# Patient Record
Sex: Male | Born: 1970 | Race: White | Hispanic: No | Marital: Married | Smoking: Current every day smoker
Health system: Southern US, Community
[De-identification: ages and names within clinical notes are randomized; demographics above are authoritative.]

## PROBLEM LIST (undated history)

## (undated) DIAGNOSIS — Z87442 Personal history of urinary calculi: Secondary | ICD-10-CM

## (undated) DIAGNOSIS — E7800 Pure hypercholesterolemia, unspecified: Secondary | ICD-10-CM

## (undated) HISTORY — DX: Pure hypercholesterolemia, unspecified: E78.00

## (undated) HISTORY — PX: CHOLECYSTECTOMY: SHX55

## (undated) MED FILL — Dexamethasone Sodium Phosphate Inj 100 MG/10ML: INTRAMUSCULAR | Qty: 1 | Status: AC

---

## 2004-08-17 ENCOUNTER — Emergency Department (HOSPITAL_COMMUNITY): Admission: EM | Admit: 2004-08-17 | Discharge: 2004-08-17 | Payer: Self-pay | Admitting: Family Medicine

## 2005-11-20 ENCOUNTER — Ambulatory Visit: Payer: Self-pay | Admitting: Endocrinology

## 2006-04-07 ENCOUNTER — Emergency Department (HOSPITAL_COMMUNITY): Admission: EM | Admit: 2006-04-07 | Discharge: 2006-04-07 | Payer: Self-pay | Admitting: Emergency Medicine

## 2006-08-19 ENCOUNTER — Emergency Department (HOSPITAL_COMMUNITY): Admission: EM | Admit: 2006-08-19 | Discharge: 2006-08-19 | Payer: Self-pay | Admitting: Emergency Medicine

## 2006-11-05 ENCOUNTER — Ambulatory Visit: Payer: Self-pay | Admitting: Internal Medicine

## 2007-07-15 ENCOUNTER — Encounter: Payer: Self-pay | Admitting: *Deleted

## 2007-07-15 DIAGNOSIS — Z720 Tobacco use: Secondary | ICD-10-CM | POA: Insufficient documentation

## 2007-07-15 DIAGNOSIS — F17200 Nicotine dependence, unspecified, uncomplicated: Secondary | ICD-10-CM

## 2007-07-15 DIAGNOSIS — E785 Hyperlipidemia, unspecified: Secondary | ICD-10-CM | POA: Insufficient documentation

## 2007-11-08 ENCOUNTER — Ambulatory Visit: Payer: Self-pay | Admitting: Internal Medicine

## 2007-11-08 DIAGNOSIS — F411 Generalized anxiety disorder: Secondary | ICD-10-CM | POA: Insufficient documentation

## 2007-11-11 LAB — CONVERTED CEMR LAB
ALT: 28 units/L (ref 0–53)
AST: 17 units/L (ref 0–37)
Albumin: 4.4 g/dL (ref 3.5–5.2)
Alkaline Phosphatase: 99 units/L (ref 39–117)
BUN: 9 mg/dL (ref 6–23)
Basophils Absolute: 0 10*3/uL (ref 0.0–0.1)
Basophils Relative: 0.1 % (ref 0.0–1.0)
Bilirubin Urine: NEGATIVE
Bilirubin, Direct: 0.1 mg/dL (ref 0.0–0.3)
CO2: 31 meq/L (ref 19–32)
Calcium: 10 mg/dL (ref 8.4–10.5)
Chloride: 103 meq/L (ref 96–112)
Cholesterol: 219 mg/dL (ref 0–200)
Creatinine, Ser: 0.9 mg/dL (ref 0.4–1.5)
Direct LDL: 153.9 mg/dL
Eosinophils Absolute: 0.6 10*3/uL (ref 0.0–0.6)
Eosinophils Relative: 5 % (ref 0.0–5.0)
GFR calc Af Amer: 123 mL/min
GFR calc non Af Amer: 102 mL/min
Glucose, Bld: 95 mg/dL (ref 70–99)
HCT: 47.5 % (ref 39.0–52.0)
HDL: 33.1 mg/dL — ABNORMAL LOW (ref >39.0)
Hemoglobin, Urine: NEGATIVE
Hemoglobin: 16.9 g/dL (ref 13.0–17.0)
Ketones, ur: NEGATIVE mg/dL
Leukocytes, UA: NEGATIVE
Lymphocytes Relative: 29.2 % (ref 12.0–46.0)
MCHC: 35.5 g/dL (ref 30.0–36.0)
MCV: 87.2 fL (ref 78.0–100.0)
Monocytes Absolute: 0.9 10*3/uL — ABNORMAL HIGH (ref 0.2–0.7)
Monocytes Relative: 7.6 % (ref 3.0–11.0)
Neutro Abs: 6.6 10*3/uL (ref 1.4–7.7)
Neutrophils Relative %: 58.1 % (ref 43.0–77.0)
Nitrite: NEGATIVE
Platelets: 204 10*3/uL (ref 150–400)
Potassium: 3.9 meq/L (ref 3.5–5.1)
RBC: 5.45 M/uL (ref 4.22–5.81)
RDW: 12.6 % (ref 11.5–14.6)
Sodium: 141 meq/L (ref 135–145)
Specific Gravity, Urine: 1.01 (ref 1.000–1.03)
TSH: 1.54 microintl units/mL (ref 0.35–5.50)
Total Bilirubin: 0.8 mg/dL (ref 0.3–1.2)
Total CHOL/HDL Ratio: 6.6
Total Protein, Urine: NEGATIVE mg/dL
Total Protein: 7.8 g/dL (ref 6.0–8.3)
Triglycerides: 263 mg/dL (ref 0–149)
Urine Glucose: NEGATIVE mg/dL
Urobilinogen, UA: 0.2 (ref 0.0–1.0)
VLDL: 53 mg/dL — ABNORMAL HIGH (ref 0–40)
WBC: 11.4 10*3/uL — ABNORMAL HIGH (ref 4.5–10.5)
pH: 7 (ref 5.0–8.0)

## 2008-01-12 ENCOUNTER — Emergency Department (HOSPITAL_COMMUNITY): Admission: EM | Admit: 2008-01-12 | Discharge: 2008-01-12 | Payer: Self-pay | Admitting: Emergency Medicine

## 2008-01-15 ENCOUNTER — Inpatient Hospital Stay (HOSPITAL_COMMUNITY): Admission: AD | Admit: 2008-01-15 | Discharge: 2008-01-17 | Payer: Self-pay | Admitting: Internal Medicine

## 2008-01-15 ENCOUNTER — Ambulatory Visit: Payer: Self-pay | Admitting: Endocrinology

## 2008-01-15 DIAGNOSIS — L03211 Cellulitis of face: Secondary | ICD-10-CM | POA: Insufficient documentation

## 2008-01-15 DIAGNOSIS — L0201 Cutaneous abscess of face: Secondary | ICD-10-CM

## 2008-01-16 ENCOUNTER — Ambulatory Visit: Payer: Self-pay | Admitting: Internal Medicine

## 2008-01-20 ENCOUNTER — Encounter: Payer: Self-pay | Admitting: Endocrinology

## 2009-04-05 ENCOUNTER — Emergency Department (HOSPITAL_COMMUNITY): Admission: EM | Admit: 2009-04-05 | Discharge: 2009-04-05 | Payer: Self-pay | Admitting: *Deleted

## 2010-08-26 ENCOUNTER — Ambulatory Visit: Payer: Self-pay | Admitting: Endocrinology

## 2010-08-26 DIAGNOSIS — L738 Other specified follicular disorders: Secondary | ICD-10-CM

## 2010-11-24 NOTE — Assessment & Plan Note (Signed)
Summary: LAST VISIT:  2009--RASH OR PLACE ON NECK--STC   Vital Signs:  Patient profile:   40 year old male Height:      67 inches (170.18 cm) Weight:      136.50 pounds (62.05 kg) BMI:     21.46 O2 Sat:      97 % on Room air Temp:     98.3 degrees F (36.83 degrees C) oral Pulse rate:   74 / minute BP sitting:   118 / 80  (left arm) Cuff size:   regular  Vitals Entered By: Brenton Grills CMA Duncan Dull) (August 26, 2010 10:49 AM)  O2 Flow:  Room air CC: Rash on neck/pt is not currently taking any medications/aj Is Patient Diabetic? No   CC:  Rash on neck/pt is not currently taking any medications/aj.  History of Present Illness: pt states 3 mos of slight rash at the left neck, and assoc itching.    Current Medications (verified): 1)  Paroxetine Hcl 40 Mg  Tabs (Paroxetine Hcl) .Marland Kitchen.. 1 By Mouth Qd 2)  Zocor 80 Mg Tabs (Simvastatin) .Marland Kitchen.. 1 By Mouth Qd 3)  Doxycycline Hyclate 100 Mg  Tabs (Doxycycline Hyclate) .Marland Kitchen.. 1 By Mouth Qd 4)  Sulfamethoxazole-Trimethoprim 800-160 Mg/84ml  Susp (Sulfamethoxazole-Trimethoprim) .... Take 2 Q 12 Hours 5)  Ibuprofen 800 Mg  Tabs (Ibuprofen) .... Take 1 Three Times A Day Qd 6)  Propoxyphene N-Apap 100-650 Mg  Tabs (Propoxyphene N-Apap) .... Take 1 Q 6 Hours  Allergies (verified): 1)  ! Codeine  Past History:  Past Medical History: Last updated: 11/08/2007 Hyperlipidemia Anxiety chronic folliculitis  Social History: Reviewed history from 01/15/2008 and no changes required. Current Smoker Alcohol use-no married works Holiday representative  Review of Systems  The patient denies fever.    Physical Exam  General:  normal appearance.   Neck:  no masses, thyromegaly, or abnormal cervical nodes.  nontender Skin:  mild folliculitis at the left neck.     Impression & Recommendations:  Problem # 1:  FOLLICULITIS (ICD-704.8) mild recurrence  Problem # 2:  SMOKER (ICD-305.1) he would like to quit  Problem # 3:  HYPERLIPIDEMIA  (ICD-272.4) off his meds now  Other Orders: Est. Patient Level II (56213)  Patient Instructions: 1)  resume doxycycline 100 mg once daily.  you should take this indefinitely.   2)  please consider using a quit-smoking product of your choice, and let me know which you would like a prescription for.   it also helps to call (800) quit now.   3)  you should consider resuming a cholesterol medication, and call if you would like a prescription.   4)  return here as needed Prescriptions: DOXYCYCLINE HYCLATE 100 MG  TABS (DOXYCYCLINE HYCLATE) 1 by mouth qd  #60 x 5   Entered and Authorized by:   Minus Breeding MD   Signed by:   Minus Breeding MD on 08/26/2010   Method used:   Electronically to        [facility] (retail)       [address]       [place], [place]  [address]       Ph: [phone]       Fax: [phone]   RxID:   [id number]    Orders Added: 1)  Est. Patient Level II [56387]

## 2011-01-30 LAB — POCT I-STAT, CHEM 8
BUN: 11 mg/dL (ref 6–23)
Calcium, Ion: 1.1 mmol/L — ABNORMAL LOW (ref 1.12–1.32)
Chloride: 106 mEq/L (ref 96–112)
Creatinine, Ser: 1 mg/dL (ref 0.4–1.5)
Glucose, Bld: 103 mg/dL — ABNORMAL HIGH (ref 70–99)
HCT: 49 % (ref 39.0–52.0)
Hemoglobin: 16.7 g/dL (ref 13.0–17.0)
Potassium: 3.5 mEq/L (ref 3.5–5.1)
Sodium: 137 mEq/L (ref 135–145)
TCO2: 22 mmol/L (ref 0–100)

## 2011-01-30 LAB — URINALYSIS, ROUTINE W REFLEX MICROSCOPIC
Bilirubin Urine: NEGATIVE
Glucose, UA: NEGATIVE mg/dL
Hgb urine dipstick: NEGATIVE
Ketones, ur: NEGATIVE mg/dL
Nitrite: NEGATIVE
Protein, ur: NEGATIVE mg/dL
Specific Gravity, Urine: 1.008 (ref 1.005–1.030)
Urobilinogen, UA: 1 mg/dL (ref 0.0–1.0)
pH: 6.5 (ref 5.0–8.0)

## 2011-03-07 NOTE — Discharge Summary (Signed)
Jose Morse, Jose Morse              ACCOUNT NO.:  [id number]   MEDICAL RECORD NO.:  [id number]          PATIENT TYPE:  INP   LOCATION:  1516                         FACILITY:  [facility]   PHYSICIAN:  Rosalyn Gess. Norins, MD  DATE OF BIRTH:  28-Mar-1971   DATE OF ADMISSION:  01/15/2008  DATE OF DISCHARGE:  01/17/2008                               DISCHARGE SUMMARY   ADMISSION DIAGNOSIS:  Facial cellulitis   DISCHARGE DIAGNOSIS:  Facial cellulitis.   CONSULTANTS:  Dr. Annalee Genta per ENT.   PROCEDURE:  Incision and drainage of the facial abscess by Dr.  Annalee Genta.   HISTORY OF PRESENT ILLNESS:  The patient is a 40 year old gentleman who  presented to the office to see Dr. Romero Belling with a 7 day history of  painful nodule to the left malar area.  He had been seen twice by ER  staff both at [facility] and [facility], but had failed oral  antibiotics.  Because of his ongoing pain, discomfort, swelling and  infection, he was admitted to hospital.   Please see the H&P for past medical history, family history and social  history.   HOSPITAL COURSE:  The patient was admitted to the hospital and started  on unison IV.  He was seen in consultation by Dr. Annalee Genta for ENT.  A  bedside incision and drainage was performed.  A wick was placed into the  wound.  The consultant recommended switching the patient to Augmentin XR  and complete outpatient antibiotics.  He is to see the patient in his  office on Monday January 20, 2008.  Patient is to call for appointment.   The patient is being treated with his being afebrile, with his white  count having returned to normal level of 9100, he is felt to be stable  to continue oral antibiotics at home and follow up with ENT as noted.   PHYSICAL EXAMINATION:  VITAL SIGNS:  Temperature 98.1, blood pressure  was 112/73, heart rate 68, respirations 16.  GENERAL APPEARANCE:  This is a slender gentleman in no acute distress.  DERM:  The patient has  a wound on the left malar area with packing in  place.  There is a significant amount of surrounding swelling.  There is  no involvement of the eye structures.  No further examination conducted.   LABORATORY DATA:  CBC from the day of discharge with hemoglobin 13.6 gm,  white count was 9100, platelet count 192,000, TSH was 1.77.  Abscess  cultures were pending at time of this dictation with no growth at 1 day.   DISPOSITION:  The patient is discharged home.   DISCHARGE MEDICATIONS:  1. Augmentin XR b.i.d. for 10 days.  2. He will resume his home medications including Zocor 80 mg daily.  3. Paxil 40 mg daily.  4. Darvocet-N 100 every 6 hours as needed for pain.  5. We will discontinue doxycycline and sulfamethoxazole trimethoprim.   CONDITION ON DISCHARGE:  Stable and improved.      Rosalyn Gess Norins, MD  Electronically Signed     MEN/MEDQ  D:  01/17/2008  T:  01/18/2008  Job:  [id number]   cc:   Gregary Signs A. Everardo All, MD  [address]  [place]  [place] [address]   Kinnie Scales. Annalee Genta, M.D.  Fax: [phone]

## 2011-07-17 LAB — CBC
HCT: 38.7 — ABNORMAL LOW
HCT: 41.2
Hemoglobin: 13.6
Hemoglobin: 14.2
MCHC: 34.5
MCHC: 35.1
MCV: 86.5
MCV: 86.5
Platelets: 179
Platelets: 192
RBC: 4.47
RBC: 4.77
RDW: 13.4
RDW: 13.6
WBC: 13.1 — ABNORMAL HIGH
WBC: 9.1

## 2011-07-17 LAB — ANAEROBIC CULTURE: Gram Stain: NONE SEEN

## 2011-07-17 LAB — COMPREHENSIVE METABOLIC PANEL
ALT: 36
AST: 35
Albumin: 3.7
Alkaline Phosphatase: 100
BUN: 4 — ABNORMAL LOW
CO2: 25
Calcium: 9.3
Chloride: 106
Creatinine, Ser: 0.91
GFR calc Af Amer: >60
GFR calc non Af Amer: >60
Glucose, Bld: 107 — ABNORMAL HIGH
Potassium: 3.5
Sodium: 138
Total Bilirubin: 0.6
Total Protein: 7.2

## 2011-07-17 LAB — URINALYSIS, ROUTINE W REFLEX MICROSCOPIC
Bilirubin Urine: NEGATIVE
Glucose, UA: NEGATIVE
Hgb urine dipstick: NEGATIVE
Ketones, ur: NEGATIVE
Nitrite: NEGATIVE
Protein, ur: NEGATIVE
Specific Gravity, Urine: 1.015
Urobilinogen, UA: 1
pH: 6.5

## 2011-07-17 LAB — CULTURE, ROUTINE-ABSCESS
Culture: NO GROWTH
Gram Stain: NONE SEEN

## 2011-07-17 LAB — CULTURE, BLOOD (ROUTINE X 2)
Culture: NO GROWTH
Culture: NO GROWTH

## 2011-07-17 LAB — TSH: TSH: 1.771

## 2014-12-24 ENCOUNTER — Emergency Department: Payer: Self-pay | Admitting: Emergency Medicine

## 2014-12-31 ENCOUNTER — Ambulatory Visit: Payer: Self-pay | Admitting: Surgery

## 2015-02-15 LAB — SURGICAL PATHOLOGY

## 2015-02-21 NOTE — Op Note (Signed)
PATIENT NAME:  Jose Morse, Jose Morse MR#:  [id number] DATE OF BIRTH:  1971/07/20  DATE OF PROCEDURE:  12/31/2014  PREOPERATIVE DIAGNOSIS: Acute cholecystitis.   POSTOPERATIVE DIAGNOSIS:  Acute cholecystitis.  OPERATION: Laparoscopic cholecystectomy with cholangiography.   SURGEON: Rodena Goldmann, III, MD  ANESTHESIA: General.   OPERATIVE PROCEDURE: With the patient in the supine position after the induction of appropriate general anesthesia, the patient's abdomen was prepped with ChloraPrep and draped with sterile towels. The patient was placed in a head down, feet up position. A small infraumbilical incision was made in the standard fashion. The incision was carried down bluntly through the subcutaneous tissue. A Veress needle was used to cannulate the peritoneal cavity. CO2 was insufflated to appropriate pressure measurements. When approximately 2.5 liters of CO2 were instilled, the Veress needle was withdrawn. An 11 millimeter [employer] port was inserted into the peritoneal cavity. Intraperitoneal position was confirmed. CO2 was re-insufflated. The patient was placed in the head up, feet down position and rotated slightly to the left side. A subxiphoid transverse incision was made and an 11 millimeter port  inserted under direct vision. Two lateral ports, 5 millimeters in size, were inserted under direct vision. The gallbladder was markedly inflamed, thickened, edematous and erythematous. It was elevated superiorly and laterally. An attempt was made to aspirate some bile but there were so many stones and the bile was so thick that I could not adequately aspirate the gallbladder. Dissection was carried out along the hepatoduodenal ligament. The cystic artery and cystic duct were identified. The cystic duct was clipped on the gallbladder side and opened. An on table cholangiogram using dynamic fluoroscopy revealed free flow of dye into the duodenum. No obstruction was seen. Intrahepatic radicles were  visualized. The gallbladder was then doubly clipped on the common duct side and divided. The cystic artery was doubly clipped and divided. The gallbladder was then dissected free from its bed and delivered using hook and the cautery apparatus. There was some spilled bile from the area of attempted aspiration. The gallbladder was captured in EndoCatch apparatus and removed through the subxiphoid incision. The area was irrigated with 2 liters of warm saline solution. Because of the spilled bile, I elected to place a JP drain through a separate stab wound brought out through one of the port sites and placed in the bed of the liver. Secured with 3-0 nylon. The upper midline fascia was closed with figure-of-eight suture of 0 Vicryl using the suture passer. The abdomen was then irrigated and desufflated. Skin incisions were closed with 5-0 nylon. The area was infiltrated with 0.25% Marcaine for postoperative pain control. Sterile dressings were applied. The patient was returned to the recovery room having tolerated the procedure well. Sponge, instrument, and needle count were correct x 2 in the operating room.     ____________________________ Micheline Maze, MD rle:tr D: 12/31/2014 15:52:29 ET T: 12/31/2014 16:02:13 ET JOB#: [id number]  cc: Rodena Goldmann III, MD, <Dictator> Rodena Goldmann MD ELECTRONICALLY SIGNED 01/01/2015 19:27

## 2015-02-21 NOTE — Discharge Summary (Signed)
PATIENT NAME:  Jose Morse, Jose Morse MR#:  [id number] DATE OF BIRTH:  [birth year]  DATE OF ADMISSION:  12/31/2014 DATE OF DISCHARGE:  01/02/2015  BRIEF HISTORY: Jose Morse is a 44 year old gentleman seen in the office with signs and symptoms consistent with acute cholecystitis. He had been evaluated in the Emergency Room and referred to the office for further intervention. After evaluating his clinical presentation and his laboratory workup with his imaging, we recommended surgical intervention. He was admitted to the hospital through the Operating Room on the morning of 12/31/2014 for an elective cholecystectomy. Laparoscopic procedure was accomplished without difficulty. However, he did have a markedly inflamed gallbladder. We left a drain because of the significant inflammatory changes. We admitted him to observation. He had very slow pain control. He had mild nausea, but no significant vomiting. By 01/01/15 he is up, active, tolerating a liquid diet, advanced to soft diet on the morning of 01/02/2015. His wounds look good. There is no sign of any infection. Drainage has decreased. His JP drain was removed. Discharged home today to be followed in the office in 7 to 10 days' time. Bathing, activity, and driving instructions were given to the patient.   DISCHARGE MEDICATIONS: Include Percocet 5/325 every 4 to 6 hours p.r.n. pain.   FINAL DISCHARGE DIAGNOSIS: Acute cholecystitis.   SURGERY: Laparoscopic cholecystectomy.   ____________________________ Rodena Goldmann III, MD rle:ap D: 01/02/2015 06:42:37 ET T: 01/02/2015 16:48:01 ET JOB#: [id number]  cc: Rodena Goldmann III, MD, <Dictator> Rodena Goldmann MD ELECTRONICALLY SIGNED 01/02/2015 19:01

## 2016-01-18 ENCOUNTER — Encounter: Payer: Self-pay | Admitting: Emergency Medicine

## 2016-01-18 ENCOUNTER — Emergency Department
Admission: EM | Admit: 2016-01-18 | Discharge: 2016-01-18 | Disposition: A | Discharge status: Home / Self Care | Payer: Commercial Managed Care - HMO | Attending: Emergency Medicine | Admitting: Emergency Medicine

## 2016-01-18 DIAGNOSIS — F17210 Nicotine dependence, cigarettes, uncomplicated: Secondary | ICD-10-CM | POA: Diagnosis not present

## 2016-01-18 DIAGNOSIS — E785 Hyperlipidemia, unspecified: Secondary | ICD-10-CM | POA: Insufficient documentation

## 2016-01-18 DIAGNOSIS — L0231 Cutaneous abscess of buttock: Secondary | ICD-10-CM | POA: Insufficient documentation

## 2016-01-18 MED ORDER — OXYCODONE HCL 5 MG PO TABS
10.0000 mg | ORAL_TABLET | Freq: Once | ORAL | Status: AC
  Administered 2016-01-18: 10 mg via ORAL
  Filled 2016-01-18: qty 2

## 2016-01-18 MED ORDER — ONDANSETRON 4 MG PO TBDP
4.0000 mg | ORAL_TABLET | Freq: Once | ORAL | Status: AC
  Administered 2016-01-18: 4 mg via ORAL
  Filled 2016-01-18: qty 1

## 2016-01-18 MED ORDER — SULFAMETHOXAZOLE-TRIMETHOPRIM 800-160 MG PO TABS
2.0000 | ORAL_TABLET | Freq: Once | ORAL | Status: AC
  Administered 2016-01-18: 2 via ORAL
  Filled 2016-01-18: qty 2

## 2016-01-18 MED ORDER — CEPHALEXIN 500 MG PO CAPS: 500.0000 mg | ORAL_CAPSULE | Freq: Four times a day (QID) | ORAL | Status: AC

## 2016-01-18 MED ORDER — CEPHALEXIN 500 MG PO CAPS
500.0000 mg | ORAL_CAPSULE | Freq: Once | ORAL | Status: AC
  Administered 2016-01-18: 500 mg via ORAL
  Filled 2016-01-18: qty 1

## 2016-01-18 MED ORDER — HYDROCODONE-ACETAMINOPHEN 5-325 MG PO TABS: 1.0000 | ORAL_TABLET | ORAL | Status: DC | PRN

## 2016-01-18 MED ORDER — SULFAMETHOXAZOLE-TRIMETHOPRIM 800-160 MG PO TABS: 2.0000 | ORAL_TABLET | Freq: Two times a day (BID) | ORAL | Status: DC

## 2016-01-18 MED ORDER — LIDOCAINE-EPINEPHRINE (PF) 1 %-1:200000 IJ SOLN
30.0000 mL | Freq: Once | INTRAMUSCULAR | Status: AC
  Administered 2016-01-18: 30 mL
  Filled 2016-01-18: qty 30

## 2016-01-18 NOTE — Discharge Instructions (Signed)
Return in 2 days for packing removal and recheck of abscess. Use warm compresses 4 times per day to encourage drainage.

## 2016-01-18 NOTE — ED Provider Notes (Signed)
[facility] Emergency Department Provider Note ____________________________________________  Time seen: Approximately 8:35 PM  I have reviewed the triage vital signs and the nursing notes.   HISTORY  Chief Complaint Abscess  HPI Jose Morse is a 45 y.o. male who presents to the emergency department for evaluation abscess to the head is been present off-and-on for the past couple weeks. He states that it will drain and then return to them before. He has been taking Tylenol intermittently with some relief.   History reviewed. No pertinent past medical history.  Patient Active Problem List   Diagnosis Date Noted  . FOLLICULITIS 14/48/1856  . CELLULITIS AND ABSCESS OF FACE 01/15/2008  . ANXIETY 11/08/2007  . HYPERLIPIDEMIA 07/15/2007  . SMOKER 07/15/2007    Past Surgical History  Procedure Laterality Date  . Cholecystectomy      Current Outpatient Rx  Name  Route  Sig  Dispense  Refill  . cephALEXin (KEFLEX) 500 MG capsule   Oral   Take 1 capsule (500 mg total) by mouth 4 (four) times daily.   40 capsule   0   . HYDROcodone-acetaminophen (NORCO/VICODIN) 5-325 MG tablet   Oral   Take 1 tablet by mouth every 4 (four) hours as needed for moderate pain.   20 tablet   0   . sulfamethoxazole-trimethoprim (BACTRIM DS,SEPTRA DS) 800-160 MG tablet   Oral   Take 2 tablets by mouth 2 (two) times daily.   40 tablet   0     Allergies Codeine  No family history on file.  Social History Social History  Substance Use Topics  . Smoking status: Current Every Day Smoker -- 1.00 packs/day    Types: Cigarettes  . Smokeless tobacco: None  . Alcohol Use: No    Review of Systems   Constitutional: No fever/chills Gastrointestinal: No abdominal pain.  No nausea, no vomiting.  No diarrhea. Genitourinary: Negative for dysuria. Musculoskeletal: Negative for pain. Skin: Positive for abscess to the right buttock Neurological: Negative for  headaches, focal weakness or numbness.  ____________________________________________   PHYSICAL EXAM:  VITAL SIGNS: ED Triage Vitals  Enc Vitals Group     BP 01/18/16 2013 134/81 mmHg     Pulse Rate 01/18/16 2013 102     Resp 01/18/16 2013 20     Temp 01/18/16 2013 98.5 F (36.9 C)     Temp Source 01/18/16 2013 Oral     SpO2 01/18/16 2013 100 %     Weight 01/18/16 2013 145 lb (65.772 kg)     Height 01/18/16 2013 '5\' 7"'$  (1.702 m)     Head Cir --      Peak Flow --      Pain Score 01/18/16 2013 7     Pain Loc --      Pain Edu? --      Excl. in [place]? --     Constitutional: Alert and oriented. Well appearing and in no acute distress. Eyes: Conjunctivae are normal. EOMI. Mouth/Throat: Mucous membranes are moist. Respiratory: Normal respiratory effort.  No retractions. Gastrointestinal: Soft and nontender. No distention.  Musculoskeletal: Active ROM x 4 extremities observed. Neurologic:  Normal speech and language. No gross focal neurologic deficits are appreciated. Speech is normal. No gait instability. Skin:  Large, indurated and fluctuant abscess to right buttock;  Psychiatric: Mood and affect are normal. Speech and behavior are normal.  ____________________________________________   LABS (all labs ordered are listed, but only abnormal results are displayed)  Labs Reviewed -  No data to display ____________________________________________  EKG   ____________________________________________  [id number]   ____________________________________________   PROCEDURES  Procedure(s) performed:  INCISION AND DRAINAGE Performed by: Sherrie George Consent: Verbal consent obtained. Risks and benefits: risks, benefits and alternatives were discussed Type: abscess  Body area: Right buttock  Anesthesia: local infiltration  Incision was made with a scalpel.  Local anesthetic: lidocaine 1% with epinephrine  Anesthetic total: 10 ml  Complexity: complex  Blunt  dissection to break up loculations  Drainage: purulent  Drainage amount: Copious.  Packing material: 1/4 in iodoform gauze  Patient tolerance: Patient tolerated the procedure well with no immediate complications.    ____________________________________________   INITIAL IMPRESSION / ASSESSMENT AND PLAN / ED COURSE  Pertinent labs & imaging results that were available during my care of the patient were reviewed by me and considered in my medical decision making (see chart for details).  Patient was instructed to return in 2 days for packing removal and recheck. He was advised to return sooner for symptoms that worsen, such as fever, vomiting, pain or hardness in the scrotum or rectum or increase in pain without drainage from the I&D site. He was instructed to use warm compresses over the area 4 times per day. He was strongly advised to take the Bactrim and Keflex physically as prescribed and without missing any doses. He was advised to take the hydrocodone as prescribed as needed. Wife at bedside during the review of return precautions and instructions on medication administration. Patient verbalizes understanding and agrees to the plan. ____________________________________________   FINAL CLINICAL IMPRESSION(S) / ED DIAGNOSES  Final diagnoses:  Abscess of buttock, right       Victorino Dike, FNP 01/18/16 2154  Eula Listen, MD 01/19/16 1845

## 2016-01-18 NOTE — ED Notes (Signed)
Patient ambulatory to triage with steady gait, without difficulty or distress noted; pt reports abscess to right buttock last couple weeks; st drains but reocurs

## 2016-01-20 ENCOUNTER — Emergency Department
Admission: EM | Admit: 2016-01-20 | Discharge: 2016-01-20 | Disposition: A | Discharge status: Home / Self Care | Payer: Commercial Managed Care - HMO | Attending: Emergency Medicine | Admitting: Emergency Medicine

## 2016-01-20 ENCOUNTER — Encounter: Payer: Self-pay | Admitting: *Deleted

## 2016-01-20 DIAGNOSIS — L03211 Cellulitis of face: Secondary | ICD-10-CM | POA: Insufficient documentation

## 2016-01-20 DIAGNOSIS — Z5189 Encounter for other specified aftercare: Secondary | ICD-10-CM

## 2016-01-20 DIAGNOSIS — Z4800 Encounter for change or removal of nonsurgical wound dressing: Secondary | ICD-10-CM | POA: Insufficient documentation

## 2016-01-20 DIAGNOSIS — E785 Hyperlipidemia, unspecified: Secondary | ICD-10-CM | POA: Insufficient documentation

## 2016-01-20 DIAGNOSIS — Z79899 Other long term (current) drug therapy: Secondary | ICD-10-CM | POA: Diagnosis not present

## 2016-01-20 DIAGNOSIS — F17210 Nicotine dependence, cigarettes, uncomplicated: Secondary | ICD-10-CM | POA: Insufficient documentation

## 2016-01-20 NOTE — ED Provider Notes (Signed)
[facility] Emergency Department Provider Note  ____________________________________________  Time seen: Approximately 6:47 PM  I have reviewed the triage vital signs and the nursing notes.   HISTORY  Chief Complaint Wound Check    HPI Jose Morse is a 45 y.o. male is here for wound recheck of his right buttocks. Patient states that he needs to have the packing removed from his wound. He is continuing to take his antibiotics without any difficulty. He denies any continued problems other than area is still draining.   No past medical history on file.  Patient Active Problem List   Diagnosis Date Noted  . FOLLICULITIS 10/25/7251  . CELLULITIS AND ABSCESS OF FACE 01/15/2008  . ANXIETY 11/08/2007  . HYPERLIPIDEMIA 07/15/2007  . SMOKER 07/15/2007    Past Surgical History  Procedure Laterality Date  . Cholecystectomy      Current Outpatient Rx  Name  Route  Sig  Dispense  Refill  . cephALEXin (KEFLEX) 500 MG capsule   Oral   Take 1 capsule (500 mg total) by mouth 4 (four) times daily.   40 capsule   0   . HYDROcodone-acetaminophen (NORCO/VICODIN) 5-325 MG tablet   Oral   Take 1 tablet by mouth every 4 (four) hours as needed for moderate pain.   20 tablet   0   . sulfamethoxazole-trimethoprim (BACTRIM DS,SEPTRA DS) 800-160 MG tablet   Oral   Take 2 tablets by mouth 2 (two) times daily.   40 tablet   0     Allergies Codeine  No family history on file.  Social History Social History  Substance Use Topics  . Smoking status: Current Every Day Smoker -- 1.00 packs/day    Types: Cigarettes  . Smokeless tobacco: None  . Alcohol Use: No    Review of Systems Constitutional: No fever/chills Respiratory: Denies shortness of breath. Gastrointestinal:  No nausea, no vomiting.  Skin: Positive for wound right buttocks.  10-point ROS otherwise negative.  ____________________________________________   PHYSICAL EXAM:  VITAL  SIGNS: ED Triage Vitals  Enc Vitals Group     BP 01/20/16 1812 137/74 mmHg     Pulse Rate 01/20/16 1812 65     Resp 01/20/16 1812 20     Temp 01/20/16 1812 97.7 F (36.5 C)     Temp Source 01/20/16 1812 Oral     SpO2 01/20/16 1812 99 %     Weight 01/20/16 1812 145 lb (65.772 kg)     Height 01/20/16 1812 '5\' 7"'$  (1.702 m)     Head Cir --      Peak Flow --      Pain Score 01/20/16 1814 2     Pain Loc --      Pain Edu? --      Excl. in [place]? --     Constitutional: Alert and oriented. Well appearing and in no acute distress. Eyes: Conjunctivae are normal. PERRL. EOMI. Head: Atraumatic. Nose: No congestion/rhinnorhea. Respiratory: Normal respiratory effort.  No retractions. Lungs CTAB. Musculoskeletal: Moves upper and lower extremities without any difficulty. Neurologic:  Normal speech and language. No gross focal neurologic deficits are appreciated. No gait instability. Skin:  Skin is warm, dry.  Packing was removed from the I&D site. There is minimal drainage present at this time. There is no extending cellulitis in the area. Psychiatric: Mood and affect are normal. Speech and behavior are normal.  ____________________________________________   LABS (all labs ordered are listed, but only abnormal results are displayed)  Labs Reviewed - No data to display PROCEDURES  Procedure(s) performed: None  Critical Care performed: No  ____________________________________________   INITIAL IMPRESSION / ASSESSMENT AND PLAN / ED COURSE  Pertinent labs & imaging results that were available during my care of the patient were reviewed by me and considered in my medical decision making (see chart for details).  She is continue taking antibiotics until completely finished. He'll follow-up with his care doctor or [facility] if any continued problems. ____________________________________________   FINAL CLINICAL IMPRESSION(S) / ED DIAGNOSES  Final diagnoses:  Wound check, abscess       Johnn Hai, PA-C 01/20/16 2326  Nance Pear, MD 01/22/16 [phone]

## 2016-01-20 NOTE — ED Notes (Signed)

## 2016-01-20 NOTE — Discharge Instructions (Signed)
Soak or use warm compresses to the area Continue taking your antibiotics until completely finished. Follow up with your doctor or [facility] if any continued problems.

## 2016-01-20 NOTE — ED Notes (Signed)
Pt here for packing removal and wound check of right buttock.

## 2016-01-20 NOTE — ED Notes (Signed)
Pt here for wound recheck of right buttock.  Pt needs removal of packing from wound.

## 2016-02-10 ENCOUNTER — Emergency Department
Admission: EM | Admit: 2016-02-10 | Discharge: 2016-02-11 | Disposition: A | Discharge status: Home / Self Care | Payer: Commercial Managed Care - HMO | Attending: Emergency Medicine | Admitting: Emergency Medicine

## 2016-02-10 DIAGNOSIS — L0231 Cutaneous abscess of buttock: Secondary | ICD-10-CM | POA: Diagnosis not present

## 2016-02-10 DIAGNOSIS — F17210 Nicotine dependence, cigarettes, uncomplicated: Secondary | ICD-10-CM | POA: Insufficient documentation

## 2016-02-10 MED ORDER — LIDOCAINE-EPINEPHRINE (PF) 1 %-1:200000 IJ SOLN
10.0000 mL | Freq: Once | INTRAMUSCULAR | Status: AC
  Administered 2016-02-10: 10 mL
  Filled 2016-02-10: qty 30

## 2016-02-10 MED ORDER — SODIUM CHLORIDE 0.9 % IV BOLUS (SEPSIS)
500.0000 mL | Freq: Once | INTRAVENOUS | Status: AC
  Administered 2016-02-10: 500 mL via INTRAVENOUS

## 2016-02-10 MED ORDER — CLINDAMYCIN HCL 300 MG PO CAPS: 300.0000 mg | ORAL_CAPSULE | Freq: Four times a day (QID) | ORAL | Status: DC

## 2016-02-10 MED ORDER — CLINDAMYCIN PHOSPHATE 600 MG/50ML IV SOLN
600.0000 mg | Freq: Once | INTRAVENOUS | Status: AC
  Administered 2016-02-10: 600 mg via INTRAVENOUS
  Filled 2016-02-10: qty 50

## 2016-02-10 NOTE — ED Provider Notes (Signed)
[facility] Emergency Department Provider Note  ____________________________________________  Time seen: Approximately 9:15 PM  I have reviewed the triage vital signs and the nursing notes.   HISTORY  Chief Complaint Abscess    HPI Jose Morse is a 45 y.o. male who presents emergency department complaining of an abscess to the right buttocks. Patient states that he had an abscess in this area approximately 3 weeks ago. Patient states that he was seen in this department, it was drained, and he was placed on 2 antibiotics. Patient states that area healed appropriately and he finished his course of antibiotics. Approximately a week after finishing last antibiotics he noticed increasing redness and tenderness to that area. Patient reports over the last 24 hours this area has greatly increased in size, tenderness, and is now "squishy" to him pushing on it. Patient denies any drainage from the area. He denies any fevers or chills. He denies any nausea or vomiting. Patient does not have a history of recurrent skin infections prior to this previous infection in this area.   No past medical history on file.  Patient Active Problem List   Diagnosis Date Noted  . FOLLICULITIS 34/28/7681  . CELLULITIS AND ABSCESS OF FACE 01/15/2008  . ANXIETY 11/08/2007  . HYPERLIPIDEMIA 07/15/2007  . SMOKER 07/15/2007    Past Surgical History  Procedure Laterality Date  . Cholecystectomy      Current Outpatient Rx  Name  Route  Sig  Dispense  Refill  . clindamycin (CLEOCIN) 300 MG capsule   Oral   Take 1 capsule (300 mg total) by mouth 4 (four) times daily.   40 capsule   0   . HYDROcodone-acetaminophen (NORCO/VICODIN) 5-325 MG tablet   Oral   Take 1 tablet by mouth every 4 (four) hours as needed for moderate pain.   20 tablet   0   . sulfamethoxazole-trimethoprim (BACTRIM DS,SEPTRA DS) 800-160 MG tablet   Oral   Take 2 tablets by mouth 2 (two) times daily.   40  tablet   0     Allergies Codeine  No family history on file.  Social History Social History  Substance Use Topics  . Smoking status: Current Every Day Smoker -- 1.00 packs/day    Types: Cigarettes  . Smokeless tobacco: Not on file  . Alcohol Use: No     Review of Systems  Constitutional: No fever/chills Cardiovascular: no chest pain. Respiratory: no cough. No SOB. Gastrointestinal: No abdominal pain.  No nausea, no vomiting.   Musculoskeletal: Negative for musculoskeletal pain. Skin: Negative for rash. Positive for abscess to the right buttocks. Neurological: Negative for headaches, focal weakness or numbness. 10-point ROS otherwise negative.  ____________________________________________   PHYSICAL EXAM:  VITAL SIGNS: ED Triage Vitals  Enc Vitals Group     BP 02/10/16 2053 134/86 mmHg     Pulse Rate 02/10/16 2053 95     Resp 02/10/16 2053 18     Temp 02/10/16 2053 99.4 F (37.4 C)     Temp Source 02/10/16 2053 Oral     SpO2 02/10/16 2053 97 %     Weight 02/10/16 2053 145 lb (65.772 kg)     Height 02/10/16 2053 '5\' 7"'$  (1.702 m)     Head Cir --      Peak Flow --      Pain Score 02/10/16 2054 1     Pain Loc --      Pain Edu? --      Excl. in  [place]? --      Constitutional: Alert and oriented. Well appearing and in no acute distress. Eyes: Conjunctivae are normal. PERRL. EOMI. Head: Atraumatic. Cardiovascular: Normal rate, regular rhythm. Normal S1 and S2.  Good peripheral circulation. Respiratory: Normal respiratory effort without tachypnea or retractions. Lungs CTAB. Musculoskeletal: Full range of motion all extremity. Neurologic:  Normal speech and language. No gross focal neurologic deficits are appreciated.  Skin:  Skin is warm, dry and intact. No rash noted. Abscesses noted to the right buttocks. There is surrounding erythema and edema. Total erythematous margin is approximately 10 cm in diameter. Area is very fluctuant. Area is very tender to palpation.  This is located in the very center of the buttocks. No anal/rectal involvement is suspected. Psychiatric: Mood and affect are normal. Speech and behavior are normal. Patient exhibits appropriate insight and judgement.   ____________________________________________   LABS (all labs ordered are listed, but only abnormal results are displayed)  Labs Reviewed  WOUND CULTURE   ____________________________________________  EKG   ____________________________________________  RADIOLOGY   No results found.  ____________________________________________    PROCEDURES  Procedure(s) performed:    INCISION AND DRAINAGE Performed by: Charline Bills Rohil Lesch Consent: Verbal consent obtained. Risks and benefits: risks, benefits and alternatives were discussed Type: abscess  Body area: Right buttocks  Anesthesia: local infiltration  Incision was made with a scalpel.  Local anesthetic: lidocaine 1 % with epinephrine  Anesthetic total: 8 ml  Complexity: complex Blunt dissection to break up loculations  Drainage: purulent  Drainage amount: Large  Packing material: 1/4 in iodoform gauze  Patient tolerance: Patient tolerated the procedure well with no immediate complications.      Medications  sodium chloride 0.9 % bolus 500 mL (0 mLs Intravenous Stopped 02/10/16 2337)  clindamycin (CLEOCIN) IVPB 600 mg (0 mg Intravenous Stopped 02/10/16 2300)  lidocaine-EPINEPHrine (XYLOCAINE-EPINEPHrine) 1 %-1:200000 (PF) injection 10 mL (10 mLs Infiltration Given by Other 02/10/16 2130)     ____________________________________________   INITIAL IMPRESSION / ASSESSMENT AND PLAN / ED COURSE  Pertinent labs & imaging results that were available during my care of the patient were reviewed by me and considered in my medical decision making (see chart for details).  Patient's diagnosis is consistent with abscess to the right buttocks. Patient had a history of same approximately 3 weeks  ago. He was placed on both Keflex and Bactrim. Well area did initially hear up there is a return of symptoms within 2 weeks of finishing antibiotics. At this time, the patient is given IV antibiotics and was placed on oral clindamycin at home. Wound culture is obtained and sent for analysis. Patient is to follow-up with this department in 2 days for wound recheck or sooner if necessary. Patient will be informed of his wound culture when it returns and antibiotics will be adjusted if necessary. Patient will be given a prescription for pain medication in addition to the antibiotic.Marland KitchenPatient is given ED precautions to return to the ED for any worsening or new symptoms.     ____________________________________________  FINAL CLINICAL IMPRESSION(S) / ED DIAGNOSES  Final diagnoses:  Abscess of buttock, right      NEW MEDICATIONS STARTED DURING THIS VISIT:  New Prescriptions   CLINDAMYCIN (CLEOCIN) 300 MG CAPSULE    Take 1 capsule (300 mg total) by mouth 4 (four) times daily.        This chart was dictated using voice recognition software/Dragon. Despite best efforts to proofread, errors can occur which can change the meaning. Any change  was purely unintentional.    Darletta Moll, PA-C 02/11/16 0008  Earleen Newport, MD 02/12/16 [phone]

## 2016-02-10 NOTE — ED Notes (Signed)
Pt in via triage with complaints of abscess to right buttock; pt reports site was lanced and drained 3-4 weeks ago.  Pt reports new swelling to same site x 1 day.  Area to right buttocks discolored, swollen, tender.

## 2016-02-10 NOTE — Discharge Instructions (Signed)
Abscess °An abscess is an infected area that contains a collection of pus and debris. It can occur in almost any part of the body. An abscess is also known as a furuncle or boil. °CAUSES  °An abscess occurs when tissue gets infected. This can occur from blockage of oil or sweat glands, infection of hair follicles, or a minor injury to the skin. As the body tries to fight the infection, pus collects in the area and creates pressure under the skin. This pressure causes pain. People with weakened immune systems have difficulty fighting infections and get certain abscesses more often.  °SYMPTOMS °Usually an abscess develops on the skin and becomes a painful mass that is red, warm, and tender. If the abscess forms under the skin, you may feel a moveable soft area under the skin. Some abscesses break open (rupture) on their own, but most will continue to get worse without care. The infection can spread deeper into the body and eventually into the bloodstream, causing you to feel ill.  °DIAGNOSIS  °Your caregiver will take your medical history and perform a physical exam. A sample of fluid may also be taken from the abscess to determine what is causing your infection. °TREATMENT  °Your caregiver may prescribe antibiotic medicines to fight the infection. However, taking antibiotics alone usually does not cure an abscess. Your caregiver may need to make a small cut (incision) in the abscess to drain the pus. In some cases, gauze is packed into the abscess to reduce pain and to continue draining the area. °HOME CARE INSTRUCTIONS  °· Only take over-the-counter or prescription medicines for pain, discomfort, or fever as directed by your caregiver. °· If you were prescribed antibiotics, take them as directed. Finish them even if you start to feel better. °· If gauze is used, follow your caregiver's directions for changing the gauze. °· To avoid spreading the infection: °· Keep your draining abscess covered with a  bandage. °· Wash your hands well. °· Do not share personal care items, towels, or whirlpools with others. °· Avoid skin contact with others. °· Keep your skin and clothes clean around the abscess. °· Keep all follow-up appointments as directed by your caregiver. °SEEK MEDICAL CARE IF:  °· You have increased pain, swelling, redness, fluid drainage, or bleeding. °· You have muscle aches, chills, or a general ill feeling. °· You have a fever. °MAKE SURE YOU:  °· Understand these instructions. °· Will watch your condition. °· Will get help right away if you are not doing well or get worse. °  °This information is not intended to replace advice given to you by your health care provider. Make sure you discuss any questions you have with your health care provider. °  °Document Released: 07/19/2005 Document Revised: 04/09/2012 Document Reviewed: 12/22/2011 °Elsevier Interactive Patient Education ©2016 [employer] ° °Incision and Drainage °Incision and drainage is a procedure in which a sac-like structure (cystic structure) is opened and drained. The area to be drained usually contains material such as pus, fluid, or blood.  °LET YOUR CAREGIVER KNOW ABOUT:  °· Allergies to medicine. °· Medicines taken, including vitamins, herbs, eyedrops, over-the-counter medicines, and creams. °· Use of steroids (by mouth or creams). °· Previous problems with anesthetics or numbing medicines. °· History of bleeding problems or blood clots. °· Previous surgery. °· Other health problems, including diabetes and kidney problems. °· Possibility of pregnancy, if this applies. °RISKS AND COMPLICATIONS °· Pain. °· Bleeding. °· Scarring. °· Infection. °BEFORE THE PROCEDURE  °  You may need to have an ultrasound or other imaging tests to see how large or deep your cystic structure is. Blood tests may also be used to determine if you have an infection or how severe the infection is. You may need to have a tetanus shot. °PROCEDURE  °The affected area  is cleaned with a cleaning fluid. The cyst area will then be numbed with a medicine (local anesthetic). A small incision will be made in the cystic structure. A syringe or catheter may be used to drain the contents of the cystic structure, or the contents may be squeezed out. The area will then be flushed with a cleansing solution. After cleansing the area, it is often gently packed with a gauze or another wound dressing. Once it is packed, it will be covered with gauze and tape or some other type of wound dressing.  °AFTER THE PROCEDURE  °· Often, you will be allowed to go home right after the procedure. °· You may be given antibiotic medicine to prevent or heal an infection. °· If the area was packed with gauze or some other wound dressing, you will likely need to come back in 1 to 2 days to get it removed. °· The area should heal in about 14 days. °  °This information is not intended to replace advice given to you by your health care provider. Make sure you discuss any questions you have with your health care provider. °  °Document Released: 04/04/2001 Document Revised: 04/09/2012 Document Reviewed: 12/04/2011 °Elsevier Interactive Patient Education ©2016 Elsevier Inc. ° °

## 2016-02-10 NOTE — ED Notes (Signed)
Pt in with abscess to right buttocks hx of the same 1 month ago.

## 2016-02-10 NOTE — ED Notes (Signed)
Pt with concerns of soiled/wet clothing r/t I&D performed by PA to drain abscess.  Pt given bag for personal clothing/belongings and provided 1 pair of disposable scrub pants and undergarment.

## 2016-02-12 ENCOUNTER — Encounter: Payer: Self-pay | Admitting: Emergency Medicine

## 2016-02-12 ENCOUNTER — Emergency Department
Admission: EM | Admit: 2016-02-12 | Discharge: 2016-02-12 | Disposition: A | Discharge status: Home / Self Care | Payer: Commercial Managed Care - HMO | Attending: Emergency Medicine | Admitting: Emergency Medicine

## 2016-02-12 DIAGNOSIS — Z4800 Encounter for change or removal of nonsurgical wound dressing: Secondary | ICD-10-CM | POA: Diagnosis not present

## 2016-02-12 DIAGNOSIS — L0231 Cutaneous abscess of buttock: Secondary | ICD-10-CM | POA: Insufficient documentation

## 2016-02-12 DIAGNOSIS — F17210 Nicotine dependence, cigarettes, uncomplicated: Secondary | ICD-10-CM | POA: Diagnosis not present

## 2016-02-12 DIAGNOSIS — Z09 Encounter for follow-up examination after completed treatment for conditions other than malignant neoplasm: Secondary | ICD-10-CM

## 2016-02-12 NOTE — Discharge Instructions (Signed)
Incision and Drainage, Care After Refer to this sheet in the next few weeks. These instructions provide you with information on caring for yourself after your procedure. Your caregiver may also give you more specific instructions. Your treatment has been planned according to current medical practices, but problems sometimes occur. Call your caregiver if you have any problems or questions after your procedure. HOME CARE INSTRUCTIONS   If antibiotic medicine is given, take it as directed. Finish it even if you start to feel better.  Only take over-the-counter or prescription medicines for pain, discomfort, or fever as directed by your caregiver.  Keep all follow-up appointments as directed by your caregiver.  Change any bandages (dressings) as directed by your caregiver. Replace old dressings with clean dressings.  Wash your hands before and after caring for your wound. You will receive specific instructions for cleansing and caring for your wound.  SEEK MEDICAL CARE IF:   You have increased pain, swelling, or redness around the wound.  You have increased drainage, smell, or bleeding from the wound.  You have muscle aches, chills, or you feel generally sick.  You have a fever. MAKE SURE YOU:   Understand these instructions.  Will watch your condition.  Will get help right away if you are not doing well or get worse.   This information is not intended to replace advice given to you by your health care provider. Make sure you discuss any questions you have with your health care provider.   Document Released: 01/01/2012 Document Revised: 10/30/2014 Document Reviewed: 01/01/2012 Elsevier Interactive Patient Education 2016 Elsevier Inc.  

## 2016-02-12 NOTE — ED Notes (Signed)
Patient presents to ED for a recheck of an abscess that was drained on Thursday. Patient denies any pain or problems with site.

## 2016-02-12 NOTE — ED Provider Notes (Signed)
[facility] Emergency Department Provider Note  ____________________________________________  Time seen: Approximately 7:04 PM  I have reviewed the triage vital signs and the nursing notes.   HISTORY  Chief Complaint Follow-up    HPI Jose Morse is a 45 y.o. male who returns to emergency department for recheck of his abscess. The patient was seen by myself 2 days prior was diagnosed with abscess to the right buttocks. This was drained in the emergency department. Patient was to follow-up with this provider for further evaluation today. Patient reports that pain is improving, surrounding erythema and edema is improving, there is been minimal drainage at this time. Packing is still in place. Patient denies any fevers or chills, nausea or vomiting, abdominal pain. Patient is still taking his clindamycin as prescribed.   History reviewed. No pertinent past medical history.  Patient Active Problem List   Diagnosis Date Noted  . FOLLICULITIS 94/70/9628  . CELLULITIS AND ABSCESS OF FACE 01/15/2008  . ANXIETY 11/08/2007  . HYPERLIPIDEMIA 07/15/2007  . SMOKER 07/15/2007    Past Surgical History  Procedure Laterality Date  . Cholecystectomy      Current Outpatient Rx  Name  Route  Sig  Dispense  Refill  . clindamycin (CLEOCIN) 300 MG capsule   Oral   Take 1 capsule (300 mg total) by mouth 4 (four) times daily.   40 capsule   0   . HYDROcodone-acetaminophen (NORCO/VICODIN) 5-325 MG tablet   Oral   Take 1 tablet by mouth every 4 (four) hours as needed for moderate pain.   20 tablet   0   . sulfamethoxazole-trimethoprim (BACTRIM DS,SEPTRA DS) 800-160 MG tablet   Oral   Take 2 tablets by mouth 2 (two) times daily.   40 tablet   0     Allergies Codeine  History reviewed. No pertinent family history.  Social History Social History  Substance Use Topics  . Smoking status: Current Every Day Smoker -- 1.00 packs/day    Types: Cigarettes  .  Smokeless tobacco: None  . Alcohol Use: No     Review of Systems  Constitutional: No fever/chills Cardiovascular: no chest pain. Respiratory: no cough. No SOB. Gastrointestinal: No abdominal pain.  No nausea, no vomiting.   Musculoskeletal: Negative for Musculoskeletal pain Skin: Negative for rash. Positive for right buttocks abscess. Neurological: Negative for headaches, focal weakness or numbness. 10-point ROS otherwise negative.  ____________________________________________   PHYSICAL EXAM:  VITAL SIGNS: ED Triage Vitals  Enc Vitals Group     BP 02/12/16 1849 147/83 mmHg     Pulse Rate 02/12/16 1849 76     Resp 02/12/16 1849 17     Temp 02/12/16 1849 99 F (37.2 C)     Temp Source 02/12/16 1849 Oral     SpO2 02/12/16 1849 97 %     Weight 02/12/16 1849 145 lb (65.772 kg)     Height 02/12/16 1849 '5\' 7"'$  (1.702 m)     Head Cir --      Peak Flow --      Pain Score 02/12/16 1850 0     Pain Loc --      Pain Edu? --      Excl. in [place]? --      Constitutional: Alert and oriented. Well appearing and in no acute distress. Eyes: Conjunctivae are normal. PERRL. EOMI. Head: Atraumatic. Cardiovascular: Normal rate, regular rhythm. Normal S1 and S2.  Good peripheral circulation. Respiratory: Normal respiratory effort without tachypnea or retractions. Lungs  CTAB. Neurologic:  Normal speech and language. No gross focal neurologic deficits are appreciated.  Skin:  Skin is warm, dry and intact. No rash noted.Incised abscess is visualized to the right buttocks. Erythema and edema is minimal at this time. This is a great improvement from 2 days prior. Packing is still in place. Minimal purulent drainage is noted on packing material. No active pustular drainage or bleeding. Area is only mildly tender to palpation at this time. Psychiatric: Mood and affect are normal. Speech and behavior are normal. Patient exhibits appropriate insight and  judgement.   ____________________________________________   LABS (all labs ordered are listed, but only abnormal results are displayed)  Labs Reviewed - No data to display ____________________________________________  EKG   ____________________________________________  RADIOLOGY   No results found.  ____________________________________________    PROCEDURES  Procedure(s) performed:       Medications - No data to display   ____________________________________________   INITIAL IMPRESSION / ASSESSMENT AND PLAN / ED COURSE  Pertinent labs & imaging results that were available during my care of the patient were reviewed by me and considered in my medical decision making (see chart for details).  Patient's diagnosis is consistent with wound recheck of abscess. There is good symptomatic improvement. Packing is removed. At this time no new packing will be placed due to improvement of abscess site. Wound culture was obtained at previous visit and has not resulted. At this time patient will be maintained on clindamycin as there is good symptomatic improvement while on this medication. If the culture should return with bacteria non-susceptible to clindamycin we will change it at that time.. Patient is to continue oral clindamycin at home. Patient is given strict ED precautions to return for increasing pain, increasing drainage, increasing erythema or edema, nausea or vomiting, abdominal pain, fever or chills. Otherwise, patient does not need a follow-up visit in this department. Patient is given ED precautions to return to the ED for any worsening or new symptoms.     ____________________________________________  FINAL CLINICAL IMPRESSION(S) / ED DIAGNOSES  Final diagnoses:  Encounter for recheck of abscess following incision and drainage      NEW MEDICATIONS STARTED DURING THIS VISIT:  New Prescriptions   No medications on file        This chart was  dictated using voice recognition software/Dragon. Despite best efforts to proofread, errors can occur which can change the meaning. Any change was purely unintentional.    Darletta Moll, PA-C 02/12/16 1913  Carrie Mew, MD 02/14/16 0021

## 2016-02-14 LAB — WOUND CULTURE: SPECIAL REQUESTS: NORMAL

## 2016-07-18 ENCOUNTER — Emergency Department
Admission: EM | Admit: 2016-07-18 | Discharge: 2016-07-18 | Disposition: A | Discharge status: Home / Self Care | Payer: Commercial Managed Care - HMO | Attending: Emergency Medicine | Admitting: Emergency Medicine

## 2016-07-18 ENCOUNTER — Encounter: Payer: Self-pay | Admitting: Emergency Medicine

## 2016-07-18 DIAGNOSIS — L0231 Cutaneous abscess of buttock: Secondary | ICD-10-CM | POA: Diagnosis not present

## 2016-07-18 DIAGNOSIS — F17210 Nicotine dependence, cigarettes, uncomplicated: Secondary | ICD-10-CM | POA: Diagnosis not present

## 2016-07-18 MED ORDER — SULFAMETHOXAZOLE-TRIMETHOPRIM 800-160 MG PO TABS
1.0000 | ORAL_TABLET | Freq: Once | ORAL | Status: AC
  Administered 2016-07-18: 1 via ORAL
  Filled 2016-07-18: qty 1

## 2016-07-18 MED ORDER — SULFAMETHOXAZOLE-TRIMETHOPRIM 800-160 MG PO TABS: 1.0000 | ORAL_TABLET | Freq: Two times a day (BID) | ORAL | 0 refills | Status: DC

## 2016-07-18 MED ORDER — HYDROCODONE-ACETAMINOPHEN 5-325 MG PO TABS
1.0000 | ORAL_TABLET | Freq: Once | ORAL | Status: AC
  Administered 2016-07-18: 1 via ORAL
  Filled 2016-07-18: qty 1

## 2016-07-18 MED ORDER — HYDROCODONE-ACETAMINOPHEN 5-325 MG PO TABS: 1.0000 | ORAL_TABLET | Freq: Four times a day (QID) | ORAL | 0 refills | Status: DC | PRN

## 2016-07-18 MED ORDER — LIDOCAINE-EPINEPHRINE (PF) 1 %-1:200000 IJ SOLN
30.0000 mL | Freq: Once | INTRAMUSCULAR | Status: AC
  Administered 2016-07-18: 30 mL via INTRADERMAL
  Filled 2016-07-18: qty 30

## 2016-07-18 NOTE — Discharge Instructions (Addendum)
Keep the area clean, dry, and covered. Apply warm compress to the dressing to promote healing. Take the antibiotic as directed until completed. The pain medicine as needed for pain relief. Return to the ED in 3 days for wound check and packing removal. Selected local primary care provider for routine medical care. 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

## 2016-07-18 NOTE — ED Triage Notes (Addendum)
Pt presents to ED with reoccuring abscess to his "right butt cheek". denies drainage. Hx of the same. Onset Monday.

## 2016-07-18 NOTE — ED Notes (Signed)
Pt has abscess on right buttocks - area has been present since Monday - area is not draining at this time - pt states this area has been lanced several times already in the er but keeps coming back

## 2016-07-18 NOTE — ED Provider Notes (Signed)
[facility] Emergency Department Provider Note ____________________________________________  Time seen: 2030  I have reviewed the triage vital signs and the nursing notes.  HISTORY  Chief Complaint  Abscess  HPI Jose Morse is a 45 y.o. male who presents to the emergency department with complaint of "abscess" of the right buttock x 3 days.  Patient states that he has pain, redness, and swelling of an area on his right buttock that has been progressing.  He denies any open wounds or drainage.  He states that putting pressure on the area, such as when he sits, increases pain. He denies fever, chills, nausea, vomiting, changes in urination or bowel movements.  Patient has not been taking any medications for the pain or swelling.  He has not tried heating or icing the area.  Patient denies recent injury to the area.  Patient states that he has had abscesses in the same location twice before, with the most recent abscess being "lanced" 3 or 4 months ago. He states he took Bactrim at that time without adverse reaction.    History reviewed. No pertinent past medical history.  Patient Active Problem List   Diagnosis Date Noted  . FOLLICULITIS 31/51/7616  . CELLULITIS AND ABSCESS OF FACE 01/15/2008  . ANXIETY 11/08/2007  . HYPERLIPIDEMIA 07/15/2007  . SMOKER 07/15/2007    Past Surgical History:  Procedure Laterality Date  . CHOLECYSTECTOMY      Prior to Admission medications   Medication Sig Start Date End Date Taking? Authorizing Provider  HYDROcodone-acetaminophen (NORCO) 5-325 MG tablet Take 1 tablet by mouth every 6 (six) hours as needed. 07/18/16   Carline Dura V Bacon Javeon Macmurray, PA-C  sulfamethoxazole-trimethoprim (BACTRIM DS,SEPTRA DS) 800-160 MG tablet Take 1 tablet by mouth 2 (two) times daily. 07/18/16   Kensly Bowmer V Bacon Shandale Malak, PA-C    Allergies Codeine  No family history on file.  Social History Social History  Substance Use Topics  . Smoking status:  Current Every Day Smoker    Packs/day: 1.00    Types: Cigarettes  . Smokeless tobacco: Never Used  . Alcohol use No   Review of Systems  Constitutional: Negative for fever, chills. Cardiovascular: Negative for chest pain. Respiratory: Negative for shortness of breath. Gastrointestinal: Negative for abdominal pain, vomiting and diarrhea. Genitourinary: Negative for dysuria. Musculoskeletal: Negative for back pain. Skin: Negative for rash. Positive for redness, swelling of right buttock. Neurological: Negative for focal weakness or numbness. ____________________________________________  PHYSICAL EXAM:  VITAL SIGNS: ED Triage Vitals  Enc Vitals Group     BP 07/18/16 1932 (!) 135/91     Pulse Rate 07/18/16 1932 84     Resp 07/18/16 1932 16     Temp 07/18/16 1932 98 F (36.7 C)     Temp Source 07/18/16 1932 Oral     SpO2 07/18/16 1932 95 %     Weight 07/18/16 1926 145 lb (65.8 kg)     Height 07/18/16 1926 '5\' 7"'$  (1.702 m)     Head Circumference --      Peak Flow --      Pain Score 07/18/16 1926 3     Pain Loc --      Pain Edu? --      Excl. in [place]? --     Constitutional: Alert and oriented. Well appearing and in no distress. Head: Normocephalic and atraumatic. Cardiovascular: Normal rate, regular rhythm. Good peripheral circulation. Respiratory: Normal respiratory effort.  Gastrointestinal: Soft and nontender. No distention. Musculoskeletal: Normal range of motion  in all extremities. Tenderness across right buttock. Neurologic:  Normal gait without ataxia. Normal speech and language. No gross focal neurologic deficits are appreciated. Skin:  Skin is warm, dry and intact. Erythematous area of ~10 cm x 7 cm on right buttock, with induration and small area of fluctuance at center.  No open wounds or drainage.   ____________________________________________  PROCEDURES  1 tablet Septra DS ('800mg'$ -'160mg'$ ) PO 1 tablet Vicodin ('5mg'$ -'325mg'$ ) PO  INCISION AND DRAINAGE Authorized  by: Melvenia Needles  Performed by: Geryl Rankins, PA-S Tyler Deis) Consent: Verbal consent obtained. Risks and benefits: risks, benefits and alternatives were discussed Type: abscess  Body area: Right buttock  Anesthesia: local infiltration  Incision was made with a scalpel.  Local anesthetic: lidocaine 1% with epinephrine  Anesthetic total: 5 ml  Complexity: complex Blunt dissection to break up loculations  Drainage: purulent  Drainage amount: moderate  Packing material: 1/4 in iodoform gauze  Patient tolerance: Patient tolerated the procedure well with no immediate complications. ____________________________________________  INITIAL IMPRESSION / ASSESSMENT AND PLAN / ED COURSE  Patient's diagnosis consistent with abscess of the right buttock.  Abscess was drained and packed without complications.  Patient will receive prescriptions for Septra DS and Vicodin.  Recommended application of a warm compress to the dressing to promote healing.  He was instructed to return to the ED in 3 days for wound check and packing removal.  Patient was told to follow up with a general surgery group for evaluation of recurrent abscess of the right buttock.   Clinical Course   ____________________________________________  FINAL CLINICAL IMPRESSION(S) / ED DIAGNOSES  Final diagnoses:  Abscess of buttock, right     Melvenia Needles, PA-C 07/19/16 [place] Quigley, MD 07/20/16 [phone]

## 2016-07-21 ENCOUNTER — Emergency Department
Admission: EM | Admit: 2016-07-21 | Discharge: 2016-07-21 | Disposition: A | Discharge status: Home / Self Care | Payer: Commercial Managed Care - HMO | Attending: Emergency Medicine | Admitting: Emergency Medicine

## 2016-07-21 ENCOUNTER — Encounter: Payer: Self-pay | Admitting: Medical Oncology

## 2016-07-21 DIAGNOSIS — F17210 Nicotine dependence, cigarettes, uncomplicated: Secondary | ICD-10-CM | POA: Insufficient documentation

## 2016-07-21 DIAGNOSIS — Z09 Encounter for follow-up examination after completed treatment for conditions other than malignant neoplasm: Secondary | ICD-10-CM

## 2016-07-21 DIAGNOSIS — Z4801 Encounter for change or removal of surgical wound dressing: Secondary | ICD-10-CM | POA: Insufficient documentation

## 2016-07-21 NOTE — ED Provider Notes (Signed)
[facility] Emergency Department Provider Note  ____________________________________________  Time seen: Approximately 7:37 PM  I have reviewed the triage vital signs and the nursing notes.   HISTORY  Chief Complaint Follow-up    HPI Jose Morse is a 45 y.o. male , NAD, presents emergency department for recheck of abscess after incision and drainage. Patient states he was seen in this emergency department 3 days ago for incision and drainage of a right buttock abscess. States he has been taking his medications as prescribed without side effects or difficulty. Has not noted any new rashes or skin sores. Has not had any numbness or tingling at the incisional site. Notes that pain about the site has significantly decreased. Denies fevers, chills, body aches, abdominal pain, nausea, vomiting. Patient does note that this is the third time and abscess about the buttock has occurred. He was referred to Gen. surgery at his last visit but has yet to make an appointment at this time. Is here for recheck only and has no other complaints to be evaluated.   No past medical history on file.  Patient Active Problem List   Diagnosis Date Noted  . FOLLICULITIS 79/39/0300  . CELLULITIS AND ABSCESS OF FACE 01/15/2008  . ANXIETY 11/08/2007  . HYPERLIPIDEMIA 07/15/2007  . SMOKER 07/15/2007    Past Surgical History:  Procedure Laterality Date  . CHOLECYSTECTOMY      Prior to Admission medications   Medication Sig Start Date End Date Taking? Authorizing Provider  HYDROcodone-acetaminophen (NORCO) 5-325 MG tablet Take 1 tablet by mouth every 6 (six) hours as needed. 07/18/16   Jenise V Bacon Menshew, PA-C  sulfamethoxazole-trimethoprim (BACTRIM DS,SEPTRA DS) 800-160 MG tablet Take 1 tablet by mouth 2 (two) times daily. 07/18/16   Jenise V Bacon Menshew, PA-C    Allergies Codeine  No family history on file.  Social History Social History  Substance Use Topics  .  Smoking status: Current Every Day Smoker    Packs/day: 1.00    Types: Cigarettes  . Smokeless tobacco: Never Used  . Alcohol use No     Review of Systems  Constitutional: No fever/chills Gastrointestinal: No abdominal pain.  No nausea, vomiting.   Musculoskeletal: Negative for general myalgias.  Skin: Positive skin sore right buttock with packing in place. Negative for new skin sores, oozing, weeping. Neurological: Negative for numbness, tingling. 10-point ROS otherwise negative.  ____________________________________________   PHYSICAL EXAM:  VITAL SIGNS: ED Triage Vitals  Enc Vitals Group     BP 07/21/16 1827 129/80     Pulse Rate 07/21/16 1827 73     Resp 07/21/16 1827 18     Temp 07/21/16 1827 98.3 F (36.8 C)     Temp Source 07/21/16 1827 Oral     SpO2 07/21/16 1827 95 %     Weight 07/21/16 1827 145 lb (65.8 kg)     Height 07/21/16 1827 '5\' 7"'$  (1.702 m)     Head Circumference --      Peak Flow --      Pain Score 07/21/16 1828 3     Pain Loc --      Pain Edu? --      Excl. in [place]? --      Constitutional: Alert and oriented. Well appearing and in no acute distress. Eyes: Conjunctivae are normal.  Head: Atraumatic. Respiratory: Normal respiratory effort without tachypnea or retractions.  Musculoskeletal: Full range of motion of bilateral lower extremities without pain or difficulty. Neurologic:  Normal speech  and language. No gross focal neurologic deficits are appreciated.  Skin:  Incisional wound about the right buttock with packing in place. Once packing was removed wound was cleaned with good granulation tissue. Healing well. No active oozing or weeping. Mild induration is noted surrounding the wound but no significant erythema or warmth is noted. Patient has no tenderness to palpation. Skin is warm, dry. No rash noted. Psychiatric: Mood and affect are normal. Speech and behavior are normal. Patient exhibits appropriate insight and  judgement.   ____________________________________________   LABS  None ____________________________________________  EKG  None ____________________________________________  RADIOLOGY  None ____________________________________________    PROCEDURES  Procedure(s) performed: None   Procedures   Medications - No data to display   ____________________________________________   INITIAL IMPRESSION / ASSESSMENT AND PLAN / ED COURSE  Pertinent labs & imaging results that were available during my care of the patient were reviewed by me and considered in my medical decision making (see chart for details).  Clinical Course    Patient's diagnosis is consistent with Encounter for recheck of abscess following incision and drainage. Patient will be discharged home with instructions to continue all medications and complete as directed. Patient is to follow up with general surgery as previously recommended considering these abscesses have been recurrent. Patient is given ED precautions to return to the ED for any worsening or new symptoms.    ____________________________________________  FINAL CLINICAL IMPRESSION(S) / ED DIAGNOSES  Final diagnoses:  Encounter for recheck of abscess following incision and drainage      NEW MEDICATIONS STARTED DURING THIS VISIT:  Discharge Medication List as of 07/21/2016  7:48 PM           [place], PA-C 07/21/16 2136    Eula Listen, MD 07/21/16 2355

## 2016-07-21 NOTE — Discharge Instructions (Signed)
Please finish all antibiotics as previously prescribed.   Follow up with general surgery as previously discussed.   Keep wound clean and dry until healed

## 2016-07-21 NOTE — ED Notes (Signed)
AAOx3.  Skin warm and dry.  NAD 

## 2016-07-21 NOTE — ED Triage Notes (Signed)
Pt was seen here Tuesday night and had packing placed, here for re-check

## 2016-12-29 ENCOUNTER — Emergency Department: Payer: Commercial Managed Care - HMO

## 2016-12-29 ENCOUNTER — Encounter: Payer: Self-pay | Admitting: Emergency Medicine

## 2016-12-29 ENCOUNTER — Emergency Department
Admission: EM | Admit: 2016-12-29 | Discharge: 2016-12-29 | Disposition: A | Discharge status: Home / Self Care | Payer: Commercial Managed Care - HMO | Attending: Emergency Medicine | Admitting: Emergency Medicine

## 2016-12-29 DIAGNOSIS — F17210 Nicotine dependence, cigarettes, uncomplicated: Secondary | ICD-10-CM | POA: Insufficient documentation

## 2016-12-29 DIAGNOSIS — L0211 Cutaneous abscess of neck: Secondary | ICD-10-CM | POA: Insufficient documentation

## 2016-12-29 DIAGNOSIS — J40 Bronchitis, not specified as acute or chronic: Secondary | ICD-10-CM | POA: Diagnosis not present

## 2016-12-29 LAB — BASIC METABOLIC PANEL
ANION GAP: 7 (ref 5–15)
BUN: 12 mg/dL (ref 6–20)
CALCIUM: 8.9 mg/dL (ref 8.9–10.3)
CO2: 26 mmol/L (ref 22–32)
Chloride: 107 mmol/L (ref 101–111)
Creatinine, Ser: 0.93 mg/dL (ref 0.61–1.24)
GLUCOSE: 112 mg/dL — AB (ref 65–99)
Potassium: 3.1 mmol/L — ABNORMAL LOW (ref 3.5–5.1)
Sodium: 140 mmol/L (ref 135–145)

## 2016-12-29 LAB — CBC
HCT: 45.3 % (ref 40.0–52.0)
HEMOGLOBIN: 15.7 g/dL (ref 13.0–18.0)
MCH: 30.6 pg (ref 26.0–34.0)
MCHC: 34.8 g/dL (ref 32.0–36.0)
MCV: 88 fL (ref 80.0–100.0)
PLATELETS: 190 10*3/uL (ref 150–440)
RBC: 5.14 MIL/uL (ref 4.40–5.90)
RDW: 13.8 % (ref 11.5–14.5)
WBC: 9.9 10*3/uL (ref 3.8–10.6)

## 2016-12-29 LAB — TROPONIN I

## 2016-12-29 MED ORDER — ALBUTEROL SULFATE HFA 108 (90 BASE) MCG/ACT IN AERS: 2.0000 | INHALATION_SPRAY | RESPIRATORY_TRACT | 0 refills | Status: DC | PRN

## 2016-12-29 MED ORDER — ALBUTEROL SULFATE (2.5 MG/3ML) 0.083% IN NEBU
5.0000 mg | INHALATION_SOLUTION | Freq: Once | RESPIRATORY_TRACT | Status: AC
  Administered 2016-12-29: 5 mg via RESPIRATORY_TRACT
  Filled 2016-12-29: qty 6

## 2016-12-29 MED ORDER — DOXYCYCLINE HYCLATE 100 MG PO TABS
100.0000 mg | ORAL_TABLET | Freq: Once | ORAL | Status: AC
  Administered 2016-12-29: 100 mg via ORAL
  Filled 2016-12-29: qty 1

## 2016-12-29 MED ORDER — LIDOCAINE HCL 2 % IJ SOLN
5.0000 mL | Freq: Once | INTRAMUSCULAR | Status: DC
  Filled 2016-12-29: qty 10

## 2016-12-29 MED ORDER — LIDOCAINE HCL (PF) 1 % IJ SOLN
5.0000 mL | Freq: Once | INTRAMUSCULAR | Status: DC
  Filled 2016-12-29: qty 5

## 2016-12-29 MED ORDER — DOXYCYCLINE HYCLATE 100 MG PO CAPS: 100.0000 mg | ORAL_CAPSULE | Freq: Two times a day (BID) | ORAL | 0 refills | Status: DC

## 2016-12-29 NOTE — Discharge Instructions (Signed)
Stay hydrated.   Take tylenol, motrin for pain.   Take doxycyline twice daily for a week.   Use albuterol as needed for cough or wheezing.   Expect drainage from the wound. Change dressing when it gets soaked.   Return in 2 days for wound check. Follow up with your doctor  Return to ER earlier if you have fever, worse neck swelling, trouble breathing, chest pain.

## 2016-12-29 NOTE — ED Triage Notes (Signed)
Patient presents to the ED with abscess to the left side of his neck x 1 week and chest tightness that began today.  Patient states, "my wife was freaking me out about what it could be so I decided to come in and get it checked out."  Patient is in no obvious distress at this time.  Denies shortness of breath.  Speaking in full sentences.

## 2016-12-29 NOTE — ED Provider Notes (Signed)
[facility] Provider Note   CSN: [id number] Arrival date & time: 12/29/16  1754     History   Chief Complaint Chief Complaint  Patient presents with  . Chest Pain  . Abscess    HPI Jose Morse is a 46 y.o. male history of previous abscesses here presenting with shortness of breath, cough, left sided neck swelling. Patient states that he smokes chronically and has been coughing more over the last week or so. He also accidentally cut himself about a week ago when he was shaving and noticed progressive swelling and redness on the left side of his neck. Denies any fevers or chills or trouble swallowing or trouble breathing. Of note, patient did have previous abscess that was drained. Patient denies history of MRSA and adamantly denies IV drug use   The history is provided by the patient.    History reviewed. No pertinent past medical history.  Patient Active Problem List   Diagnosis Date Noted  . FOLLICULITIS 74/25/9563  . CELLULITIS AND ABSCESS OF FACE 01/15/2008  . ANXIETY 11/08/2007  . HYPERLIPIDEMIA 07/15/2007  . SMOKER 07/15/2007    Past Surgical History:  Procedure Laterality Date  . CHOLECYSTECTOMY         Home Medications    Prior to Admission medications   Medication Sig Start Date End Date Taking? Authorizing Provider  HYDROcodone-acetaminophen (NORCO) 5-325 MG tablet Take 1 tablet by mouth every 6 (six) hours as needed. 07/18/16   Jenise V Bacon Menshew, PA-C  sulfamethoxazole-trimethoprim (BACTRIM DS,SEPTRA DS) 800-160 MG tablet Take 1 tablet by mouth 2 (two) times daily. 07/18/16   Dannielle Karvonen Menshew, PA-C    Family History No family history on file.  Social History Social History  Substance Use Topics  . Smoking status: Current Every Day Smoker    Packs/day: 1.00    Types: Cigarettes  . Smokeless tobacco: Never Used  . Alcohol use No     Allergies   Codeine   Review of Systems Review of Systems  Respiratory: Positive  for cough.   Cardiovascular: Positive for chest pain.  Skin: Positive for wound.  All other systems reviewed and are negative.    Physical Exam Updated Vital Signs BP (!) 156/96 (BP Location: Left Arm)   Pulse 70   Temp 98.1 F (36.7 C) (Oral)   Resp (!) 21   Ht '5\' 7"'$  (1.702 m)   Wt 145 lb (65.8 kg)   SpO2 97%   BMI 22.71 kg/m   Physical Exam  Constitutional: He is oriented to person, place, and time. He appears well-developed and well-nourished.  HENT:  Head: Normocephalic.  Mouth/Throat: Oropharynx is clear and moist.  Uvula midline, no tonsillar exudates   Eyes: EOM are normal. Pupils are equal, round, and reactive to light.  Neck:  2 cm area of fluctuance L side of neck. No active drainage. No cervical LAD. No meningeal signs   Cardiovascular: Normal rate, regular rhythm and normal heart sounds.   Pulmonary/Chest:  Mild diffuse wheezing, no crackles or accessory muscle use   Abdominal: Soft. Bowel sounds are normal. He exhibits no distension. There is no tenderness.  Musculoskeletal: Normal range of motion.  Neurological: He is alert and oriented to person, place, and time.  Skin: Skin is warm.  Psychiatric: He has a normal mood and affect.  Nursing note and vitals reviewed.    ED Treatments / Results  Labs (all labs ordered are listed, but only abnormal results are displayed) Labs Reviewed  BASIC METABOLIC PANEL - Abnormal; Notable for the following:       Result Value   Potassium 3.1 (*)    Glucose, Bld 112 (*)    All other components within normal limits  CBC  TROPONIN I    EKG  EKG Interpretation None      ED ECG REPORT I, Wandra Arthurs, the attending physician, personally viewed and interpreted this ECG.   Date: 12/29/2016  EKG Time: 18:08 pm  Rate: 74  Rhythm: normal EKG, normal sinus rhythm  Axis: normal  Intervals:none  ST&T Change: nonspecific    Radiology Dg Chest 2 View  Result Date: 12/29/2016 CLINICAL DATA:  Chest tightness for  2 days EXAM: CHEST  2 VIEW COMPARISON:  None. FINDINGS: The heart size and mediastinal contours are within normal limits. Both lungs are clear. The visualized skeletal structures are unremarkable. IMPRESSION: No active cardiopulmonary disease. Electronically Signed   By: Donavan Foil M.D.   On: 12/29/2016 18:44    Procedures Procedures (including critical care time)   EMERGENCY DEPARTMENT US SOFT TISSUE INTERPRETATION "Study: Limited Soft Tissue Ultrasound"  INDICATIONS: Soft tissue infection Multiple views of the body part were obtained in real-time with a multi-frequency linear probe  PERFORMED BY: Myself IMAGES ARCHIVED?: No SIDE:Left BODY PART:Neck INTERPRETATION:  Abcess present  INCISION AND DRAINAGE Performed by: Wandra Arthurs Consent: Verbal consent obtained. Risks and benefits: risks, benefits and alternatives were discussed Type: abscess  Body area: L neck  Anesthesia: local infiltration  Incision was made with a scalpel.  Local anesthetic: lidocaine 1 % no epinephrine  Anesthetic total: 5 ml  Complexity: complex Blunt dissection to break up loculations  Drainage: purulent  Drainage amount: moderate   Packing material: none  Patient tolerance: Patient tolerated the procedure well with no immediate complications.     Medications Ordered in ED Medications  lidocaine (PF) (XYLOCAINE) 1 % injection 5 mL (not administered)  albuterol (PROVENTIL) (2.5 MG/3ML) 0.083% nebulizer solution 5 mg (5 mg Nebulization Given 12/29/16 2105)  doxycycline (VIBRA-TABS) tablet 100 mg (100 mg Oral Given 12/29/16 2105)     Initial Impression / Assessment and Plan / ED Course  I have reviewed the triage vital signs and the nursing notes.  Pertinent labs & imaging results that were available during my care of the patient were reviewed by me and considered in my medical decision making (see chart for details).     Jose Morse is a 46 y.o. male here with L neck swelling,  cough. Bedside US confirmed small abscess. Has no floor of mouth tenderness, uvula midline, I see no signs of RPA or PTA. Has previous abscess that required drainage so I think likely MRSA. He is a smoker and has been coughing so I think likely has some bronchitis. Will check labs, CXR. Will perform I and D.  9:57 PM I and d performed with purulent discharge.  WBC nl. CXR clear. Lungs more clear after albuterol neb. Likely has bronchitis. Will dc home with doxycyline to cover MRSA and bronchitis. Will have him return in 2 days for wound check. Will also dc home with albuterol prn.    Final Clinical Impressions(s) / ED Diagnoses   Final diagnoses:  None    New Prescriptions New Prescriptions   No medications on file     Drenda Freeze, MD 12/29/16 2159

## 2017-03-27 ENCOUNTER — Emergency Department
Admission: EM | Admit: 2017-03-27 | Discharge: 2017-03-27 | Disposition: A | Discharge status: Home / Self Care | Payer: Commercial Managed Care - HMO | Attending: Emergency Medicine | Admitting: Emergency Medicine

## 2017-03-27 ENCOUNTER — Encounter: Payer: Self-pay | Admitting: Emergency Medicine

## 2017-03-27 DIAGNOSIS — F17210 Nicotine dependence, cigarettes, uncomplicated: Secondary | ICD-10-CM | POA: Insufficient documentation

## 2017-03-27 DIAGNOSIS — A770 Spotted fever due to Rickettsia rickettsii: Secondary | ICD-10-CM | POA: Diagnosis not present

## 2017-03-27 DIAGNOSIS — R21 Rash and other nonspecific skin eruption: Secondary | ICD-10-CM | POA: Diagnosis present

## 2017-03-27 MED ORDER — KETOROLAC TROMETHAMINE 30 MG/ML IJ SOLN
INTRAMUSCULAR | Status: AC
  Filled 2017-03-27: qty 1

## 2017-03-27 MED ORDER — KETOROLAC TROMETHAMINE 30 MG/ML IJ SOLN
30.0000 mg | Freq: Once | INTRAMUSCULAR | Status: AC
  Administered 2017-03-27: 30 mg via INTRAMUSCULAR

## 2017-03-27 MED ORDER — DOXYCYCLINE HYCLATE 100 MG PO CAPS: 100.0000 mg | ORAL_CAPSULE | Freq: Two times a day (BID) | ORAL | 0 refills | Status: AC

## 2017-03-27 NOTE — ED Triage Notes (Signed)
Pulled off of a tick last week.  For past two days c/o headache and rash to torso.

## 2017-03-27 NOTE — ED Notes (Signed)
FN: pt with possible tick bite and now has swelling to site, neck pain and headache. Also reports rash.

## 2017-03-27 NOTE — ED Notes (Signed)
See triage note  States he found a tick on him about 1 1/2 weeks ago  Developed headache ,feeling fatigued and low grade fever yesterday  Area to left lateral chest red and slightly swollen

## 2017-03-27 NOTE — ED Provider Notes (Signed)
[facility] Emergency Department Provider Note  ____________________________________________  Time seen: Approximately 5:14 PM  I have reviewed the triage vital signs and the nursing notes.   HISTORY  Chief Complaint Rash    HPI Jose Morse is a 46 y.o. male presenting to the emergency department with low-grade fever, headache, rash, myalgias and one episode of vomiting since being bitten by a tick one week ago. Patient denies rhinorrhea, congestion and nonproductive cough. Patient's fever has been as high as 100.42F assessed orally. Patient is unsure how long tick was in place before being removed. He denies associated chest pain, chest tightness, abdominal pain and diarrhea. No alleviating measures a been attempted.   History reviewed. No pertinent past medical history.  Patient Active Problem List   Diagnosis Date Noted  . FOLLICULITIS 38/18/2993  . CELLULITIS AND ABSCESS OF FACE 01/15/2008  . ANXIETY 11/08/2007  . HYPERLIPIDEMIA 07/15/2007  . SMOKER 07/15/2007    Past Surgical History:  Procedure Laterality Date  . CHOLECYSTECTOMY      Prior to Admission medications   Medication Sig Start Date End Date Taking? Authorizing Provider  albuterol (PROVENTIL HFA;VENTOLIN HFA) 108 (90 Base) MCG/ACT inhaler Inhale 2 puffs into the lungs every 4 (four) hours as needed for wheezing or shortness of breath (cough). 12/29/16   Drenda Freeze, MD  doxycycline (VIBRAMYCIN) 100 MG capsule Take 1 capsule (100 mg total) by mouth 2 (two) times daily. 03/27/17 04/06/17  Lannie Fields, PA-C  HYDROcodone-acetaminophen (NORCO) 5-325 MG tablet Take 1 tablet by mouth every 6 (six) hours as needed. 07/18/16   Menshew, Dannielle Karvonen, PA-C  sulfamethoxazole-trimethoprim (BACTRIM DS,SEPTRA DS) 800-160 MG tablet Take 1 tablet by mouth 2 (two) times daily. 07/18/16   Menshew, Dannielle Karvonen, PA-C    Allergies Codeine  No family history on file.  Social History Social  History  Substance Use Topics  . Smoking status: Current Every Day Smoker    Packs/day: 1.00    Types: Cigarettes  . Smokeless tobacco: Never Used  . Alcohol use No     Review of Systems  Constitutional: patient has had fever Eyes: No visual changes. No discharge ENT: No upper respiratory complaints. Cardiovascular: no chest pain. Respiratory: no cough. No SOB. Gastrointestinal: No abdominal pain. Patient has had one episode of emesis. No diarrhea.  No constipation. Genitourinary: Negative for dysuria. No hematuria Musculoskeletal: patient has had myalgias Skin: patient has rash Neurological: patient has had headache, no focal weakness or numbness.   ____________________________________________   PHYSICAL EXAM:  VITAL SIGNS: ED Triage Vitals  Enc Vitals Group     BP 03/27/17 1552 (!) 148/96     Pulse Rate 03/27/17 1552 63     Resp 03/27/17 1552 16     Temp 03/27/17 1552 98.9 F (37.2 C)     Temp Source 03/27/17 1552 Oral     SpO2 03/27/17 1552 98 %     Weight 03/27/17 1551 145 lb (65.8 kg)     Height 03/27/17 1551 5\' 7"  (1.702 m)     Head Circumference --      Peak Flow --      Pain Score 03/27/17 1551 5     Pain Loc --      Pain Edu? --      Excl. in [place]? --      Constitutional: Alert and oriented. Well appearing and in no acute distress. Eyes: Conjunctivae are normal. PERRL. EOMI. Head: Atraumatic. ENT:  Ears: tympanic membranes are pearly bilaterally.      Nose: No congestion/rhinnorhea.      Mouth/Throat: Mucous membranes are moist.  Neck: full range of motion Hematological/Lymphatic/Immunilogical: No cervical lymphadenopathy. Cardiovascular: Normal rate, regular rhythm. Normal S1 and S2.  Good peripheral circulation. Respiratory: Normal respiratory effort without tachypnea or retractions. Lungs CTAB. Good air entry to the bases with no decreased or absent breath sounds. Gastrointestinal: Bowel sounds 4 quadrants. Soft and nontender to palpation. No  guarding or rigidity. No palpable masses. No distention. No CVA tenderness. Musculoskeletal: Full range of motion to all extremities. No gross deformities appreciated. Neurologic:  Normal speech and language. No gross focal neurologic deficits are appreciated.  Skin: patient has a 3 cm x 3 cm erythematous, macular rash at the tick site of the skin overlying the torso Psychiatric: Mood and affect are normal. Speech and behavior are normal. Patient exhibits appropriate insight and judgement.   ____________________________________________   LABS (all labs ordered are listed, but only abnormal results are displayed)  Labs Reviewed - No data to display ____________________________________________  EKG   ____________________________________________  RADIOLOGY  No results found.  ____________________________________________    PROCEDURES  Procedure(s) performed:    Procedures    Medications - No data to display   ____________________________________________   INITIAL IMPRESSION / ASSESSMENT AND PLAN / ED COURSE  Pertinent labs & imaging results that were available during my care of the patient were reviewed by me and considered in my medical decision making (see chart for details).  Review of the [place] CSRS was performed in accordance of the [place] prior to dispensing any controlled drugs.     Assessment and plan: [facility] spotted fever: Patient presents to the emergency department with headache, low-grade fever, rash and myalgias for approximately 1 week after removing a tick. Symptoms are consistent with [facility] spotted fever. Patient was discharged with doxycycline. Patient was given an injection of Toradol in the emergency department for headache. Patient was advised to follow-up with his primary care provider as needed. Vital signs were reassuring at discharge. All patient questions were  answered.     ____________________________________________  FINAL CLINICAL IMPRESSION(S) / ED DIAGNOSES  Final diagnoses:  [place] spotted fever      NEW MEDICATIONS STARTED DURING THIS VISIT:  New Prescriptions   DOXYCYCLINE (VIBRAMYCIN) 100 MG CAPSULE    Take 1 capsule (100 mg total) by mouth 2 (two) times daily.        This chart was dictated using voice recognition software/Dragon. Despite best efforts to proofread, errors can occur which can change the meaning. Any change was purely unintentional.    Lannie Fields, PA-C 03/27/17 1723    Orbie Pyo, MD 03/27/17 [phone]

## 2021-12-28 ENCOUNTER — Emergency Department: Payer: BC Managed Care – PPO

## 2021-12-28 ENCOUNTER — Other Ambulatory Visit: Payer: Self-pay

## 2021-12-28 ENCOUNTER — Emergency Department
Admission: EM | Admit: 2021-12-28 | Discharge: 2021-12-28 | Disposition: A | Discharge status: Home / Self Care | Payer: BC Managed Care – PPO | Attending: Emergency Medicine | Admitting: Emergency Medicine

## 2021-12-28 ENCOUNTER — Encounter: Payer: Self-pay | Admitting: Emergency Medicine

## 2021-12-28 DIAGNOSIS — J181 Lobar pneumonia, unspecified organism: Secondary | ICD-10-CM | POA: Insufficient documentation

## 2021-12-28 DIAGNOSIS — R079 Chest pain, unspecified: Secondary | ICD-10-CM | POA: Diagnosis present

## 2021-12-28 DIAGNOSIS — J189 Pneumonia, unspecified organism: Secondary | ICD-10-CM

## 2021-12-28 DIAGNOSIS — Z87891 Personal history of nicotine dependence: Secondary | ICD-10-CM | POA: Diagnosis not present

## 2021-12-28 LAB — CBC
HCT: 47.8 % (ref 39.0–52.0)
Hemoglobin: 15.8 g/dL (ref 13.0–17.0)
MCH: 29.4 pg (ref 26.0–34.0)
MCHC: 33.1 g/dL (ref 30.0–36.0)
MCV: 88.8 fL (ref 80.0–100.0)
Platelets: 254 10*3/uL (ref 150–400)
RBC: 5.38 MIL/uL (ref 4.22–5.81)
RDW: 13.1 % (ref 11.5–15.5)
WBC: 12.3 10*3/uL — ABNORMAL HIGH (ref 4.0–10.5)
nRBC: 0 % (ref 0.0–0.2)

## 2021-12-28 LAB — BASIC METABOLIC PANEL
Anion gap: 8 (ref 5–15)
BUN: 7 mg/dL (ref 6–20)
CO2: 26 mmol/L (ref 22–32)
Calcium: 9.4 mg/dL (ref 8.9–10.3)
Chloride: 107 mmol/L (ref 98–111)
Creatinine, Ser: 0.88 mg/dL (ref 0.61–1.24)
GFR, Estimated: >60 mL/min (ref >60)
Glucose, Bld: 98 mg/dL (ref 70–99)
Potassium: 3.8 mmol/L (ref 3.5–5.1)
Sodium: 141 mmol/L (ref 135–145)

## 2021-12-28 LAB — TROPONIN I (HIGH SENSITIVITY)
Troponin I (High Sensitivity): 4 ng/L (ref <18)
Troponin I (High Sensitivity): 4 ng/L (ref <18)

## 2021-12-28 MED ORDER — IPRATROPIUM-ALBUTEROL 0.5-2.5 (3) MG/3ML IN SOLN
RESPIRATORY_TRACT | Status: AC
  Filled 2021-12-28: qty 6

## 2021-12-28 MED ORDER — IPRATROPIUM-ALBUTEROL 0.5-2.5 (3) MG/3ML IN SOLN
9.0000 mL | Freq: Once | RESPIRATORY_TRACT | Status: AC
Start: 2021-12-28 — End: 2021-12-28
  Administered 2021-12-28: 9 mL via RESPIRATORY_TRACT
  Filled 2021-12-28: qty 3

## 2021-12-28 MED ORDER — METHYLPREDNISOLONE SODIUM SUCC 125 MG IJ SOLR
125.0000 mg | INTRAMUSCULAR | Status: AC
  Administered 2021-12-28: 125 mg via INTRAVENOUS
  Filled 2021-12-28: qty 2

## 2021-12-28 MED ORDER — ALBUTEROL SULFATE HFA 108 (90 BASE) MCG/ACT IN AERS: 2.0000 | INHALATION_SPRAY | RESPIRATORY_TRACT | 0 refills | Status: AC | PRN

## 2021-12-28 MED ORDER — PREDNISONE 20 MG PO TABS: 40.0000 mg | ORAL_TABLET | Freq: Every day | ORAL | 0 refills | Status: AC

## 2021-12-28 MED ORDER — AZITHROMYCIN 250 MG PO TABS: ORAL_TABLET | ORAL | 0 refills | Status: DC

## 2021-12-28 NOTE — ED Provider Notes (Signed)
? ?[facility] ?Provider Note ? ? ? Event Date/Time  ? First MD Initiated Contact with Patient 12/28/21 [phone]   ?  (approximate) ? ? ?History  ? ?Chest Pain ? ? ?HPI ? ?Jose Morse is a 51 y.o. male with a history of hyperlipidemia, anxiety, smoking who comes ED complaining of left-sided chest pain that started at 3:30 AM.  Constant since then.  No aggravating or alleviating factors, associated with shortness of breath.  No cough.  No vomiting or diaphoresis.  Not exertional, not pleuritic.  Contrary to the triage note, he reports that it is not radiating. ? ?Patient denies any recent exertional symptoms. ?  ? ? ?Physical Exam  ? ?Triage Vital Signs: ?ED Triage Vitals  ?Enc Vitals Group  ?   BP 12/28/21 0849 (!) 139/92  ?   Pulse Rate 12/28/21 0849 71  ?   Resp 12/28/21 0849 17  ?   Temp 12/28/21 0849 97.9 ?F (36.6 ?C)  ?   Temp Source 12/28/21 0849 Oral  ?   SpO2 12/28/21 0849 97 %  ?   Weight 12/28/21 0850 145 lb 1 oz (65.8 kg)  ?   Height 12/28/21 0850 5\' 7"  (1.702 m)  ?   Head Circumference --   ?   Peak Flow --   ?   Pain Score 12/28/21 0850 7  ?   Pain Loc --   ?   Pain Edu? --   ?   Excl. in [place]? --   ? ? ?Most recent vital signs: ?Vitals:  ? 12/28/21 0849 12/28/21 1031  ?BP: (!) 139/92 131/77  ?Pulse: 71 85  ?Resp: 17 20  ?Temp: 97.9 ?F (36.6 ?C) 98.5 ?F (36.9 ?C)  ?SpO2: 97% 97%  ? ? ? ?General: Awake, no distress.  ?CV:  Good peripheral perfusion.  Regular rate and rhythm, normal peripheral pulses. ?Resp:  Normal effort.  Good air entry bilaterally.  There is diffuse expiratory wheezing, prolonged expiratory phase with FEV1 maneuver. ?Abd:  No distention.  Soft and nontender ?Other:  No lower extremity edema, no calf tenderness or swelling. ? ? ?ED Results / Procedures / Treatments  ? ?Labs ?(all labs ordered are listed, but only abnormal results are displayed) ?Labs Reviewed  ?CBC - Abnormal; Notable for the following components:  ?    Result Value  ? WBC 12.3 (*)   ? All other  components within normal limits  ?BASIC METABOLIC PANEL  ?TROPONIN I (HIGH SENSITIVITY)  ?TROPONIN I (HIGH SENSITIVITY)  ? ? ? ?EKG ? ?Interpreted by me ?Normal sinus rhythm rate of 69.  Normal axis intervals QRS ST segments and T waves.  No ischemic changes. ? ? ?RADIOLOGY ?Chest x-ray viewed and interpreted by me, shows left upper lobe infiltrate.  No edema or effusion.  No pneumothorax.  Radiology report reviewed ? ? ? ?PROCEDURES: ? ?Critical Care performed: No ? ?Procedures ? ? ?MEDICATIONS ORDERED IN ED: ?Medications  ?methylPREDNISolone sodium succinate (SOLU-MEDROL) 125 mg/2 mL injection 125 mg (125 mg Intravenous Given 12/28/21 0918)  ?ipratropium-albuterol (DUONEB) 0.5-2.5 (3) MG/3ML nebulizer solution 9 mL (9 mLs Nebulization Given 12/28/21 0918)  ? ? ? ?IMPRESSION / MDM / ASSESSMENT AND PLAN / ED COURSE  ?I reviewed the triage vital signs and the nursing notes. ?             ?               ? ?Differential diagnosis includes, but is not  limited to, asthma/COPD exacerbation, GERD, non-STEMI, dehydration ? ?Patient presents with nonspecific chest pain, most likely bronchospasm related with his wheezing on exam, nonexertional/atypical noncardiac nature of the chest pain. ? ?We will obtain labs, chest x-ray, give Solu-Medrol and DuoNebs. ? ? ?----------------------------------------- ?11:25 AM on 12/28/2021 ?----------------------------------------- ?Patient feeling better after nebs.  Wheezing has resolved.  Chest x-ray shows left upper lobe infiltrate consistent with a community-acquired pneumonia.  Not requiring admission due to normal vital signs, not septic. ? ?  ? ? ?FINAL CLINICAL IMPRESSION(S) / ED DIAGNOSES  ? ?Final diagnoses:  ?Community acquired pneumonia of left upper lobe of lung  ? ? ? ?Rx / DC Orders  ? ?ED Discharge Orders   ? ?      Ordered  ?  azithromycin (ZITHROMAX Z-PAK) 250 MG tablet       ? 12/28/21 1123  ?  albuterol (PROVENTIL HFA) 108 (90 Base) MCG/ACT inhaler  Every 4 hours PRN        ? 12/28/21 1123  ?  predniSONE (DELTASONE) 20 MG tablet  Daily with breakfast       ? 12/28/21 1123  ? ?  ?  ? ?  ? ? ? ?Note:  This document was prepared using Dragon voice recognition software and may include unintentional dictation errors. ?  ?Carrie Mew, MD ?12/28/21 1129 ? ?

## 2021-12-28 NOTE — ED Triage Notes (Signed)
Pt comes into the ED via POV c/o left side chest pin that radiates into the left arm and is causing SHOB.  PT denies any cardiac history.  PT explains the pain started this morning.  Pt in NAD with even and unlabored respirations.  ?

## 2021-12-28 NOTE — ED Notes (Signed)
EDP at [facility]. Pt alert, NAD, calm, interactive, resps e/u. Pt to xray. ?

## 2021-12-28 NOTE — ED Notes (Signed)
Pt alert, NAD, calm, interactive, "feel better, still hurting".  ?

## 2022-01-02 ENCOUNTER — Encounter: Payer: Self-pay | Admitting: Emergency Medicine

## 2022-01-02 ENCOUNTER — Other Ambulatory Visit: Payer: Self-pay

## 2022-01-02 ENCOUNTER — Emergency Department: Payer: BC Managed Care – PPO

## 2022-01-02 DIAGNOSIS — Z9049 Acquired absence of other specified parts of digestive tract: Secondary | ICD-10-CM

## 2022-01-02 DIAGNOSIS — Z79899 Other long term (current) drug therapy: Secondary | ICD-10-CM

## 2022-01-02 DIAGNOSIS — J189 Pneumonia, unspecified organism: Secondary | ICD-10-CM | POA: Diagnosis not present

## 2022-01-02 DIAGNOSIS — Z885 Allergy status to narcotic agent status: Secondary | ICD-10-CM

## 2022-01-02 DIAGNOSIS — E876 Hypokalemia: Secondary | ICD-10-CM | POA: Diagnosis present

## 2022-01-02 DIAGNOSIS — E785 Hyperlipidemia, unspecified: Secondary | ICD-10-CM | POA: Diagnosis present

## 2022-01-02 DIAGNOSIS — R918 Other nonspecific abnormal finding of lung field: Secondary | ICD-10-CM | POA: Diagnosis not present

## 2022-01-02 DIAGNOSIS — F17210 Nicotine dependence, cigarettes, uncomplicated: Secondary | ICD-10-CM | POA: Diagnosis present

## 2022-01-02 DIAGNOSIS — F419 Anxiety disorder, unspecified: Secondary | ICD-10-CM | POA: Diagnosis present

## 2022-01-02 DIAGNOSIS — Z20822 Contact with and (suspected) exposure to covid-19: Secondary | ICD-10-CM | POA: Diagnosis present

## 2022-01-02 LAB — CBC
HCT: 45.6 % (ref 39.0–52.0)
Hemoglobin: 15.2 g/dL (ref 13.0–17.0)
MCH: 29.8 pg (ref 26.0–34.0)
MCHC: 33.3 g/dL (ref 30.0–36.0)
MCV: 89.4 fL (ref 80.0–100.0)
Platelets: 249 10*3/uL (ref 150–400)
RBC: 5.1 MIL/uL (ref 4.22–5.81)
RDW: 12.9 % (ref 11.5–15.5)
WBC: 11.8 10*3/uL — ABNORMAL HIGH (ref 4.0–10.5)
nRBC: 0 % (ref 0.0–0.2)

## 2022-01-02 NOTE — ED Triage Notes (Signed)
Pt to ED via POV with c/o L sided CP, states was seen last Wednesday and dx with pneumonia. P states pain has pogressively worsened since then. Pt states pain also in his back. Pt A&O x4, ambulatory to triage with steady gait. Pt states pain worse with inspiration.  ?

## 2022-01-03 ENCOUNTER — Encounter: Payer: Self-pay | Admitting: Family Medicine

## 2022-01-03 ENCOUNTER — Inpatient Hospital Stay
Admission: EM | Admit: 2022-01-03 | Discharge: 2022-01-04 | DRG: 195 | Disposition: A | Discharge status: Home / Self Care | Payer: BC Managed Care – PPO | Attending: Internal Medicine | Admitting: Internal Medicine

## 2022-01-03 ENCOUNTER — Emergency Department: Payer: BC Managed Care – PPO

## 2022-01-03 DIAGNOSIS — R918 Other nonspecific abnormal finding of lung field: Secondary | ICD-10-CM | POA: Diagnosis present

## 2022-01-03 DIAGNOSIS — J189 Pneumonia, unspecified organism: Secondary | ICD-10-CM | POA: Diagnosis present

## 2022-01-03 DIAGNOSIS — E785 Hyperlipidemia, unspecified: Secondary | ICD-10-CM | POA: Diagnosis present

## 2022-01-03 DIAGNOSIS — Z720 Tobacco use: Secondary | ICD-10-CM

## 2022-01-03 DIAGNOSIS — F419 Anxiety disorder, unspecified: Secondary | ICD-10-CM | POA: Diagnosis present

## 2022-01-03 DIAGNOSIS — Z20822 Contact with and (suspected) exposure to covid-19: Secondary | ICD-10-CM | POA: Diagnosis present

## 2022-01-03 DIAGNOSIS — F17210 Nicotine dependence, cigarettes, uncomplicated: Secondary | ICD-10-CM | POA: Diagnosis present

## 2022-01-03 DIAGNOSIS — R0781 Pleurodynia: Secondary | ICD-10-CM | POA: Diagnosis not present

## 2022-01-03 DIAGNOSIS — E876 Hypokalemia: Secondary | ICD-10-CM

## 2022-01-03 DIAGNOSIS — Z79899 Other long term (current) drug therapy: Secondary | ICD-10-CM | POA: Diagnosis not present

## 2022-01-03 DIAGNOSIS — Z9049 Acquired absence of other specified parts of digestive tract: Secondary | ICD-10-CM | POA: Diagnosis not present

## 2022-01-03 DIAGNOSIS — Z885 Allergy status to narcotic agent status: Secondary | ICD-10-CM | POA: Diagnosis not present

## 2022-01-03 LAB — STREP PNEUMONIAE URINARY ANTIGEN: Strep Pneumo Urinary Antigen: NEGATIVE

## 2022-01-03 LAB — BASIC METABOLIC PANEL
Anion gap: 10 (ref 5–15)
BUN: 19 mg/dL (ref 6–20)
CO2: 26 mmol/L (ref 22–32)
Calcium: 9 mg/dL (ref 8.9–10.3)
Chloride: 103 mmol/L (ref 98–111)
Creatinine, Ser: 1.03 mg/dL (ref 0.61–1.24)
GFR, Estimated: >60 mL/min (ref >60)
Glucose, Bld: 111 mg/dL — ABNORMAL HIGH (ref 70–99)
Potassium: 3.4 mmol/L — ABNORMAL LOW (ref 3.5–5.1)
Sodium: 139 mmol/L (ref 135–145)

## 2022-01-03 LAB — RESPIRATORY PANEL BY PCR

## 2022-01-03 LAB — EXPECTORATED SPUTUM ASSESSMENT W GRAM STAIN, RFLX TO RESP C

## 2022-01-03 LAB — MRSA NEXT GEN BY PCR, NASAL: MRSA by PCR Next Gen: NOT DETECTED

## 2022-01-03 LAB — RESP PANEL BY RT-PCR (FLU A&B, COVID) ARPGX2
Influenza A by PCR: NEGATIVE
Influenza B by PCR: NEGATIVE
SARS Coronavirus 2 by RT PCR: NEGATIVE

## 2022-01-03 LAB — TROPONIN I (HIGH SENSITIVITY)
Troponin I (High Sensitivity): 4 ng/L (ref <18)
Troponin I (High Sensitivity): 5 ng/L (ref <18)

## 2022-01-03 LAB — HIV ANTIBODY (ROUTINE TESTING W REFLEX): HIV Screen 4th Generation wRfx: NONREACTIVE

## 2022-01-03 LAB — PROCALCITONIN
Procalcitonin: <0.1 ng/mL
Procalcitonin: <0.1 ng/mL

## 2022-01-03 MED ORDER — ALBUTEROL SULFATE (2.5 MG/3ML) 0.083% IN NEBU: 2.5000 mg | INHALATION_SOLUTION | RESPIRATORY_TRACT | Status: DC | PRN

## 2022-01-03 MED ORDER — ACETAMINOPHEN 325 MG PO TABS: 650.0000 mg | ORAL_TABLET | Freq: Four times a day (QID) | ORAL | Status: DC | PRN

## 2022-01-03 MED ORDER — SODIUM CHLORIDE 0.9 % IV SOLN
2.0000 g | INTRAVENOUS | Status: DC
  Administered 2022-01-04: 2 g via INTRAVENOUS
  Filled 2022-01-03: qty 2

## 2022-01-03 MED ORDER — SENNOSIDES-DOCUSATE SODIUM 8.6-50 MG PO TABS: 1.0000 | ORAL_TABLET | Freq: Every evening | ORAL | Status: DC | PRN

## 2022-01-03 MED ORDER — ONDANSETRON HCL 4 MG PO TABS: 4.0000 mg | ORAL_TABLET | Freq: Four times a day (QID) | ORAL | Status: DC | PRN

## 2022-01-03 MED ORDER — SODIUM CHLORIDE 0.9 % IV SOLN
500.0000 mg | Freq: Once | INTRAVENOUS | Status: AC
  Administered 2022-01-03: 500 mg via INTRAVENOUS
  Filled 2022-01-03: qty 5

## 2022-01-03 MED ORDER — DEXAMETHASONE SODIUM PHOSPHATE 4 MG/ML IJ SOLN
4.0000 mg | Freq: Two times a day (BID) | INTRAMUSCULAR | Status: DC
  Administered 2022-01-03 – 2022-01-04 (×2): 4 mg via INTRAVENOUS
  Filled 2022-01-03 (×2): qty 1

## 2022-01-03 MED ORDER — POTASSIUM CHLORIDE CRYS ER 20 MEQ PO TBCR
20.0000 meq | EXTENDED_RELEASE_TABLET | Freq: Once | ORAL | Status: AC
  Administered 2022-01-03: 20 meq via ORAL
  Filled 2022-01-03: qty 1

## 2022-01-03 MED ORDER — IOHEXOL 350 MG/ML SOLN
75.0000 mL | Freq: Once | INTRAVENOUS | Status: AC | PRN
  Administered 2022-01-03: 75 mL via INTRAVENOUS

## 2022-01-03 MED ORDER — AZITHROMYCIN 500 MG PO TABS
500.0000 mg | ORAL_TABLET | Freq: Every day | ORAL | Status: DC
  Administered 2022-01-04: 500 mg via ORAL
  Filled 2022-01-03: qty 1

## 2022-01-03 MED ORDER — ONDANSETRON HCL 4 MG/2ML IJ SOLN: 4.0000 mg | Freq: Four times a day (QID) | INTRAMUSCULAR | Status: DC | PRN

## 2022-01-03 MED ORDER — KETOROLAC TROMETHAMINE 30 MG/ML IJ SOLN
30.0000 mg | Freq: Once | INTRAMUSCULAR | Status: AC
  Administered 2022-01-03: 30 mg via INTRAVENOUS
  Filled 2022-01-03: qty 1

## 2022-01-03 MED ORDER — OXYCODONE HCL 5 MG PO TABS: 5.0000 mg | ORAL_TABLET | ORAL | Status: DC | PRN

## 2022-01-03 MED ORDER — FENTANYL CITRATE PF 50 MCG/ML IJ SOSY: 50.0000 ug | PREFILLED_SYRINGE | INTRAMUSCULAR | Status: DC | PRN

## 2022-01-03 MED ORDER — ENOXAPARIN SODIUM 40 MG/0.4ML IJ SOSY
40.0000 mg | PREFILLED_SYRINGE | INTRAMUSCULAR | Status: DC
  Administered 2022-01-03: 40 mg via SUBCUTANEOUS
  Filled 2022-01-03: qty 0.4

## 2022-01-03 MED ORDER — SODIUM CHLORIDE 0.9 % IV SOLN
2.0000 g | Freq: Once | INTRAVENOUS | Status: AC
  Administered 2022-01-03: 2 g via INTRAVENOUS
  Filled 2022-01-03: qty 20

## 2022-01-03 MED ORDER — SODIUM CHLORIDE 0.9 % IV BOLUS (SEPSIS)
1000.0000 mL | Freq: Once | INTRAVENOUS | Status: AC
Start: 2022-01-03 — End: 2022-01-03
  Administered 2022-01-03: 1000 mL via INTRAVENOUS

## 2022-01-03 MED ORDER — ACETAMINOPHEN 650 MG RE SUPP
650.0000 mg | Freq: Four times a day (QID) | RECTAL | Status: DC | PRN
Start: 2022-01-03 — End: 2022-01-04

## 2022-01-03 NOTE — Progress Notes (Signed)
Admission profile updated. ?

## 2022-01-03 NOTE — Assessment & Plan Note (Addendum)
Left-sided pleuritic type chest pain. ?CTA chest 3/14: No PE but confirmed left hilar/mediastinal mass/malignancy and adenopathy. ?Given history of prolonged and heavy smoking, highly suspicious for malignancy. ?Pulmonology consulted.  Discussed with Dr. Lanney Gins who recommends first treating aggressively with antimicrobials for pneumonia followed by PET scan and bronchoscopy as outpatient.   ?

## 2022-01-03 NOTE — Hospital Course (Signed)
51 year old male with medical history significant for heavy tobacco use disorder, presented to the emergency room for evaluation of left-sided chest pain and cough.  Pleuritic type chest pain started on 3/6, seen in ED on 3/8, diagnosed with pneumonia and discharged on azithromycin, prednisone and albuterol inhaler but symptoms persisted.  CTA chest showed left hilar/mediastinal mass and adenopathy concerning for malignancy and left upper lobe postobstructive pneumonia.  Started on empiric IV antibiotics.  Pulmonology consulted for evaluation and management. ?

## 2022-01-03 NOTE — Assessment & Plan Note (Signed)
Continue empirically started IV ceftriaxone and azithromycin. ?Will require longer course than usual CAP due to obstruction. ?

## 2022-01-03 NOTE — Progress Notes (Addendum)
?PROGRESS NOTE ?  ?Jose Morse  DGU:[id number]    DOB: 1971-06-21    DOA: 01/03/2022 ? ?PCP: Pcp, No  ? ?I have briefly reviewed patients previous medical records in [facility]. ? ?Chief Complaint  ?Patient presents with  ? Chest Pain  ? ? ?Hospital Course:  ?51 year old male with medical history significant for heavy tobacco use disorder, presented to the emergency room for evaluation of left-sided chest pain and cough.  Pleuritic type chest pain started on 3/6, seen in ED on 3/8, diagnosed with pneumonia and discharged on azithromycin, prednisone and albuterol inhaler but symptoms persisted.  CTA chest showed left hilar/mediastinal mass and adenopathy concerning for malignancy and left upper lobe postobstructive pneumonia.  Started on empiric IV antibiotics.  Pulmonology consulted for evaluation and management. ? ? ?Assessment & Plan:  ?Assessment and Plan: ?* Left hilar/mediastinal mass/malignancy and adenopathy ?Left-sided pleuritic type chest pain. ?CTA chest 3/14: No PE but confirmed left hilar/mediastinal mass/malignancy and adenopathy. ?Given history of prolonged and heavy smoking, highly suspicious for malignancy. ?Pulmonology consulted.  Discussed with Dr. Lanney Gins who recommends first treating aggressively with antimicrobials for pneumonia followed by PET scan and bronchoscopy as outpatient.   ? ?Postobstructive pneumonia ?Continue empirically started IV ceftriaxone and azithromycin. ?Will require longer course than usual CAP due to obstruction. ? ?Pleuritic chest pain ?May be secondary to lung mass versus pneumonia. ?Symptomatic treatment. ?Seems to have improved. ? ?Tobacco abuse ?Absolute cessation counseled. ?Offered nicotine patch but patient declined. ? ?Hypokalemia ?Replace and follow-up ? ?Body mass index is 22.72 kg/m?. ? ?DVT prophylaxis: enoxaparin (LOVENOX) injection 40 mg Start: 01/03/22 2200 ?SCDs Start: 01/03/22 0258.  Added Lovenox. ?  Code Status: Full Code:  ?Family  Communication:  ?Disposition:  ?Status is: Observation ?The patient will require care spanning > 2 midnights and should be moved to inpatient because: Lung mass that needs further evaluation, likely a bronchoscopy and biopsy, IV antibiotics for postobstructive pneumonia. ?  ? ?Consultants:   ?Dr. Lanney Gins, pulmonology ? ?Procedures:   ?None ? ?Antimicrobials:   ?IV ceftriaxone and azithromycin 3/13 > ? ? ?Subjective:  ?Feels better.  Chest pain has almost resolved.  Denies dyspnea.  Intermittent dry cough, no hemoptysis.  Declines nicotine patch. ? ?Objective:  ? ?Vitals:  ? 01/03/22 0730 01/03/22 0800 01/03/22 0826 01/03/22 1646  ?BP: 121/82 (!) 130/98 (!) 154/94 129/80  ?Pulse: 60 60 (!) 55 (!) 59  ?Resp: 16  16 18   ?Temp: 97.8 ?F (36.6 ?C)  97.8 ?F (36.6 ?C) 98.9 ?F (37.2 ?C)  ?TempSrc: Oral  Oral   ?SpO2: 96% 97% 100% 95%  ?Weight:      ?Height:      ? ? ?General exam: Young male, well-built and nourished lying comfortably propped up in bed without distress. ?Respiratory system: Diminished breath sounds in the left upper zone, scattered occasional rhonchi bilateral upper anterior chest, left greater than sign right.  No crackles.  Rest of lung fields clear to auscultation.  No increased work of breathing. ?Cardiovascular system: S1 & S2 heard, RRR. No JVD, murmurs, rubs, gallops or clicks. No pedal edema. ?Gastrointestinal system: Abdomen is nondistended, soft and nontender. No organomegaly or masses felt. Normal bowel sounds heard. ?Central nervous system: Alert and oriented. No focal neurological deficits. ?Extremities: Symmetric 5 x 5 power. ?Skin: No rashes, lesions or ulcers ?Psychiatry: Judgement and insight appear normal. Mood & affect appropriate.  ? ? ? ?Data Reviewed:   ?I have personally reviewed following labs and imaging studies ? ? ?  CBC: ?Recent Labs  ?Lab 12/28/21 ?0900 01/02/22 ?2327  ?WBC 12.3* 11.8*  ?HGB 15.8 15.2  ?HCT 47.8 45.6  ?MCV 88.8 89.4  ?PLT 254 249  ? ? ?Basic Metabolic  Panel: ?Recent Labs  ?Lab 12/28/21 ?0900 01/02/22 ?2327  ?NA 141 139  ?K 3.8 3.4*  ?CL 107 103  ?CO2 26 26  ?GLUCOSE 98 111*  ?BUN 7 19  ?CREATININE 0.88 1.03  ?CALCIUM 9.4 9.0  ? ? ?Liver Function Tests: ?No results for input(s): AST, ALT, ALKPHOS, BILITOT, PROT, ALBUMIN in the last 168 hours. ? ?CBG: ?No results for input(s): GLUCAP in the last 168 hours. ? ?Microbiology Studies:  ? ?Recent Results (from the past 240 hour(s))  ?Resp Panel by RT-PCR (Flu A&B, Covid) Nasopharyngeal Swab     Status: None  ? Collection Time: 01/03/22  2:25 AM  ? Specimen: Nasopharyngeal Swab; Nasopharyngeal(NP) swabs in vial transport medium  ?Result Value Ref Range Status  ? SARS Coronavirus 2 by RT PCR NEGATIVE NEGATIVE Final  ?  Comment: (NOTE) ?SARS-CoV-2 target nucleic acids are NOT DETECTED. ? ?The SARS-CoV-2 RNA is generally detectable in upper respiratory ?specimens during the acute phase of infection. The lowest ?concentration of SARS-CoV-2 viral copies this assay can detect is ?138 copies/mL. A negative result does not preclude SARS-Cov-2 ?infection and should not be used as the sole basis for treatment or ?other patient management decisions. A negative result may occur with  ?improper specimen collection/handling, submission of specimen other ?than nasopharyngeal swab, presence of viral mutation(s) within the ?areas targeted by this assay, and inadequate number of viral ?copies(<138 copies/mL). A negative result must be combined with ?clinical observations, patient history, and epidemiological ?information. The expected result is Negative. ? ?Fact Sheet for Patients:  ?EntrepreneurPulse.com.au ? ?Fact Sheet for Healthcare Providers:  ?IncredibleEmployment.be ? ?This test is no t yet approved or cleared by the [place] FDA and  ?has been authorized for detection and/or diagnosis of SARS-CoV-2 by ?FDA under an Emergency Use Authorization (EUA). This EUA will remain  ?in effect (meaning  this test can be used) for the duration of the ?COVID-19 declaration under Section 564(b)(1) of the Act, 21 ?U.S.C.section 360bbb-3(b)(1), unless the authorization is terminated  ?or revoked sooner.  ? ? ?  ? Influenza A by PCR NEGATIVE NEGATIVE Final  ? Influenza B by PCR NEGATIVE NEGATIVE Final  ?  Comment: (NOTE) ?The Xpert Xpress SARS-CoV-2/FLU/RSV plus assay is intended as an aid ?in the diagnosis of influenza from Nasopharyngeal swab specimens and ?should not be used as a sole basis for treatment. Nasal washings and ?aspirates are unacceptable for Xpert Xpress SARS-CoV-2/FLU/RSV ?testing. ? ?Fact Sheet for Patients: ?EntrepreneurPulse.com.au ? ?Fact Sheet for Healthcare Providers: ?IncredibleEmployment.be ? ?This test is not yet approved or cleared by the [place] FDA and ?has been authorized for detection and/or diagnosis of SARS-CoV-2 by ?FDA under an Emergency Use Authorization (EUA). This EUA will remain ?in effect (meaning this test can be used) for the duration of the ?COVID-19 declaration under Section 564(b)(1) of the Act, 21 U.S.C. ?section 360bbb-3(b)(1), unless the authorization is terminated or ?revoked. ? ?Performed at [facility], [place], ?[place] [address] ?  ?Expectorated Sputum Assessment w Gram Stain, Rflx to Resp Cult     Status: None  ? Collection Time: 01/03/22  5:06 AM  ? Specimen: Expectorated Sputum  ?Result Value Ref Range Status  ? Specimen Description EXPECTORATED SPUTUM  Final  ? Special Requests NONE  Final  ? Sputum  evaluation   Final  ?  Sputum specimen not acceptable for testing.  Please recollect.   ?RESULT CALLED TO, READ BACK BY AND VERIFIED WITH: VENESSA ASHLEY 01/03/22 0558  MW ?Performed at [facility], [address], [place], [place] [address] ?  ? Report Status 01/03/2022 FINAL  Final  ? ? ?Radiology Studies:  ?DG Chest 2 View ? ?Result Date: 01/03/2022 ?CLINICAL DATA:  Chest pain. EXAM: CHEST  - 2 VIEW COMPARISON:  Chest radiograph dated 12/28/2021. FINDINGS: No focal consolidation, pleural effusion, pneumothorax. The cardiac silhouette is within normal limits. No acute osseous pathology. IMPRESSION: No active ca

## 2022-01-03 NOTE — Assessment & Plan Note (Signed)
May be secondary to lung mass versus pneumonia. ?Symptomatic treatment. ?Seems to have improved. ?

## 2022-01-03 NOTE — TOC Initial Note (Signed)
Transition of Care (TOC) - Initial/Assessment Note  ? ? ?Patient Details  ?Name: Jose Morse ?MRN: [id number] ?Date of Birth: [birth year] ? ?Transition of Care (TOC) CM/SW Contact:    ?Pete Pelt, RN ?Phone Number: ?01/03/2022, 2:12 PM ? ?Clinical Narrative:    Patient states he lives with family, and has assistance at home.  He feels safe and comfortable discharging home when medically appropriate.  ? ?Patient has no concerns about medication or transportation. ? ?[name] states that he does not  currently have a PCP.  Information regarding PCP from [facility] website (in network providers) given to patient   RNCM stated that this information was from the website and we cannot guarantee all in network or accepting new patients, patient and family verbally understood. ? ?Patient declined TOC needs at this time.  TOC contact information provided in the event circumstances change or if patient has other questions or concerns.             ? ? ?Expected Discharge Plan: Home/Self Care ?Barriers to Discharge: Continued Medical Work up ? ? ?Patient Goals and CMS Choice ?  ?  ?Choice offered to / list presented to : NA ? ?Expected Discharge Plan and Services ?Expected Discharge Plan: Home/Self Care ?In-house Referral: PCP / [facility] Connect ?Discharge Planning Services: CM Consult ?  ?Living arrangements for the past 2 months: [place] ?                ?  ?  ?  ?  ?  ?  ?  ?  ?  ?  ? ?Prior Living Arrangements/Services ?Living arrangements for the past 2 months: [place] ?Lives with:: Self, Relatives ?Patient language and need for interpreter reviewed:: Yes (No interpreter required) ?Do you feel safe going back to the place where you live?: Yes      ?Need for Family Participation in Patient Care: Yes (Comment) ?Care giver support system in place?: Yes (comment) ?  ?Criminal Activity/Legal Involvement Pertinent to Current Situation/Hospitalization: No - Comment as needed ? ?Activities of  Daily Living ?Home Assistive Devices/Equipment: Dentures (specify type) ?ADL Screening (condition at time of admission) ?Patient's cognitive ability adequate to safely complete daily activities?: Yes ?Is the patient deaf or have difficulty hearing?: No ?Does the patient have difficulty seeing, even when wearing glasses/contacts?: No ?Does the patient have difficulty concentrating, remembering, or making decisions?: No ?Patient able to express need for assistance with ADLs?: Yes ?Does the patient have difficulty dressing or bathing?: No ?Independently performs ADLs?: Yes (appropriate for developmental age) ?Does the patient have difficulty walking or climbing stairs?: No ?Weakness of Legs: None ?Weakness of Arms/Hands: None ? ?Permission Sought/Granted ?Permission sought to share information with : Case Manager ?Permission granted to share information with : Yes, Verbal Permission Granted ?   ?   ?   ?   ? ?Emotional Assessment ?Appearance:: Appears stated age ?Attitude/Demeanor/Rapport: Engaged ?Affect (typically observed): Appropriate ?Orientation: : Oriented to Situation, Oriented to  Time, Oriented to Place, Oriented to Self ?Alcohol / Substance Use: Not Applicable ?Psych Involvement: No (comment) ? ?Admission diagnosis:  Lung mass [R91.8] ?Postobstructive pneumonia [J18.9] ?Patient Active Problem List  ? Diagnosis Date Noted  ? Postobstructive pneumonia 01/03/2022  ? 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 Pleuritic chest pain 01/03/2022  ? FOLLICULITIS 63/33/5456  ? CELLULITIS AND ABSCESS OF FACE 01/15/2008  ? ANXIETY 11/08/2007  ? HYPERLIPIDEMIA 07/15/2007  ? Tobacco abuse 07/15/2007  ? ?PCP:  Pcp, No ?Pharmacy:   ?  [id number] [address], [place] [address] ?Phone: [phone] Fax: [phone] ? ? ? ? ?Social Determinants of Health (SDOH) Interventions ?  ? ?Readmission Risk Interventions ?No flowsheet data found. ? ? ?

## 2022-01-03 NOTE — ED Provider Notes (Signed)
? ?[facility] ?Provider Note ? ? ? Event Date/Time  ? First MD Initiated Contact with Patient 01/03/22 0043   ?  (approximate) ? ? ?History  ? ?Chest Pain ? ? ?HPI ? ?Jose Morse is a 51 y.o. male with history of tobacco use who presents to the emergency department with complaints of severe left-sided chest pain for the past 2 to 3 days.  States he feels like it is a "grinding" pain that is worse with deep inspiration.  States he is also feeling short of breath and having a productive cough.  Was just seen in the emergency department on March 8 for similar symptoms and was diagnosed with a left upper lobe pneumonia seen on chest x-ray.  He was discharged on prednisone and azithromycin and states that his symptoms improved but after finished the antibiotics his symptoms came back and now felt worse.  No history of PE, DVT.  No lower extremity swelling or pain.  He denies any known fevers. ? ? ?History provided by patient. ? ? ? ?History reviewed. No pertinent past medical history. ? ?Past Surgical History:  ?Procedure Laterality Date  ? CHOLECYSTECTOMY    ? ? ?MEDICATIONS:  ?Prior to Admission medications   ?Medication Sig Start Date End Date Taking? Authorizing Provider  ?albuterol (PROVENTIL HFA) 108 (90 Base) MCG/ACT inhaler Inhale 2 puffs into the lungs every 4 (four) hours as needed for wheezing or shortness of breath. 12/28/21   Carrie Mew, MD  ?albuterol (PROVENTIL HFA;VENTOLIN HFA) 108 (90 Base) MCG/ACT inhaler Inhale 2 puffs into the lungs every 4 (four) hours as needed for wheezing or shortness of breath (cough). 12/29/16   Drenda Freeze, MD  ?azithromycin (ZITHROMAX Z-PAK) 250 MG tablet Take 2 tablets (500 mg) on  Day 1,  followed by 1 tablet (250 mg) once daily on Days 2 through 5. 12/28/21   Carrie Mew, MD  ?HYDROcodone-acetaminophen (NORCO) 5-325 MG tablet Take 1 tablet by mouth every 6 (six) hours as needed. 07/18/16   Menshew, Dannielle Karvonen, PA-C  ? ? ?Physical  Exam  ? ?Triage Vital Signs: ?ED Triage Vitals  ?Enc Vitals Group  ?   BP 01/02/22 2325 (!) 148/82  ?   Pulse Rate 01/02/22 2325 73  ?   Resp 01/02/22 2325 20  ?   Temp 01/02/22 2325 (!) 97.5 ?F (36.4 ?C)  ?   Temp Source 01/02/22 2325 Oral  ?   SpO2 01/02/22 2325 95 %  ?   Weight 01/02/22 2326 145 lb 1 oz (65.8 kg)  ?   Height 01/02/22 2326 5\' 7"  (1.702 m)  ?   Head Circumference --   ?   Peak Flow --   ?   Pain Score 01/02/22 2325 10  ?   Pain Loc --   ?   Pain Edu? --   ?   Excl. in [place]? --   ? ? ?Most recent vital signs: ?Vitals:  ? 01/03/22 0107 01/03/22 0200  ?BP: (!) 133/92 (!) 133/92  ?Pulse: 62 (!) 57  ?Resp: (!) 24 13  ?Temp:    ?SpO2: 97% 98%  ? ? ?CONSTITUTIONAL: Alert and oriented and responds appropriately to questions. Well-appearing; well-nourished ?HEAD: Normocephalic, atraumatic ?EYES: Conjunctivae clear, pupils appear equal, sclera nonicteric ?ENT: normal nose; moist mucous membranes ?NECK: Supple, normal ROM ?CARD: RRR; S1 and S2 appreciated; no murmurs, no clicks, no rubs, no gallops ?RESP: Normal chest excursion without splinting or tachypnea; breath sounds clear and  equal bilaterally; no wheezes, no rhonchi, no rales, no hypoxia or respiratory distress, speaking full sentences ?ABD/GI: Normal bowel sounds; non-distended; soft, non-tender, no rebound, no guarding, no peritoneal signs ?BACK: The back appears normal ?EXT: Normal ROM in all joints; no deformity noted, no edema; no cyanosis, no calf tenderness or calf swelling ?SKIN: Normal color for age and race; warm; no rash on exposed skin ?NEURO: Moves all extremities equally, normal speech ?PSYCH: The patient's mood and manner are appropriate. ? ? ?ED Results / Procedures / Treatments  ? ?LABS: ?(all labs ordered are listed, but only abnormal results are displayed) ?Labs Reviewed  ?BASIC METABOLIC PANEL - Abnormal; Notable for the following components:  ?    Result Value  ? Potassium 3.4 (*)   ? Glucose, Bld 111 (*)   ? All other components  within normal limits  ?CBC - Abnormal; Notable for the following components:  ? WBC 11.8 (*)   ? All other components within normal limits  ?RESP PANEL BY RT-PCR (FLU A&B, COVID) [id number]  ?EXPECTORATED SPUTUM ASSESSMENT W GRAM STAIN, RFLX TO RESP C  ?PROCALCITONIN  ?HIV ANTIBODY (ROUTINE TESTING W REFLEX)  ?LEGIONELLA PNEUMOPHILA SEROGP 1 UR AG  ?STREP PNEUMONIAE URINARY ANTIGEN  ?TROPONIN I (HIGH SENSITIVITY)  ?TROPONIN I (HIGH SENSITIVITY)  ? ? ? ?EKG: ? ? Date: 01/02/2022 23:57 ? Rate: 76 ? Rhythm: normal sinus rhythm ? QRS Axis: normal ? Intervals: normal ? ST/T Wave abnormalities: Unspecific lateral ST changes ? Conduction Disutrbances: none ? Narrative Interpretation: Nonspecific ST changes noted in lateral leads.  Q waves seen inferiorly which is unchanged compared to previous. ? ? ? ? ?RADIOLOGY: ?My personal review and interpretation of imaging: Chest x-ray clear.  CTA of the chest shows a left-sided lung mass with postobstructive pneumonia but no PE. ? ?I have personally reviewed all radiology reports.   ?DG Chest 2 View ? ?Result Date: 01/03/2022 ?CLINICAL DATA:  Chest pain. EXAM: CHEST - 2 VIEW COMPARISON:  Chest radiograph dated 12/28/2021. FINDINGS: No focal consolidation, pleural effusion, pneumothorax. The cardiac silhouette is within normal limits. No acute osseous pathology. IMPRESSION: No active cardiopulmonary disease. Electronically Signed   By: Anner Crete M.D.   On: 01/03/2022 00:21  ? ?CT Angio Chest PE W and/or Wo Contrast ? ?Result Date: 01/03/2022 ?CLINICAL DATA:  Chest pain.  Concern for pulmonary embolism. EXAM: CT ANGIOGRAPHY CHEST WITH CONTRAST TECHNIQUE: Multidetector CT imaging of the chest was performed using the standard protocol during bolus administration of intravenous contrast. Multiplanar CT image reconstructions and MIPs were obtained to evaluate the vascular anatomy. RADIATION DOSE REDUCTION: This exam was performed according to the departmental dose-optimization program  which includes automated exposure control, adjustment of the mA and/or kV according to patient size and/or use of iterative reconstruction technique. CONTRAST:  43mL OMNIPAQUE IOHEXOL 350 MG/ML SOLN COMPARISON:  Chest radiograph dated 01/03/2022. FINDINGS: Cardiovascular: There is no cardiomegaly or pericardial effusion. There is retrograde flow of contrast from the right atrium into the IVC. The thoracic aorta is unremarkable but suboptimally evaluated due to nonopacification and timing of the contrast. No pulmonary artery embolus identified. Mediastinum/Nodes: There is a 2.6 x 5.0 cm soft tissue mass in the left hilum extending to the mediastinum. This may represent a cluster of enlarged lymph nodes but concerning for malignancy. There is associated mass effect and narrowing of the left upper lobe bronchi. This mass abuts the left mainstem bronchus. There is also loss of fat plane between this mass and a portion of  the esophagus (49/4). No mediastinal fluid collection. Lungs/Pleura: Nodular and streaky densities extending from the left hilum into the left upper lobe may represent postobstructive pneumonia. There is impaction of several left upper lobe bronchi. The right lung is clear. Trace left pleural effusion. No pneumothorax. The central airways remain patent. Upper Abdomen: Punctate nonobstructing left renal upper pole calculus. Musculoskeletal: No acute osseous pathology. Review of the MIP images confirms the above findings. IMPRESSION: 1. No CT evidence of pulmonary artery embolus. 2. Left hilar/mediastinal mass/malignancy and adenopathy. Bronchoscopy may provide better evaluation and assist in tissue sampling. Multidisciplinary consult is advised. 3. Probable left upper lobe postobstructive pneumonia. Trace left pleural effusion. Electronically Signed   By: Anner Crete M.D.   On: 01/03/2022 01:49   ? ? ?PROCEDURES: ? ?Critical Care performed: No ? ? ?CRITICAL CARE ?Performed by: Cyril Mourning  Denario Bagot ? ? ?Total critical care time: 0 minutes ? ?Critical care time was exclusive of separately billable procedures and treating other patients. ? ?Critical care was necessary to treat or prevent imminent or life-thr

## 2022-01-03 NOTE — H&P (Signed)
?History and Physical  ? ? ?DEMITRI KUCINSKI JOI:[id number] DOB: 1971/03/08 DOA: 01/03/2022 ? ?PCP: Pcp, No  ? ?Patient coming from: Home  ? ?Chief Complaint: Pleuritic chest pain, cough  ? ?HPI: Jose KUIKEN is a very pleasant 51 y.o. male with medical history significant for tobacco abuse, now presenting to emergency department for evaluation of chest pain.  Patient reports that he had been in his usual state of health until 12/26/2021 when he developed pain in the left chest that was worse with deep breath.  He was seen in the emergency department on 12/28/2021, diagnosed with pneumonia, and discharged home with azithromycin, prednisone, and albuterol.  He has continued to experience pleuritic pain on the left, and is also experiencing some pain in his upper back now.  He continues to have a productive cough, denies hemoptysis, denies fever or chills, and denies any recent weight loss, but did have recent night sweats. ? ?ED Course: Upon arrival to the ED, patient is found to be afebrile and saturating mid 90s on room air with stable blood pressure.  EKG features sinus rhythm, chest x-ray negative for acute findings, and CTA chest negative for PE but notable for left hilar/mediastinal mass and adenopathy concerning for malignancy, and with likely left upper lobe postobstructive pneumonia.  Patient was treated with Rocephin, azithromycin, Toradol, and 1 L of saline in the ED. ? ?Review of Systems:  ?All other systems reviewed and apart from HPI, are negative. ? ?History reviewed. No pertinent past medical history. ? ?Past Surgical History:  ?Procedure Laterality Date  ? CHOLECYSTECTOMY    ? ? ?Social History:  ? reports that he has been smoking cigarettes. He has a 34.00 pack-year smoking history. He has never used smokeless tobacco. He reports that he does not drink alcohol. No history on file for drug use. ? ?Allergies  ?Allergen Reactions  ? Codeine   ?  REACTION: Nausea  ? ? ?Family History  ?Problem Relation Age  of Onset  ? Ovarian cancer Mother   ? Colon cancer Father   ? Diabetes Father   ? ? ? ?Prior to Admission medications   ?Medication Sig Start Date End Date Taking? Authorizing Provider  ?albuterol (PROVENTIL HFA) 108 (90 Base) MCG/ACT inhaler Inhale 2 puffs into the lungs every 4 (four) hours as needed for wheezing or shortness of breath. 12/28/21  Yes Carrie Mew, MD  ?albuterol (PROVENTIL HFA;VENTOLIN HFA) 108 (90 Base) MCG/ACT inhaler Inhale 2 puffs into the lungs every 4 (four) hours as needed for wheezing or shortness of breath (cough). 12/29/16  Yes Drenda Freeze, MD  ?azithromycin (ZITHROMAX Z-PAK) 250 MG tablet Take 2 tablets (500 mg) on  Day 1,  followed by 1 tablet (250 mg) once daily on Days 2 through 5. ?Patient not taking: Reported on 01/03/2022 12/28/21   Carrie Mew, MD  ? ? ?Physical Exam: ?Vitals:  ? 01/02/22 2325 01/02/22 2326 01/03/22 0107 01/03/22 0200  ?BP: (!) 148/82  (!) 133/92 (!) 133/92  ?Pulse: 73  62 (!) 57  ?Resp: 20  (!) 24 13  ?Temp: (!) 97.5 ?F (36.4 ?C)     ?TempSrc: Oral     ?SpO2: 95%  97% 98%  ?Weight:  65.8 kg    ?Height:  5\' 7"  (1.702 m)    ? ? ?Constitutional: NAD, calm  ?Eyes: PERTLA, lids and conjunctivae normal ?ENMT: Mucous membranes are moist. Posterior pharynx clear of any exudate or lesions.   ?Neck: supple, no masses  ?Respiratory: no  wheezing, no crackles. No accessory muscle use.  ?Cardiovascular: S1 & S2 heard, regular rate and rhythm. No extremity edema.   ?Abdomen: No distension, no tenderness, soft. Bowel sounds active.  ?Musculoskeletal: no clubbing / cyanosis. No joint deformity upper and lower extremities.   ?Skin: no significant rashes, lesions, ulcers. Warm, dry, well-perfused. ?Neurologic: CN 2-12 grossly intact. Moving all extremities. Alert and oriented.  ?Psychiatric: Very pleasant. Cooperative.  ? ? ?Labs and Imaging on Admission: I have personally reviewed following labs and imaging studies ? ?CBC: ?Recent Labs  ?Lab 12/28/21 ?0900  01/02/22 ?2327  ?WBC 12.3* 11.8*  ?HGB 15.8 15.2  ?HCT 47.8 45.6  ?MCV 88.8 89.4  ?PLT 254 249  ? ?Basic Metabolic Panel: ?Recent Labs  ?Lab 12/28/21 ?0900 01/02/22 ?2327  ?NA 141 139  ?K 3.8 3.4*  ?CL 107 103  ?CO2 26 26  ?GLUCOSE 98 111*  ?BUN 7 19  ?CREATININE 0.88 1.03  ?CALCIUM 9.4 9.0  ? ?GFR: ?Estimated Creatinine Clearance: 80.7 mL/min (by C-G formula based on SCr of 1.03 mg/dL). ?Liver Function Tests: ?No results for input(s): AST, ALT, ALKPHOS, BILITOT, PROT, ALBUMIN in the last 168 hours. ?No results for input(s): LIPASE, AMYLASE in the last 168 hours. ?No results for input(s): AMMONIA in the last 168 hours. ?Coagulation Profile: ?No results for input(s): INR, PROTIME in the last 168 hours. ?Cardiac Enzymes: ?No results for input(s): CKTOTAL, CKMB, CKMBINDEX, TROPONINI in the last 168 hours. ?BNP (last 3 results) ?No results for input(s): PROBNP in the last 8760 hours. ?HbA1C: ?No results for input(s): HGBA1C in the last 72 hours. ?CBG: ?No results for input(s): GLUCAP in the last 168 hours. ?Lipid Profile: ?No results for input(s): CHOL, HDL, LDLCALC, TRIG, CHOLHDL, LDLDIRECT in the last 72 hours. ?Thyroid Function Tests: ?No results for input(s): TSH, T4TOTAL, FREET4, T3FREE, THYROIDAB in the last 72 hours. ?Anemia Panel: ?No results for input(s): VITAMINB12, FOLATE, FERRITIN, TIBC, IRON, RETICCTPCT in the last 72 hours. ?Urine analysis: ?   ?Component Value Date/Time  ? COLORURINE YELLOW 04/05/2009 0800  ? APPEARANCEUR CLEAR 04/05/2009 0800  ? LABSPEC 1.008 04/05/2009 0800  ? PHURINE 6.5 04/05/2009 0800  ? GLUCOSEU NEGATIVE 04/05/2009 0800  ? GLUCOSEU NEGATIVE 11/08/2007 1524  ? HGBUR NEGATIVE 04/05/2009 0800  ? BILIRUBINUR NEGATIVE 04/05/2009 0800  ? KETONESUR NEGATIVE 04/05/2009 0800  ? PROTEINUR NEGATIVE 04/05/2009 0800  ? UROBILINOGEN 1.0 04/05/2009 0800  ? NITRITE NEGATIVE 04/05/2009 0800  ? LEUKOCYTESUR  04/05/2009 0800  ?  NEGATIVE MICROSCOPIC NOT DONE ON URINES WITH NEGATIVE PROTEIN, BLOOD,  LEUKOCYTES, NITRITE, OR GLUCOSE <1000 mg/dL.  ? ?Sepsis Labs: ?@LABRCNTIP (procalcitonin:4,lacticidven:4) ?) ?Recent Results (from the past 240 hour(s))  ?Resp Panel by RT-PCR (Flu A&B, Covid) Nasopharyngeal Swab     Status: None  ? Collection Time: 01/03/22  2:25 AM  ? Specimen: Nasopharyngeal Swab; Nasopharyngeal(NP) swabs in vial transport medium  ?Result Value Ref Range Status  ? SARS Coronavirus 2 by RT PCR NEGATIVE NEGATIVE Final  ?  Comment: (NOTE) ?SARS-CoV-2 target nucleic acids are NOT DETECTED. ? ?The SARS-CoV-2 RNA is generally detectable in upper respiratory ?specimens during the acute phase of infection. The lowest ?concentration of SARS-CoV-2 viral copies this assay can detect is ?138 copies/mL. A negative result does not preclude SARS-Cov-2 ?infection and should not be used as the sole basis for treatment or ?other patient management decisions. A negative result may occur with  ?improper specimen collection/handling, submission of specimen other ?than nasopharyngeal swab, presence of viral mutation(s) within the ?areas targeted by this  assay, and inadequate number of viral ?copies(<138 copies/mL). A negative result must be combined with ?clinical observations, patient history, and epidemiological ?information. The expected result is Negative. ? ?Fact Sheet for Patients:  ?EntrepreneurPulse.com.au ? ?Fact Sheet for Healthcare Providers:  ?IncredibleEmployment.be ? ?This test is no t yet approved or cleared by the [place] FDA and  ?has been authorized for detection and/or diagnosis of SARS-CoV-2 by ?FDA under an Emergency Use Authorization (EUA). This EUA will remain  ?in effect (meaning this test can be used) for the duration of the ?COVID-19 declaration under Section 564(b)(1) of the Act, 21 ?U.S.C.section 360bbb-3(b)(1), unless the authorization is terminated  ?or revoked sooner.  ? ? ?  ? Influenza A by PCR NEGATIVE NEGATIVE Final  ? Influenza B by PCR  NEGATIVE NEGATIVE Final  ?  Comment: (NOTE) ?The Xpert Xpress SARS-CoV-2/FLU/RSV plus assay is intended as an aid ?in the diagnosis of influenza from Nasopharyngeal swab specimens and ?should not be used as a sole basis

## 2022-01-03 NOTE — Assessment & Plan Note (Signed)
Replace and follow-up ?

## 2022-01-03 NOTE — Assessment & Plan Note (Addendum)
Absolute cessation counseled. ?Offered nicotine patch but patient declined. ?

## 2022-01-03 NOTE — Consult Note (Signed)
? ? ? ?PULMONOLOGY ? ? ? ? ? ? ? ? ?Date: 01/03/2022,   ?MRN# [id number] Jose Morse 1971/10/12 ? ? ?  ?AdmissionWeight: 65.8 kg                 ?CurrentWeight: 65.8 kg ? ? ?Referring physician: Dr Lavella Lemons ? ? ?CHIEF COMPLAINT:  ? ?Post obstructive pneumonia with mediastinal lymphadenopathy ? ? ?HISTORY OF PRESENT ILLNESS  ? ?51 yo M with hx of tobacco smoking, hx of tick borne illness, anxiety disorder, dyslipidemia, hx of cholecystectomy came in with complaints of L sided chest pain to ED noted to have signs of LRTI and diagnosed with pneumonia 1 wk prior to admission s/p treatment with zithromax and prednisone as well as albuterol.  He came in this time with continued CP not improved post CAP treatmetn.  CT chest was done and I reviewed this independently with Left lung infiltrate and possible post obstructive component.  Additional details as below:  There is a 2.6 x 5.0 cm soft tissue mass in the  left hilum extending to the mediastinum. This may represent a cluster of enlarged lymph nodes but concerning for malignancy. There  is associated mass effect and narrowing of the left upper lobe ?bronchi. This mass abuts the left mainstem bronchus. There is also ?loss of fat plane between this mass and a portion of the esophagus ? ?Patient is stable on room air.  He is able to speak in full sentences.  We reviewed imaging together and medical plan. I met with family separately and reviewed plan with daughter and wife.  ? ?PAST MEDICAL HISTORY  ? ?History reviewed. No pertinent past medical history. ? ? ?SURGICAL HISTORY  ? ?Past Surgical History:  ?Procedure Laterality Date  ? CHOLECYSTECTOMY    ? ? ? ?FAMILY HISTORY  ? ?Family History  ?Problem Relation Age of Onset  ? Ovarian cancer Mother   ? Colon cancer Father   ? Diabetes Father   ? ? ? ?SOCIAL HISTORY  ? ?Social History  ? ?Tobacco Use  ? Smoking status: Every Day  ?  Packs/day: 1.00  ?  Years: 34.00  ?  Pack years: 34.00  ?  Types: Cigarettes  ? Smokeless  tobacco: Never  ?Substance Use Topics  ? Alcohol use: No  ? ? ? ?MEDICATIONS  ? ? ?Home Medication:  ?Current Outpatient Rx  ? Order #: [id number] Class: Print  ? Order #: [id number] Class: Print  ? Order #: [id number] Class: Print  ?  ?Current Medication: ? ?Current Facility-Administered Medications:  ?  acetaminophen (TYLENOL) tablet 650 mg, 650 mg, Oral, Q6H PRN **OR** acetaminophen (TYLENOL) suppository 650 mg, 650 mg, Rectal, Q6H PRN, Opyd, Ilene Qua, MD ?  albuterol (PROVENTIL) (2.5 MG/3ML) 0.083% nebulizer solution 2.5 mg, 2.5 mg, Nebulization, Q4H PRN, Opyd, Ilene Qua, MD ?  [START ON 01/04/2022] azithromycin (ZITHROMAX) tablet 500 mg, 500 mg, Oral, Daily, Opyd, Ilene Qua, MD ?  [START ON 01/04/2022] cefTRIAXone (ROCEPHIN) 2 g in sodium chloride 0.9 % 100 mL IVPB, 2 g, Intravenous, Q24H, Opyd, Ilene Qua, MD ?  fentaNYL (SUBLIMAZE) injection 50 mcg, 50 mcg, Intravenous, Q2H PRN, Opyd, Ilene Qua, MD ?  ondansetron (ZOFRAN) tablet 4 mg, 4 mg, Oral, Q6H PRN **OR** ondansetron (ZOFRAN) injection 4 mg, 4 mg, Intravenous, Q6H PRN, Opyd, Timothy S, MD ?  oxyCODONE (Oxy IR/ROXICODONE) immediate release tablet 5 mg, 5 mg, Oral, Q4H PRN, Opyd, Ilene Qua, MD ?  senna-docusate (Senokot-S) tablet 1 tablet, 1 tablet, Oral, QHS PRN,  Vianne Bulls, MD ? ?Current Outpatient Medications:  ?  albuterol (PROVENTIL HFA) 108 (90 Base) MCG/ACT inhaler, Inhale 2 puffs into the lungs every 4 (four) hours as needed for wheezing or shortness of breath., Disp: 1 each, Rfl: 0 ?  albuterol (PROVENTIL HFA;VENTOLIN HFA) 108 (90 Base) MCG/ACT inhaler, Inhale 2 puffs into the lungs every 4 (four) hours as needed for wheezing or shortness of breath (cough)., Disp: 1 Inhaler, Rfl: 0 ?  azithromycin (ZITHROMAX Z-PAK) 250 MG tablet, Take 2 tablets (500 mg) on  Day 1,  followed by 1 tablet (250 mg) once daily on Days 2 through 5. (Patient not taking: Reported on 01/03/2022), Disp: 6 each, Rfl: 0 ? ? ? ?ALLERGIES  ? ?Codeine ? ? ? ? ?REVIEW OF SYSTEMS   ? ? ?Review of Systems: ? ?Gen:  Denies  fever, sweats, chills weigh loss  ?HEENT: Denies blurred vision, double vision, ear pain, eye pain, hearing loss, nose bleeds, sore throat ?Cardiac:  No dizziness, chest pain or heaviness, chest tightness,edema ?Resp:   reports mild left sided chest discomfort ?Gi: Denies swallowing difficulty, stomach pain, nausea or vomiting, diarrhea, constipation, bowel incontinence ?Gu:  Denies bladder incontinence, burning urine ?Ext:   Denies Joint pain, stiffness or swelling ?Skin: Denies  skin rash, easy bruising or bleeding or hives ?Endoc:  Denies polyuria, polydipsia , polyphagia or weight change ?Psych:   Denies depression, insomnia or hallucinations  ? ?Other:  All other systems negative ? ? ?VS: BP 121/82   Pulse 60   Temp 97.8 ?F (36.6 ?C) (Oral)   Resp 16   Ht $R'5\' 7"'Ov$  (1.702 m)   Wt 65.8 kg   SpO2 96%   BMI 22.72 kg/m?   ? ? ? ?PHYSICAL EXAM  ? ? ?GENERAL:NAD, no fevers, chills, no weakness no fatigue ?HEAD: Normocephalic, atraumatic.  ?EYES: Pupils equal, round, reactive to light. Extraocular muscles intact. No scleral icterus.  ?MOUTH: Moist mucosal membrane. Dentition intact. No abscess noted.  ?EAR, NOSE, THROAT: Clear without exudates. No external lesions.  ?NECK: Supple. No thyromegaly. No nodules. No JVD.  ?PULMONARY: mild rhonchi left lung ?CARDIOVASCULAR: S1 and S2. Regular rate and rhythm. No murmurs, rubs, or gallops. No edema. Pedal pulses 2+ bilaterally.  ?GASTROINTESTINAL: Soft, nontender, nondistended. No masses. Positive bowel sounds. No hepatosplenomegaly.  ?MUSCULOSKELETAL: No swelling, clubbing, or edema. Range of motion full in all extremities.  ?NEUROLOGIC: Cranial nerves II through XII are intact. No gross focal neurological deficits. Sensation intact. Reflexes intact.  ?SKIN: No ulceration, lesions, rashes, or cyanosis. Skin warm and dry. Turgor intact.  ?PSYCHIATRIC: Mood, affect within normal limits. The patient is awake, alert and oriented x 3.  Insight, judgment intact.  ? ? ?  ? ?IMAGING  ? ? ?DG Chest 2 View ? ?Result Date: 01/03/2022 ?CLINICAL DATA:  Chest pain. EXAM: CHEST - 2 VIEW COMPARISON:  Chest radiograph dated 12/28/2021. FINDINGS: No focal consolidation, pleural effusion, pneumothorax. The cardiac silhouette is within normal limits. No acute osseous pathology. IMPRESSION: No active cardiopulmonary disease. Electronically Signed   By: Anner Crete M.D.   On: 01/03/2022 00:21  ? ?DG Chest 2 View ? ?Result Date: 12/28/2021 ?CLINICAL DATA:  Chest pain. EXAM: CHEST - 2 VIEW COMPARISON:  December 29, 2016. FINDINGS: The heart size and mediastinal contours are within normal limits. Right lung is clear. Small ill-defined opacity seen peripherally in the left upper lobe. The visualized skeletal structures are unremarkable. IMPRESSION: Small ill-defined opacity seen peripherally in left upper lobe concerning for  possible pneumonia. Followup PA and lateral chest X-ray is recommended in 3-4 weeks following trial of antibiotic therapy to ensure resolution and exclude underlying malignancy. Electronically Signed   By: Marijo Conception M.D.   On: 12/28/2021 09:26  ? ?CT Angio Chest PE W and/or Wo Contrast ? ?Result Date: 01/03/2022 ?CLINICAL DATA:  Chest pain.  Concern for pulmonary embolism. EXAM: CT ANGIOGRAPHY CHEST WITH CONTRAST TECHNIQUE: Multidetector CT imaging of the chest was performed using the standard protocol during bolus administration of intravenous contrast. Multiplanar CT image reconstructions and MIPs were obtained to evaluate the vascular anatomy. RADIATION DOSE REDUCTION: This exam was performed according to the departmental dose-optimization program which includes automated exposure control, adjustment of the mA and/or kV according to patient size and/or use of iterative reconstruction technique. CONTRAST:  32mL OMNIPAQUE IOHEXOL 350 MG/ML SOLN COMPARISON:  Chest radiograph dated 01/03/2022. FINDINGS: Cardiovascular: There is no cardiomegaly  or pericardial effusion. There is retrograde flow of contrast from the right atrium into the IVC. The thoracic aorta is unremarkable but suboptimally evaluated due to nonopacification and timing of the contrast.

## 2022-01-04 LAB — COMPREHENSIVE METABOLIC PANEL
ALT: 23 U/L (ref 0–44)
AST: 14 U/L — ABNORMAL LOW (ref 15–41)
Albumin: 3.2 g/dL — ABNORMAL LOW (ref 3.5–5.0)
Alkaline Phosphatase: 88 U/L (ref 38–126)
Anion gap: 7 (ref 5–15)
BUN: 15 mg/dL (ref 6–20)
CO2: 24 mmol/L (ref 22–32)
Calcium: 9 mg/dL (ref 8.9–10.3)
Chloride: 106 mmol/L (ref 98–111)
Creatinine, Ser: 0.69 mg/dL (ref 0.61–1.24)
GFR, Estimated: >60 mL/min (ref >60)
Glucose, Bld: 117 mg/dL — ABNORMAL HIGH (ref 70–99)
Potassium: 3.9 mmol/L (ref 3.5–5.1)
Sodium: 137 mmol/L (ref 135–145)
Total Bilirubin: 0.7 mg/dL (ref 0.3–1.2)
Total Protein: 7.1 g/dL (ref 6.5–8.1)

## 2022-01-04 LAB — C-REACTIVE PROTEIN: CRP: 2.9 mg/dL — ABNORMAL HIGH (ref <1.0)

## 2022-01-04 LAB — PROCALCITONIN: Procalcitonin: <0.1 ng/mL

## 2022-01-04 LAB — MAGNESIUM: Magnesium: 2 mg/dL (ref 1.7–2.4)

## 2022-01-04 LAB — LEGIONELLA PNEUMOPHILA SEROGP 1 UR AG: L. pneumophila Serogp 1 Ur Ag: NEGATIVE

## 2022-01-04 MED ORDER — AMOXICILLIN-POT CLAVULANATE 875-125 MG PO TABS: 1.0000 | ORAL_TABLET | Freq: Two times a day (BID) | ORAL | 0 refills | Status: AC

## 2022-01-04 NOTE — Discharge Summary (Signed)
?Physician Discharge Summary ?  ?Patient: Jose Morse MRN: [id number] DOB: 05-10-1971  ?Admit date:     01/03/2022  ?Discharge date: 01/04/22  ?Discharge Physician: Max Sane  ? ?PCP: Pcp, No  ? ?Recommendations at discharge:  ? ?Follow-up with outpatient provider as requested ? ?Discharge Diagnoses: ?Principal Problem: ?  Lung mass ?Active Problems: ?  Postobstructive pneumonia ?  Pleuritic chest pain ?  Tobacco abuse ?  Hypokalemia ? ?Hospital Course: ?51 year old male with medical history significant for heavy tobacco use disorder, presented to the emergency room for evaluation of left-sided chest pain and cough.  Pleuritic type chest pain started on 3/6, seen in ED on 3/8, diagnosed with pneumonia and discharged on azithromycin, prednisone and albuterol inhaler but symptoms persisted.  CTA chest showed left hilar/mediastinal mass and adenopathy concerning for malignancy and left upper lobe postobstructive pneumonia.  Started on empiric IV antibiotics.  Pulmonology consulted for evaluation and management. ? ?Assessment and Plan: ?* Lung mass ?Left-sided pleuritic type chest pain. ?CTA chest 3/14: No PE but confirmed left hilar/mediastinal mass/malignancy and adenopathy. ?Given history of prolonged and heavy smoking, highly suspicious for malignancy. ?Pulmonology consulted.  Discussed with Dr. Lanney Gins who recommends first treating aggressively with antimicrobials for pneumonia followed by PET scan and bronchoscopy as outpatient.   ? ?Postobstructive pneumonia ?Improving with antibiotics.  He is on room air, afebrile and no leukocytosis. ? ?Pleuritic chest pain ?May be secondary to lung mass versus pneumonia. ?No pain now. ? ?Tobacco abuse ?Absolute cessation counseled. ?Offered nicotine patch but patient declined. ? ?Hypokalemia ?Replaced ? ? ? ? ?  ? ? ?Consultants: Pulmonary ?Disposition: Home ?Diet recommendation:  ?Discharge Diet Orders (From admission, onward)  ? ?  Start     Ordered  ? 01/04/22 0000   Diet - low sodium heart healthy       ? 01/04/22 1033  ? ?  ?  ? ?  ? ?Cardiac diet ?DISCHARGE MEDICATION: ?Allergies as of 01/04/2022   ? ?   Reactions  ? Codeine   ? REACTION: Nausea  ? ?  ? ?  ?Medication List  ?  ? ?STOP taking these medications   ? ?azithromycin 250 MG tablet ?Commonly known as: Zithromax Z-Pak ?  ? ?  ? ?TAKE these medications   ? ?albuterol 108 (90 Base) MCG/ACT inhaler ?Commonly known as: VENTOLIN HFA ?Inhale 2 puffs into the lungs every 4 (four) hours as needed for wheezing or shortness of breath (cough). ?  ?albuterol 108 (90 Base) MCG/ACT inhaler ?Commonly known as: Proventil HFA ?Inhale 2 puffs into the lungs every 4 (four) hours as needed for wheezing or shortness of breath. ?  ?amoxicillin-clavulanate 875-125 MG tablet ?Commonly known as: Augmentin ?Take 1 tablet by mouth every 12 (twelve) hours for 7 days. ?  ? ?  ? ? Follow-up Information   ? ? Ottie Glazier, MD. Schedule an appointment as soon as possible for a visit in 1 week(s).   ?Specialty: Pulmonary Disease ?Why: [facility] Discharge F/UP ?Contact information: ?[address] ?[place] [address] ?[phone] ? ? ?  ?  ? ? Tracie Harrier, MD. Schedule an appointment as soon as possible for a visit in 2 week(s).   ?Specialty: Internal Medicine ?Why: [facility] Discharge F/UP ?Contact information: ?[address] ?[place] [address] ?[phone] ? ? ?  ?  ? ?  ?  ? ?  ? ?Discharge Exam: ?Filed Weights  ? 01/02/22 2326  ?Weight: 65.8 kg  ? ?  General exam: Young male, well-built and nourished lying comfortably propped up in bed without distress. ?Respiratory system: Diminished breath sounds in the left upper zone, scattered occasional rhonchi bilateral upper anterior chest, left greater than sign right.  No crackles.  Rest of lung fields clear to auscultation.  No increased work of breathing. ?Cardiovascular system: S1 & S2 heard, RRR. No JVD, murmurs, rubs, gallops or clicks. No  pedal edema. ?Gastrointestinal system: Abdomen is nondistended, soft and nontender. No organomegaly or masses felt. Normal bowel sounds heard. ?Central nervous system: Alert and oriented. No focal neurological deficits. ?Extremities: Symmetric 5 x 5 power. ?Skin: No rashes, lesions or ulcers ?Psychiatry: Judgement and insight appear normal. Mood & affect appropriate.  ? ?Condition at discharge: good ? ?The results of significant diagnostics from this hospitalization (including imaging, microbiology, ancillary and laboratory) are listed below for reference.  ? ?Imaging Studies: ?DG Chest 2 View ? ?Result Date: 01/03/2022 ?CLINICAL DATA:  Chest pain. EXAM: CHEST - 2 VIEW COMPARISON:  Chest radiograph dated 12/28/2021. FINDINGS: No focal consolidation, pleural effusion, pneumothorax. The cardiac silhouette is within normal limits. No acute osseous pathology. IMPRESSION: No active cardiopulmonary disease. Electronically Signed   By: Anner Crete M.D.   On: 01/03/2022 00:21  ? ?DG Chest 2 View ? ?Result Date: 12/28/2021 ?CLINICAL DATA:  Chest pain. EXAM: CHEST - 2 VIEW COMPARISON:  December 29, 2016. FINDINGS: The heart size and mediastinal contours are within normal limits. Right lung is clear. Small ill-defined opacity seen peripherally in the left upper lobe. The visualized skeletal structures are unremarkable. IMPRESSION: Small ill-defined opacity seen peripherally in left upper lobe concerning for possible pneumonia. Followup PA and lateral chest X-ray is recommended in 3-4 weeks following trial of antibiotic therapy to ensure resolution and exclude underlying malignancy. Electronically Signed   By: Marijo Conception M.D.   On: 12/28/2021 09:26  ? ?CT Angio Chest PE W and/or Wo Contrast ? ?Result Date: 01/03/2022 ?CLINICAL DATA:  Chest pain.  Concern for pulmonary embolism. EXAM: CT ANGIOGRAPHY CHEST WITH CONTRAST TECHNIQUE: Multidetector CT imaging of the chest was performed using the standard protocol during bolus  administration of intravenous contrast. Multiplanar CT image reconstructions and MIPs were obtained to evaluate the vascular anatomy. RADIATION DOSE REDUCTION: This exam was performed according to the departmental dose-optimization program which includes automated exposure control, adjustment of the mA and/or kV according to patient size and/or use of iterative reconstruction technique. CONTRAST:  22mL OMNIPAQUE IOHEXOL 350 MG/ML SOLN COMPARISON:  Chest radiograph dated 01/03/2022. FINDINGS: Cardiovascular: There is no cardiomegaly or pericardial effusion. There is retrograde flow of contrast from the right atrium into the IVC. The thoracic aorta is unremarkable but suboptimally evaluated due to nonopacification and timing of the contrast. No pulmonary artery embolus identified. Mediastinum/Nodes: There is a 2.6 x 5.0 cm soft tissue mass in the left hilum extending to the mediastinum. This may represent a cluster of enlarged lymph nodes but concerning for malignancy. There is associated mass effect and narrowing of the left upper lobe bronchi. This mass abuts the left mainstem bronchus. There is also loss of fat plane between this mass and a portion of the esophagus (49/4). No mediastinal fluid collection. Lungs/Pleura: Nodular and streaky densities extending from the left hilum into the left upper lobe may represent postobstructive pneumonia. There is impaction of several left upper lobe bronchi. The right lung is clear. Trace left pleural effusion. No pneumothorax. The central airways remain patent. Upper Abdomen: Punctate nonobstructing left renal upper pole calculus.  Musculoskeletal: No acute osseous pathology. Review of the MIP images confirms the above findings. IMPRESSION: 1. No CT evidence of pulmonary artery embolus. 2. Left hilar/mediastinal mass/malignancy and adenopathy. Bronchoscopy may provide better evaluation and assist in tissue sampling. Multidisciplinary consult is advised. 3. Probable left upper  lobe postobstructive pneumonia. Trace left pleural effusion. Electronically Signed   By: Anner Crete M.D.   On: 01/03/2022 01:49   ? ?Microbiology: ?Results for orders placed or performed during the hospi

## 2022-01-04 NOTE — Progress Notes (Signed)
? ? ? ?PULMONOLOGY ? ? ? ? ? ? ? ? ?Date: 01/04/2022,   ?MRN# [id number] Jose Morse 09-Jan-1971 ? ? ?  ?AdmissionWeight: 65.8 kg                 ?CurrentWeight: 65.8 kg ? ? ?Referring physician: Dr Lavella Lemons ? ? ?CHIEF COMPLAINT:  ? ?Post obstructive pneumonia with mediastinal lymphadenopathy ? ? ?HISTORY OF PRESENT ILLNESS  ? ?51 yo M with hx of tobacco smoking, hx of tick borne illness, anxiety disorder, dyslipidemia, hx of cholecystectomy came in with complaints of L sided chest pain to ED noted to have signs of LRTI and diagnosed with pneumonia 1 wk prior to admission s/p treatment with zithromax and prednisone as well as albuterol.  He came in this time with continued CP not improved post CAP treatmetn.  CT chest was done and I reviewed this independently with Left lung infiltrate and possible post obstructive component.  Additional details as below:  There is a 2.6 x 5.0 cm soft tissue mass in the  left hilum extending to the mediastinum. This may represent a cluster of enlarged lymph nodes but concerning for malignancy. There  is associated mass effect and narrowing of the left upper lobe ?bronchi. This mass abuts the left mainstem bronchus. There is also ?loss of fat plane between this mass and a portion of the esophagus ? ?Patient is stable on room air.  He is able to speak in full sentences.  We reviewed imaging together and medical plan. I met with family separately and reviewed plan with daughter and wife.  ? ?01/04/22- patient optimized for dc home. Sent patient profile for evaluation in office to review bronchoscopic biopsy for possible neoplastic lesion.  ? ?PAST MEDICAL HISTORY  ? ?History reviewed. No pertinent past medical history. ? ? ?SURGICAL HISTORY  ? ?Past Surgical History:  ?Procedure Laterality Date  ? CHOLECYSTECTOMY    ? ? ? ?FAMILY HISTORY  ? ?Family History  ?Problem Relation Age of Onset  ? Ovarian cancer Mother   ? Colon cancer Father   ? Diabetes Father   ? ? ? ?SOCIAL HISTORY   ? ?Social History  ? ?Tobacco Use  ? Smoking status: Every Day  ?  Packs/day: 1.00  ?  Years: 34.00  ?  Pack years: 34.00  ?  Types: Cigarettes  ? Smokeless tobacco: Never  ?Substance Use Topics  ? Alcohol use: No  ? ? ? ?MEDICATIONS  ? ? ?Home Medication:  ? ?  ?Current Medication: ? ?Current Facility-Administered Medications:  ?  acetaminophen (TYLENOL) tablet 650 mg, 650 mg, Oral, Q6H PRN **OR** acetaminophen (TYLENOL) suppository 650 mg, 650 mg, Rectal, Q6H PRN, Opyd, Ilene Qua, MD ?  albuterol (PROVENTIL) (2.5 MG/3ML) 0.083% nebulizer solution 2.5 mg, 2.5 mg, Nebulization, Q4H PRN, Opyd, Ilene Qua, MD ?  azithromycin (ZITHROMAX) tablet 500 mg, 500 mg, Oral, Daily, Opyd, Ilene Qua, MD, 500 mg at 01/04/22 0259 ?  cefTRIAXone (ROCEPHIN) 2 g in sodium chloride 0.9 % 100 mL IVPB, 2 g, Intravenous, Q24H, Opyd, Ilene Qua, MD, Last Rate: 200 mL/hr at 01/04/22 0219, 2 g at 01/04/22 0219 ?  dexamethasone (DECADRON) injection 4 mg, 4 mg, Intravenous, Q12H, Ottie Glazier, MD, 4 mg at 01/03/22 1655 ?  enoxaparin (LOVENOX) injection 40 mg, 40 mg, Subcutaneous, Q24H, Darnelle Bos, RPH, 40 mg at 01/03/22 2200 ?  fentaNYL (SUBLIMAZE) injection 50 mcg, 50 mcg, Intravenous, Q2H PRN, Opyd, Ilene Qua, MD ?  ondansetron (ZOFRAN) tablet 4  mg, 4 mg, Oral, Q6H PRN **OR** ondansetron (ZOFRAN) injection 4 mg, 4 mg, Intravenous, Q6H PRN, Opyd, Timothy S, MD ?  oxyCODONE (Oxy IR/ROXICODONE) immediate release tablet 5 mg, 5 mg, Oral, Q4H PRN, Opyd, Ilene Qua, MD ?  senna-docusate (Senokot-S) tablet 1 tablet, 1 tablet, Oral, QHS PRN, Opyd, Ilene Qua, MD ? ? ? ?ALLERGIES  ? ?Codeine ? ? ? ? ?REVIEW OF SYSTEMS  ? ? ?Review of Systems: ? ?Gen:  Denies  fever, sweats, chills weigh loss  ?HEENT: Denies blurred vision, double vision, ear pain, eye pain, hearing loss, nose bleeds, sore throat ?Cardiac:  No dizziness, chest pain or heaviness, chest tightness,edema ?Resp:   reports mild left sided chest discomfort ?Gi: Denies swallowing  difficulty, stomach pain, nausea or vomiting, diarrhea, constipation, bowel incontinence ?Gu:  Denies bladder incontinence, burning urine ?Ext:   Denies Joint pain, stiffness or swelling ?Skin: Denies  skin rash, easy bruising or bleeding or hives ?Endoc:  Denies polyuria, polydipsia , polyphagia or weight change ?Psych:   Denies depression, insomnia or hallucinations  ? ?Other:  All other systems negative ? ? ?VS: BP 124/80 (BP Location: Right Arm)   Pulse (!) 57   Temp (!) 97.5 ?F (36.4 ?C) (Oral)   Resp 16   Ht _0  (1.702 m)   Wt 65.8 kg   SpO2 97%   BMI 22.72 kg/m?   ? ? ? ?PHYSICAL EXAM  ? ? ?GENERAL:NAD, no fevers, chills, no weakness no fatigue ?HEAD: Normocephalic, atraumatic.  ?EYES: Pupils equal, round, reactive to light. Extraocular muscles intact. No scleral icterus.  ?MOUTH: Moist mucosal membrane. Dentition intact. No abscess noted.  ?EAR, NOSE, THROAT: Clear without exudates. No external lesions.  ?NECK: Supple. No thyromegaly. No nodules. No JVD.  ?PULMONARY: CTAB ?CARDIOVASCULAR: S1 and S2. Regular rate and rhythm. No murmurs, rubs, or gallops. No edema. Pedal pulses 2+ bilaterally.  ?GASTROINTESTINAL: Soft, nontender, nondistended. No masses. Positive bowel sounds. No hepatosplenomegaly.  ?MUSCULOSKELETAL: No swelling, clubbing, or edema. Range of motion full in all extremities.  ?NEUROLOGIC: Cranial nerves II through XII are intact. No gross focal neurological deficits. Sensation intact. Reflexes intact.  ?SKIN: No ulceration, lesions, rashes, or cyanosis. Skin warm and dry. Turgor intact.  ?PSYCHIATRIC: Mood, affect within normal limits. The patient is awake, alert and oriented x 3. Insight, judgment intact.  ? ? ?  ? ?IMAGING  ? ? ?DG Chest 2 View ? ?Result Date: 01/03/2022 ?CLINICAL DATA:  Chest pain. EXAM: CHEST - 2 VIEW COMPARISON:  Chest radiograph dated 12/28/2021. FINDINGS: No focal consolidation, pleural effusion, pneumothorax. The cardiac silhouette is within normal limits. No  acute osseous pathology. IMPRESSION: No active cardiopulmonary disease. Electronically Signed   By: Anner Crete M.D.   On: 01/03/2022 00:21  ? ?DG Chest 2 View ? ?Result Date: 12/28/2021 ?CLINICAL DATA:  Chest pain. EXAM: CHEST - 2 VIEW COMPARISON:  December 29, 2016. FINDINGS: The heart size and mediastinal contours are within normal limits. Right lung is clear. Small ill-defined opacity seen peripherally in the left upper lobe. The visualized skeletal structures are unremarkable. IMPRESSION: Small ill-defined opacity seen peripherally in left upper lobe concerning for possible pneumonia. Followup PA and lateral chest X-ray is recommended in 3-4 weeks following trial of antibiotic therapy to ensure resolution and exclude underlying malignancy. Electronically Signed   By: Marijo Conception M.D.   On: 12/28/2021 09:26  ? ?CT Angio Chest PE W and/or Wo Contrast ? ?Result Date: 01/03/2022 ?CLINICAL DATA:  Chest pain.  Concern for pulmonary embolism. EXAM: CT ANGIOGRAPHY CHEST WITH CONTRAST TECHNIQUE: Multidetector CT imaging of the chest was performed using the standard protocol during bolus administration of intravenous contrast. Multiplanar CT image reconstructions and MIPs were obtained to evaluate the vascular anatomy. RADIATION DOSE REDUCTION: This exam was performed according to the departmental dose-optimization program which includes automated exposure control, adjustment of the mA and/or kV according to patient size and/or use of iterative reconstruction technique. CONTRAST:  59m OMNIPAQUE IOHEXOL 350 MG/ML SOLN COMPARISON:  Chest radiograph dated 01/03/2022. FINDINGS: Cardiovascular: There is no cardiomegaly or pericardial effusion. There is retrograde flow of contrast from the right atrium into the IVC. The thoracic aorta is unremarkable but suboptimally evaluated due to nonopacification and timing of the contrast. No pulmonary artery embolus identified. Mediastinum/Nodes: There is a 2.6 x 5.0 cm soft tissue  mass in the left hilum extending to the mediastinum. This may represent a cluster of enlarged lymph nodes but concerning for malignancy. There is associated mass effect and narrowing of the left upper lobe bronchi. This mass

## 2022-01-05 LAB — FUNGITELL, SERUM: Fungitell Result: <31 pg/mL (ref <80)

## 2022-01-18 ENCOUNTER — Other Ambulatory Visit (HOSPITAL_COMMUNITY): Payer: Self-pay | Admitting: Pulmonary Disease

## 2022-01-18 ENCOUNTER — Other Ambulatory Visit: Payer: Self-pay | Admitting: Pulmonary Disease

## 2022-01-18 DIAGNOSIS — R918 Other nonspecific abnormal finding of lung field: Secondary | ICD-10-CM

## 2022-02-06 ENCOUNTER — Encounter (HOSPITAL_COMMUNITY)
Admission: RE | Admit: 2022-02-06 | Discharge: 2022-02-06 | Disposition: A | Discharge status: Home / Self Care | Payer: BC Managed Care – PPO | Source: Ambulatory Visit | Attending: Pulmonary Disease | Admitting: Pulmonary Disease

## 2022-02-06 DIAGNOSIS — R918 Other nonspecific abnormal finding of lung field: Secondary | ICD-10-CM | POA: Diagnosis present

## 2022-02-06 LAB — GLUCOSE, CAPILLARY: Glucose-Capillary: 96 mg/dL (ref 70–99)

## 2022-02-06 MED ORDER — FLUDEOXYGLUCOSE F - 18 (FDG) INJECTION
7.2100 | Freq: Once | INTRAVENOUS | Status: AC
  Administered 2022-02-06: 7.2 via INTRAVENOUS

## 2022-02-08 ENCOUNTER — Other Ambulatory Visit: Payer: Self-pay | Admitting: Pulmonary Disease

## 2022-02-08 DIAGNOSIS — R918 Other nonspecific abnormal finding of lung field: Secondary | ICD-10-CM

## 2022-02-09 ENCOUNTER — Ambulatory Visit
Admission: RE | Admit: 2022-02-09 | Discharge: 2022-02-09 | Disposition: A | Discharge status: Home / Self Care | Payer: BC Managed Care – PPO | Source: Ambulatory Visit | Attending: Pulmonary Disease | Admitting: Pulmonary Disease

## 2022-02-09 ENCOUNTER — Other Ambulatory Visit: Payer: Self-pay

## 2022-02-09 ENCOUNTER — Other Ambulatory Visit: Admission: RE | Admit: 2022-02-09 | Payer: BC Managed Care – PPO | Source: Ambulatory Visit

## 2022-02-09 ENCOUNTER — Encounter
Admission: RE | Admit: 2022-02-09 | Discharge: 2022-02-09 | Disposition: A | Discharge status: Home / Self Care | Payer: BC Managed Care – PPO | Source: Ambulatory Visit

## 2022-02-09 ENCOUNTER — Encounter
Admission: RE | Admit: 2022-02-09 | Discharge: 2022-02-09 | Disposition: A | Discharge status: Home / Self Care | Payer: BC Managed Care – PPO | Source: Ambulatory Visit | Attending: Pulmonary Disease | Admitting: Pulmonary Disease

## 2022-02-09 VITALS — BP 159/92 | HR 44 | Resp 16 | Ht 67.0 in | Wt 146.0 lb

## 2022-02-09 DIAGNOSIS — Z01818 Encounter for other preprocedural examination: Secondary | ICD-10-CM | POA: Insufficient documentation

## 2022-02-09 DIAGNOSIS — R918 Other nonspecific abnormal finding of lung field: Secondary | ICD-10-CM

## 2022-02-09 DIAGNOSIS — Z20822 Contact with and (suspected) exposure to covid-19: Secondary | ICD-10-CM | POA: Insufficient documentation

## 2022-02-09 DIAGNOSIS — F17210 Nicotine dependence, cigarettes, uncomplicated: Secondary | ICD-10-CM | POA: Diagnosis not present

## 2022-02-09 DIAGNOSIS — Z720 Tobacco use: Secondary | ICD-10-CM

## 2022-02-09 DIAGNOSIS — C3412 Malignant neoplasm of upper lobe, left bronchus or lung: Secondary | ICD-10-CM | POA: Diagnosis present

## 2022-02-09 DIAGNOSIS — Z1152 Encounter for screening for COVID-19: Secondary | ICD-10-CM

## 2022-02-09 DIAGNOSIS — Z79899 Other long term (current) drug therapy: Secondary | ICD-10-CM | POA: Diagnosis not present

## 2022-02-09 HISTORY — DX: Personal history of urinary calculi: Z87.442

## 2022-02-09 LAB — CBC
HCT: 44.8 % (ref 39.0–52.0)
Hemoglobin: 14.6 g/dL (ref 13.0–17.0)
MCH: 29.3 pg (ref 26.0–34.0)
MCHC: 32.6 g/dL (ref 30.0–36.0)
MCV: 90 fL (ref 80.0–100.0)
Platelets: 248 10*3/uL (ref 150–400)
RBC: 4.98 MIL/uL (ref 4.22–5.81)
RDW: 13.6 % (ref 11.5–15.5)
WBC: 9.4 10*3/uL (ref 4.0–10.5)
nRBC: 0 % (ref 0.0–0.2)

## 2022-02-09 LAB — APTT: aPTT: 33 seconds (ref 24–36)

## 2022-02-09 LAB — PROTIME-INR
INR: 1 (ref 0.8–1.2)
Prothrombin Time: 12.7 seconds (ref 11.4–15.2)

## 2022-02-09 NOTE — Patient Instructions (Addendum)
Your procedure is scheduled on: 02/10/22 Report to [place]. To find out your arrival time please call [phone] between 1PM - 3PM on 02/09/22 .  Remember: Instructions that are not followed completely may result in serious medical risk, up to and including death, or upon the discretion of your surgeon and anesthesiologist your surgery may need to be rescheduled.     _X__ 1. Do not eat food or drink liquids after midnight the night before your procedure.                 No gum chewing or hard candies.   __X__2.  On the morning of surgery brush your teeth with toothpaste and water, you                 may rinse your mouth with mouthwash if you wish.  Do not swallow any              toothpaste of mouthwash.     _X__ 3.  No Alcohol for 24 hours before or after surgery.   _X__ 4.  Do Not Smoke or use e-cigarettes For 24 Hours Prior to Your Surgery.                 Do not use any chewable tobacco products for at least 6 hours prior to                 surgery.  ____  5.  Bring all medications with you on the day of surgery if instructed.   __X__  6.  Notify your doctor if there is any change in your medical condition      (cold, fever, infections).     Do not wear jewelry, make-up, hairpins, clips or nail polish. Do not wear lotions, powders, or perfumes. You may wear deodorant Do not shave body hair 48 hours prior to surgery. Men may shave face and neck. Do not bring valuables to the hospital.    [facility] is not responsible for any belongings or valuables.  Contacts, dentures/partials or body piercings may not be worn into surgery. Bring a case for your contacts, glasses or hearing aids, a denture cup will be supplied. Leave your suitcase in the car. After surgery it may be brought to your room. For patients admitted to the hospital, discharge time is determined by your treatment team.   Patients discharged the day of surgery  will not be allowed to drive home.   Please read over the following fact sheets that you were given:   MRSA Information  __X__ Take these medicines the morning of surgery with A SIP OF WATER:    1. none  2.   3.   4.  5.  6.  ____ Fleet Enema (as directed)   ____ Use CHG Soap/SAGE wipes as directed  __X__ Use inhalers on the day of surgery  ____ Stop metformin/Janumet/Farxiga 2 days prior to surgery    ____ Take 1/2 of usual insulin dose the night before surgery. No insulin the morning          of surgery.   ____ Stop Blood Thinners Coumadin/Plavix/Xarelto/Pleta/Pradaxa/Eliquis/Effient/Aspirin  on   Or contact your Surgeon, Cardiologist or Medical Doctor regarding  ability to stop your blood thinners  __X__ Stop Anti-inflammatories 7 days before surgery such as Advil, Ibuprofen, Motrin,  BC or Goodies Powder, Naprosyn, Naproxen, Aleve, Aspirin    __X__ Stop all herbal supplements,  fish oil or vitamin E until after surgery.    ____ Bring C-Pap to the hospital.   Tylenol only today if needed

## 2022-02-10 ENCOUNTER — Ambulatory Visit
Admission: RE | Admit: 2022-02-10 | Discharge: 2022-02-10 | Disposition: A | Discharge status: Home / Self Care | Payer: BC Managed Care – PPO | Source: Ambulatory Visit | Attending: Pulmonary Disease | Admitting: Pulmonary Disease

## 2022-02-10 ENCOUNTER — Encounter
Admission: RE | Disposition: A | Discharge status: Home / Self Care | Payer: Self-pay | Source: Ambulatory Visit | Attending: Pulmonary Disease

## 2022-02-10 ENCOUNTER — Ambulatory Visit: Payer: BC Managed Care – PPO

## 2022-02-10 ENCOUNTER — Ambulatory Visit: Payer: BC Managed Care – PPO | Admitting: Certified Registered Nurse Anesthetist

## 2022-02-10 DIAGNOSIS — Z79899 Other long term (current) drug therapy: Secondary | ICD-10-CM | POA: Insufficient documentation

## 2022-02-10 DIAGNOSIS — Z20822 Contact with and (suspected) exposure to covid-19: Secondary | ICD-10-CM | POA: Insufficient documentation

## 2022-02-10 DIAGNOSIS — F17210 Nicotine dependence, cigarettes, uncomplicated: Secondary | ICD-10-CM | POA: Insufficient documentation

## 2022-02-10 DIAGNOSIS — C3412 Malignant neoplasm of upper lobe, left bronchus or lung: Secondary | ICD-10-CM | POA: Diagnosis not present

## 2022-02-10 HISTORY — PX: VIDEO BRONCHOSCOPY WITH ENDOBRONCHIAL ULTRASOUND: SHX6177

## 2022-02-10 HISTORY — PX: VIDEO BRONCHOSCOPY WITH ENDOBRONCHIAL NAVIGATION: SHX6175

## 2022-02-10 LAB — SARS CORONAVIRUS 2 (TAT 6-24 HRS): SARS Coronavirus 2: NEGATIVE

## 2022-02-10 SURGERY — BRONCHOSCOPY, WITH EBUS
Anesthesia: General

## 2022-02-10 MED ORDER — FENTANYL CITRATE (PF) 100 MCG/2ML IJ SOLN
INTRAMUSCULAR | Status: AC
  Filled 2022-02-10: qty 2

## 2022-02-10 MED ORDER — PROPOFOL 10 MG/ML IV BOLUS
INTRAVENOUS | Status: AC
  Filled 2022-02-10: qty 20

## 2022-02-10 MED ORDER — LIDOCAINE HCL (CARDIAC) PF 100 MG/5ML IV SOSY
PREFILLED_SYRINGE | INTRAVENOUS | Status: DC | PRN
Start: 2022-02-10 — End: 2022-02-10
  Administered 2022-02-10: 80 mg via INTRAVENOUS

## 2022-02-10 MED ORDER — PHENYLEPHRINE HCL 0.25 % NA SOLN
1.0000 | Freq: Four times a day (QID) | NASAL | Status: DC | PRN
  Filled 2022-02-10: qty 15

## 2022-02-10 MED ORDER — CHLORHEXIDINE GLUCONATE 0.12 % MT SOLN
OROMUCOSAL | Status: AC
  Filled 2022-02-10: qty 15

## 2022-02-10 MED ORDER — DEXAMETHASONE SODIUM PHOSPHATE 10 MG/ML IJ SOLN
INTRAMUSCULAR | Status: DC | PRN
  Administered 2022-02-10: 10 mg via INTRAVENOUS

## 2022-02-10 MED ORDER — SUCCINYLCHOLINE CHLORIDE 200 MG/10ML IV SOSY
PREFILLED_SYRINGE | INTRAVENOUS | Status: DC | PRN
Start: 2022-02-10 — End: 2022-02-10
  Administered 2022-02-10: 100 mg via INTRAVENOUS

## 2022-02-10 MED ORDER — ROCURONIUM BROMIDE 100 MG/10ML IV SOLN
INTRAVENOUS | Status: DC | PRN
  Administered 2022-02-10: 10 mg via INTRAVENOUS
  Administered 2022-02-10: 30 mg via INTRAVENOUS
  Administered 2022-02-10: 20 mg via INTRAVENOUS

## 2022-02-10 MED ORDER — LIDOCAINE HCL (PF) 2 % IJ SOLN
INTRAMUSCULAR | Status: AC
  Filled 2022-02-10: qty 5

## 2022-02-10 MED ORDER — SEVOFLURANE IN SOLN
RESPIRATORY_TRACT | Status: AC
  Filled 2022-02-10: qty 250

## 2022-02-10 MED ORDER — SUCCINYLCHOLINE CHLORIDE 200 MG/10ML IV SOSY
PREFILLED_SYRINGE | INTRAVENOUS | Status: AC
  Filled 2022-02-10: qty 10

## 2022-02-10 MED ORDER — MIDAZOLAM HCL 2 MG/2ML IJ SOLN
INTRAMUSCULAR | Status: AC
  Filled 2022-02-10: qty 2

## 2022-02-10 MED ORDER — PHENYLEPHRINE HCL (PRESSORS) 10 MG/ML IV SOLN
INTRAVENOUS | Status: DC | PRN
  Administered 2022-02-10 (×2): 80 ug via INTRAVENOUS
  Administered 2022-02-10: 160 ug via INTRAVENOUS
  Administered 2022-02-10 (×3): 80 ug via INTRAVENOUS
  Administered 2022-02-10: 40 ug via INTRAVENOUS
  Administered 2022-02-10: 80 ug via INTRAVENOUS

## 2022-02-10 MED ORDER — ONDANSETRON HCL 4 MG/2ML IJ SOLN
INTRAMUSCULAR | Status: AC
  Filled 2022-02-10: qty 2

## 2022-02-10 MED ORDER — ONDANSETRON HCL 4 MG/2ML IJ SOLN
INTRAMUSCULAR | Status: DC | PRN
  Administered 2022-02-10: 4 mg via INTRAVENOUS

## 2022-02-10 MED ORDER — BUTAMBEN-TETRACAINE-BENZOCAINE 2-2-14 % EX AERO
1.0000 | INHALATION_SPRAY | Freq: Once | CUTANEOUS | Status: DC
  Filled 2022-02-10: qty 20

## 2022-02-10 MED ORDER — SUGAMMADEX SODIUM 200 MG/2ML IV SOLN
INTRAVENOUS | Status: DC | PRN
  Administered 2022-02-10: 132.4 mg via INTRAVENOUS

## 2022-02-10 MED ORDER — FENTANYL CITRATE (PF) 100 MCG/2ML IJ SOLN
INTRAMUSCULAR | Status: DC | PRN
  Administered 2022-02-10 (×2): 50 ug via INTRAVENOUS

## 2022-02-10 MED ORDER — PROPOFOL 10 MG/ML IV BOLUS
INTRAVENOUS | Status: DC | PRN
  Administered 2022-02-10: 120 mg via INTRAVENOUS

## 2022-02-10 MED ORDER — PHENYLEPHRINE 80 MCG/ML (10ML) SYRINGE FOR IV PUSH (FOR BLOOD PRESSURE SUPPORT)
PREFILLED_SYRINGE | INTRAVENOUS | Status: AC
  Filled 2022-02-10: qty 10

## 2022-02-10 MED ORDER — ROCURONIUM BROMIDE 10 MG/ML (PF) SYRINGE
PREFILLED_SYRINGE | INTRAVENOUS | Status: AC
  Filled 2022-02-10: qty 10

## 2022-02-10 MED ORDER — EPHEDRINE SULFATE (PRESSORS) 50 MG/ML IJ SOLN
INTRAMUSCULAR | Status: DC | PRN
  Administered 2022-02-10 (×2): 5 mg via INTRAVENOUS

## 2022-02-10 MED ORDER — LACTATED RINGERS IV SOLN: INTRAVENOUS | Status: DC

## 2022-02-10 MED ORDER — FENTANYL CITRATE (PF) 100 MCG/2ML IJ SOLN: 25.0000 ug | INTRAMUSCULAR | Status: DC | PRN

## 2022-02-10 MED ORDER — DEXAMETHASONE SODIUM PHOSPHATE 10 MG/ML IJ SOLN
INTRAMUSCULAR | Status: AC
Start: 2022-02-10 — End: ?
  Filled 2022-02-10: qty 1

## 2022-02-10 MED ORDER — EPHEDRINE 5 MG/ML INJ
INTRAVENOUS | Status: AC
  Filled 2022-02-10: qty 5

## 2022-02-10 MED ORDER — MIDAZOLAM HCL 2 MG/2ML IJ SOLN
INTRAMUSCULAR | Status: DC | PRN
  Administered 2022-02-10: 2 mg via INTRAVENOUS

## 2022-02-10 MED ORDER — ORAL CARE MOUTH RINSE: 15.0000 mL | Freq: Once | OROMUCOSAL | Status: DC

## 2022-02-10 MED ORDER — ONDANSETRON HCL 4 MG/2ML IJ SOLN: 4.0000 mg | Freq: Once | INTRAMUSCULAR | Status: DC | PRN

## 2022-02-10 MED ORDER — LIDOCAINE HCL (PF) 1 % IJ SOLN
30.0000 mL | Freq: Once | INTRAMUSCULAR | Status: DC
  Filled 2022-02-10: qty 30

## 2022-02-10 MED ORDER — ALBUTEROL SULFATE HFA 108 (90 BASE) MCG/ACT IN AERS
INHALATION_SPRAY | RESPIRATORY_TRACT | Status: DC | PRN
  Administered 2022-02-10: 2 via RESPIRATORY_TRACT

## 2022-02-10 MED ORDER — LIDOCAINE HCL URETHRAL/MUCOSAL 2 % EX GEL
1.0000 "application " | Freq: Once | CUTANEOUS | Status: DC
  Filled 2022-02-10: qty 5

## 2022-02-10 MED ORDER — CHLORHEXIDINE GLUCONATE 0.12 % MT SOLN: 15.0000 mL | Freq: Once | OROMUCOSAL | Status: DC

## 2022-02-10 NOTE — Transfer of Care (Signed)
Immediate Anesthesia Transfer of Care Note ? ?Patient: Jose Morse ? ?Procedure(s) Performed: VIDEO BRONCHOSCOPY WITH ENDOBRONCHIAL ULTRASOUND ?VIDEO BRONCHOSCOPY WITH ENDOBRONCHIAL NAVIGATION ? ?Patient Location: PACU ? ?Anesthesia Type:General ? ?Level of Consciousness: awake and alert  ? ?Airway & Oxygen Therapy: Patient Spontanous Breathing and Patient connected to face mask oxygen ? ?Post-op Assessment: Report given to RN and Post -op Vital signs reviewed and stable ? ?Post vital signs: Reviewed and stable ? ?Last Vitals:  ?Vitals Value Taken Time  ?BP 112/69 02/10/22 1419  ?Temp 36.4 ?C 02/10/22 1419  ?Pulse 84 02/10/22 1423  ?Resp 26 02/10/22 1423  ?SpO2 98 % 02/10/22 1423  ?Vitals shown include unvalidated device data. ? ?Last Pain:  ?Vitals:  ? 02/10/22 1123  ?TempSrc: Temporal  ?PainSc: 4   ?   ? ?  ? ?Complications: No notable events documented. ?

## 2022-02-10 NOTE — Discharge Instructions (Addendum)
AMBULATORY SURGERY  ?DISCHARGE INSTRUCTIONS ? ? ?The drugs that you were given will stay in your system until tomorrow so for the next 24 hours you should not: ? ?Drive an automobile ?Make any legal decisions ?Drink any alcoholic beverage ? ?LIQUIDS AND SOFT FOODS TODAY PER BRONCHOSCOPY INSTRUCTION. ? ? ?Please notify your doctor immediately if you have any unusual bleeding, trouble breathing, redness and pain at the surgery site, drainage, fever, or pain not relieved by medication. ?  ? ? ?Additional Instructions:  ?

## 2022-02-10 NOTE — Anesthesia Procedure Notes (Signed)
Procedure Name: Intubation ?Date/Time: 02/10/2022 12:41 PM ?Performed by: Demetrius Charity, CRNA ?Pre-anesthesia Checklist: Patient identified, Patient being monitored, Timeout performed, Emergency Drugs available and Suction available ?Patient Re-evaluated:Patient Re-evaluated prior to induction ?Oxygen Delivery Method: Circle system utilized ?Preoxygenation: Pre-oxygenation with 100% oxygen ?Induction Type: IV induction ?Ventilation: Mask ventilation without difficulty ?Laryngoscope Size: McGraph and 4 ?Grade View: Grade I ?Tube type: Oral ?Tube size: 9.0 mm ?Number of attempts: 1 ?Airway Equipment and Method: Stylet ?Placement Confirmation: ETT inserted through vocal cords under direct vision, positive ETCO2 and breath sounds checked- equal and bilateral ?Secured at: 22 cm ?Tube secured with: Tape ?Dental Injury: Teeth and Oropharynx as per pre-operative assessment  ? ? ? ? ?

## 2022-02-10 NOTE — Anesthesia Preprocedure Evaluation (Addendum)
Anesthesia Evaluation  ?Patient identified by MRN, date of birth, ID band ?Patient awake ? ? ? ?Reviewed: ?Allergy & Precautions, H&P , NPO status , Patient's Chart, lab work & pertinent test results, reviewed documented beta blocker date and time  ? ?Airway ?Mallampati: II ? ?TM Distance: >3 FB ?Neck ROM: full ? ? ? Dental ? ?(+) Dental Advidsory Given, Edentulous Upper, Edentulous Lower ?  ?Pulmonary ?neg shortness of breath, pneumonia, resolved, COPD,  COPD inhaler, neg recent URI, Current Smoker,  ?  ?Pulmonary exam normal ?breath sounds clear to auscultation ? ? ? ? ? ? Cardiovascular ?Exercise Tolerance: Good ?negative cardio ROS ?Normal cardiovascular exam ?Rhythm:regular Rate:Normal ? ? ?  ?Neuro/Psych ?negative neurological ROS ? negative psych ROS  ? GI/Hepatic ?negative GI ROS, Neg liver ROS,   ?Endo/Other  ?negative endocrine ROS ? Renal/GU ?negative Renal ROS  ?negative genitourinary ?  ?Musculoskeletal ? ? Abdominal ?  ?Peds ? Hematology ?negative hematology ROS ?(+)   ?Anesthesia Other Findings ?Past Medical History: ?No date: History of kidney stones ? ? Reproductive/Obstetrics ?negative OB ROS ? ?  ? ? ? ? ? ? ? ? ? ? ? ? ? ?  ?  ? ? ? ? ? ? ? ? ?Anesthesia Physical ?Anesthesia Plan ? ?ASA: 2 ? ?Anesthesia Plan: General  ? ?Post-op Pain Management:   ? ?Induction: Intravenous ? ?PONV Risk Score and Plan: 1 and Ondansetron, Dexamethasone, Midazolam and Treatment may vary due to age or medical condition ? ?Airway Management Planned: Oral ETT ? ?Additional Equipment:  ? ?Intra-op Plan:  ? ?Post-operative Plan: Extubation in OR ? ?Informed Consent: I have reviewed the patients History and Physical, chart, labs and discussed the procedure including the risks, benefits and alternatives for the proposed anesthesia with the patient or authorized representative who has indicated his/her understanding and acceptance.  ? ? ? ?Dental Advisory Given ? ?Plan Discussed with:  Anesthesiologist, CRNA and Surgeon ? ?Anesthesia Plan Comments:   ? ? ? ? ? ? ?Anesthesia Quick Evaluation ? ?

## 2022-02-10 NOTE — H&P (Signed)
? ? ? ?PULMONOLOGY ? ? ? ? ? ? ? ? ?Date: 02/10/2022,   ?MRN# [id number] BROCK LARMON 1971-01-12 ? ? ? ?CHIEF COMPLAINT:  ? ?Left lung mass with hilar adenopathy ? ? ?HISTORY OF PRESENT ILLNESS  ? ?This is a 51 yo pt with hx of smoking who was seen for pneumonia.  He was treated and feels better but not to baseline. He had ongoing chronic back pain x 10 years and shared this has gotten worse.  We had PET scan with lung mass showing hypemetabolism as well as numerous other pet avid lesions suggestive of metastatic implants. He is here today for tissue diagnosis via bronchoscopy.  ? ?Reviewed risks/complications and benefits with patient, risks include infection, pneumothorax/pneumomediastinum which may require chest tube placement as well as overnight/prolonged hospitalization and possible mechanical ventilation. Other risks include bleeding and very rarely death.  Patient understands risks and wishes to proceed.  Additional questions were answered, and patient is aware that post procedure patient will be going home with family and may experience cough with possible clots on expectoration as well as phlegm which may last few days as well as hoarseness of voice post intubation and mechanical ventilation. ? ? ? ?PAST MEDICAL HISTORY  ? ?Past Medical History:  ?Diagnosis Date  ? History of kidney stones   ? ? ? ?SURGICAL HISTORY  ? ?Past Surgical History:  ?Procedure Laterality Date  ? CHOLECYSTECTOMY    ? ? ? ?FAMILY HISTORY  ? ?Family History  ?Problem Relation Age of Onset  ? Ovarian cancer Mother   ? Colon cancer Father   ? Diabetes Father   ? ? ? ?SOCIAL HISTORY  ? ?Social History  ? ?Tobacco Use  ? Smoking status: Every Day  ?  Packs/day: 0.50  ?  Years: 34.00  ?  Pack years: 17.00  ?  Types: Cigarettes  ? Smokeless tobacco: Never  ?Vaping Use  ? Vaping Use: Never used  ?Substance Use Topics  ? Alcohol use: No  ? Drug use: Not Currently  ?  Types: Marijuana  ? ? ? ?MEDICATIONS  ? ? ?Home Medication:  ?  ?Current  Medication: ? ?Current Facility-Administered Medications:  ?  butamben-tetracaine-benzocaine (CETACAINE) spray 1 spray, 1 spray, Topical, Once, Drema Eddington, MD ?  chlorhexidine (PERIDEX) 0.12 % solution 15 mL, 15 mL, Mouth/Throat, Once **OR** MEDLINE mouth rinse, 15 mL, Mouth Rinse, Once, Piscitello, Precious Haws, MD ?  lactated ringers infusion, , Intravenous, Continuous, Piscitello, Precious Haws, MD ?  lidocaine (PF) (XYLOCAINE) 1 % injection 30 mL, 30 mL, Infiltration, Once, Rahmel Nedved, MD ?  lidocaine (XYLOCAINE) 2 % jelly 1 application., 1 application., Topical, Once, Ottie Glazier, MD ?  phenylephrine (NEO-SYNEPHRINE) 0.25 % nasal spray 1 spray, 1 spray, Each Nare, Q6H PRN, Ottie Glazier, MD ? ? ? ?ALLERGIES  ? ?Codeine ? ? ? ? ?REVIEW OF SYSTEMS  ? ? ?Review of Systems: ? ?Gen:  Denies  fever, sweats, chills weigh loss  ?HEENT: Denies blurred vision, double vision, ear pain, eye pain, hearing loss, nose bleeds, sore throat ?Cardiac:  No dizziness, chest pain or heaviness, chest tightness,edema ?Resp:   reports dyspnea chronically  ?Gi: Denies swallowing difficulty, stomach pain, nausea or vomiting, diarrhea, constipation, bowel incontinence ?Gu:  Denies bladder incontinence, burning urine ?Ext:   Denies Joint pain, stiffness or swelling ?Skin: Denies  skin rash, easy bruising or bleeding or hives ?Endoc:  Denies polyuria, polydipsia , polyphagia or weight change ?Psych:   Denies depression, insomnia  or hallucinations  ? ?Other:  All other systems negative ? ? ?VS: BP 135/82   Pulse 74   Temp (!) 97.3 ?F (36.3 ?C) (Temporal)   Resp 16   SpO2 98%   ? ? ? ?PHYSICAL EXAM  ? ? ?GENERAL:NAD, no fevers, chills, no weakness no fatigue ?HEAD: Normocephalic, atraumatic.  ?EYES: Pupils equal, round, reactive to light. Extraocular muscles intact. No scleral icterus.  ?MOUTH: Moist mucosal membrane. Dentition intact. No abscess noted.  ?EAR, NOSE, THROAT: Clear without exudates. No external lesions.  ?NECK:  Supple. No thyromegaly. No nodules. No JVD.  ?PULMONARY: decreased breath sounds with mild rhonchi worse at bases bilaterally.  ?CARDIOVASCULAR: S1 and S2. Regular rate and rhythm. No murmurs, rubs, or gallops. No edema. Pedal pulses 2+ bilaterally.  ?GASTROINTESTINAL: Soft, nontender, nondistended. No masses. Positive bowel sounds. No hepatosplenomegaly.  ?MUSCULOSKELETAL: No swelling, clubbing, or edema. Range of motion full in all extremities.  ?NEUROLOGIC: Cranial nerves II through XII are intact. No gross focal neurological deficits. Sensation intact. Reflexes intact.  ?SKIN: No ulceration, lesions, rashes, or cyanosis. Skin warm and dry. Turgor intact.  ?PSYCHIATRIC: Mood, affect within normal limits. The patient is awake, alert and oriented x 3. Insight, judgment intact.  ? ? ?  ? ?IMAGING  ? ?@IMAGES @  ? ?ASSESSMENT/PLAN  ? ?Left lung mass with hilar lymphadenopathy ?- patient is to have navigational bronchoscopy with EBUS ?  -Reviewed risks/complications and benefits with patient, risks include infection, pneumothorax/pneumomediastinum which may require chest tube placement as well as overnight/prolonged hospitalization and possible mechanical ventilation. Other risks include bleeding and very rarely death.  Patient understands risks and wishes to proceed.  Additional questions were answered, and patient is aware that post procedure patient will be going home with family and may experience cough with possible clots on expectoration as well as phlegm which may last few days as well as hoarseness of voice post intubation and mechanical ventilation. ? ? ? ?  ? ? ? ? ? ? ? ?Thank you for allowing me to participate in the care of this patient.  ? ?Patient/Family are satisfied with care plan and all questions have been answered.  ? ? ?Provider disclosure: ?Patient with at least one acute or chronic illness or injury that poses a threat to life or bodily function and is being managed actively during this  encounter.  All of the below services have been performed independently by signing provider:  review of prior documentation from internal and or external health records.  Review of previous and current lab results.  Interview and comprehensive assessment during patient visit today. Review of current and previous chest radiographs/CT scans. Discussion of management and test interpretation with health care team and patient/family.  ? ?This document was prepared using Dragon voice recognition software and may include unintentional dictation errors. ? ? ?  ?Ottie Glazier, M.D.  ?Division of Pulmonary & Critical Care Medicine  ? ? ? ? ? ? ? ? ?

## 2022-02-10 NOTE — Procedures (Addendum)
ELECTROMAGNETIC NAVIGATIONAL BRONCHOSCOPY PROCEDURE NOTE ? ?FIBEROPTIC BRONCHOSCOPY WITH BRONCHOALVEOLAR LAVAGE PROCEDURE AND THERAPEUTIC ASPIRATION OF TRACHEOBRONCHIAL TREE PROCEDURE NOTE ? ?ENDOBRONCHIAL ULTRASOUND PROCEDURE NOTE ? ? ? ?Flexible bronchoscopy was performed  by : Lanney Gins MD ? ?assistance by : 1)Repiratory therapist  and 2)[employer] staff and 3) Anesthesia team and 4) Flouroscopy team and 5) [facility] supporting staff ? ? ?Indication for the procedure was : ? ?Pre-procedural H&P. ?The following assessment was performed on the day of the procedure prior to initiating sedation ?History:  ?Chest pain n ?Dyspnea y ?Hemoptysis n ?Cough y ?Fever n ?Other pertinent items n ? ?Examination ?Vital signs -reviewed as per nursing documentation today ?Cardiac    Murmurs: n ? Rubs : n ? Gallop: n ?Lungs Wheezing: n ?Rales : n ?Rhonchi :y ? ?Other pertinent findings: SOB/hypoxemia due to chronic lung disease  ? ?Pre-procedural assessment for Procedural Sedation included: ?Depth of sedation: As per anesthesia team  ?ASA Classification:  2 ?Mallampati airway assessment: 3 ? ? ? ?Medication list reviewed: y ? ?The patient?s interval history was taken and revealed: no new complaints ?The pre- procedure physical examination revealed: No new findings ?Refer to prior clinic note for details. ? ?Informed Consent: ?Informed consent was obtained from:  patient after explanation of procedure and risks, benefits, as well as alternative procedures available.  Explanation of level of sedation and possible transfusion was also provided.  ?  ?Procedural Preparation: ?Time out was performed and patient was identified by name and birthdate and procedure to be performed and side for sampling, if any, was specified. ?Pt was intubated by anesthesia. ? ?The patient was appropriately draped. ? ? ?Fiberoptic bronchoscopy with airway inspection and BAL Procedure findings: ? ?Bronchoscope was inserted via ETT  without difficulty.   Posterior oropharynx, epiglottis, arytenoids, false cords and vocal cords were not visualized as these were bypassed by endotracheal tube. The distal trachea was normal in circumference and appearance without mucosal, cartilaginous or branching abnormalities.  The main carina was mildly splayed . ?All right and left lobar airways were visualized to the Subsegmental level.  Sub- sub segmental carinae were identified in all the distal airways.   ?Secretions were visible in the following airways and appeared to be clear.  ?The mucosa was : friable at RUL ? ?Airways were notable for:  ?      exophytic lesions :n ?      extrinsic compression in the following distributions: n. ?      Friable mucosa: y ?      Anthrocotic material /pigmentation: n ?  ?Thickened mucus was aspirated from RUL, RLL and LUL and LLL ? ?BRONCHOALVEOLAR LAVAGE PERFORMED AT LUL X2 ? ?Post procedure Diagnosis:   FRIABLE MUCOSA AT BIFURCATION OF LEFT UPPER AND LOWER LOBE  ? ? ? ? ?Electromagnetic Navigational Bronchoscopy Procedure Findings:  ?After appropriate CT-guided planning scope was advanced via endotracheal tube and advanced towards planned target  Post appropriate planning and registration peripheral navigation was used to visualize target lesion.  Body vision platform utilized  ? ?Post procedure diagnosis: LUNG CANCER  ? ? ?SURGICAL BIOPSY X4 PERFORMED AT LUL ? ? ?Endobronchial ultrasound assisted hilar and mediastinal lymph node biopsies procedure findings: ?The fiberoptic bronchoscope was removed and the EBUS scope was introduced. Examination began to evaluate for pathologically enlarged lymph nodes starting on the RIGHT  side progressing to the LEFT side.  All lymph node biopsies performed with 21G needle. Lymph node biopsies were sent in cytolite for all stations. ? ?STATION 10R -  0.5CM - NOT BIOPSIED  ?STATION 7- 2.1 CM - BIOPSIED X4 - +ATYPICAL CELLS  ?STATION 10L - 2.2CM - BIOPSIED X3 - ATYPICAL CELLS NOTED ?STATION 11L/TUMOR  DIFFICULT TO DISCERN - X4 - LESIONAL CELL FOR CANCER  ? ?Post procedure diagnosis:  LUNG CANCER  ? ? ?Specimens obtained included: ? ?Broncho-alveolar lavage site:LUL  sent for CYTOLOGY                            ? ?60 ml volume infused ?30 ml volume returned with CELLULAR  appearance ? ? ?                             ?Transbronchial biopsy site: STATION 11L OR TUMOR COULD NOT TELL APART ; sent for CYTOLOGY - LESIONAL CELLS NOTED    ?   ? ? ?Fluoroscopy Used: YES ;        Pictorial documentation attached: NONE  ?               ?Transbronchial WANG needle aspiration site: LEFT LUNG AROUND 10L AREA SUPERIORLY AT BIFURCATION OF LUL/LLL                                   ? ? ? ?Immediate sampling complications included:NONE  ?Epinephrine NONE ml was used topically ? ?The bronchoscopy was terminated due to completion of the planned procedure and the bronchoscope was removed.  ? ?Total dosage of Lidocaine was NONE mg ? ? ?Estimated Blood loss: 5 cc. ? ?Complications included: ? NONE  ? ?Preliminary CXR findings :  ?NONE IMMEDIATE ? ?Disposition: HOME WITH FAMILY  ? ?Follow up with Dr. Lanney Gins in 5 days for result discussion. ? ? ? ? ?Ottie Glazier MD  ?[place] ?Division of Pulmonary & Critical Care Medicine ? ? ?

## 2022-02-14 ENCOUNTER — Other Ambulatory Visit: Payer: Self-pay | Admitting: Anatomic Pathology & Clinical Pathology

## 2022-02-14 ENCOUNTER — Encounter: Payer: Self-pay | Admitting: Pulmonary Disease

## 2022-02-14 LAB — CYTOLOGY - NON PAP

## 2022-02-14 LAB — SURGICAL PATHOLOGY

## 2022-02-16 ENCOUNTER — Encounter: Payer: Self-pay | Admitting: *Deleted

## 2022-02-16 ENCOUNTER — Ambulatory Visit
Admission: RE | Admit: 2022-02-16 | Discharge: 2022-02-16 | Disposition: A | Discharge status: Home / Self Care | Payer: BC Managed Care – PPO | Source: Ambulatory Visit | Attending: Internal Medicine | Admitting: Internal Medicine

## 2022-02-16 ENCOUNTER — Telehealth: Payer: Self-pay

## 2022-02-16 ENCOUNTER — Inpatient Hospital Stay: Payer: BC Managed Care – PPO

## 2022-02-16 ENCOUNTER — Inpatient Hospital Stay: Payer: BC Managed Care – PPO | Attending: Internal Medicine | Admitting: Internal Medicine

## 2022-02-16 ENCOUNTER — Encounter: Payer: Self-pay | Admitting: Internal Medicine

## 2022-02-16 DIAGNOSIS — C3412 Malignant neoplasm of upper lobe, left bronchus or lung: Secondary | ICD-10-CM | POA: Diagnosis present

## 2022-02-16 DIAGNOSIS — R7989 Other specified abnormal findings of blood chemistry: Secondary | ICD-10-CM

## 2022-02-16 DIAGNOSIS — J449 Chronic obstructive pulmonary disease, unspecified: Secondary | ICD-10-CM | POA: Insufficient documentation

## 2022-02-16 DIAGNOSIS — C3490 Malignant neoplasm of unspecified part of unspecified bronchus or lung: Secondary | ICD-10-CM | POA: Diagnosis present

## 2022-02-16 DIAGNOSIS — F17210 Nicotine dependence, cigarettes, uncomplicated: Secondary | ICD-10-CM | POA: Diagnosis not present

## 2022-02-16 DIAGNOSIS — C7951 Secondary malignant neoplasm of bone: Secondary | ICD-10-CM | POA: Diagnosis not present

## 2022-02-16 DIAGNOSIS — R5383 Other fatigue: Secondary | ICD-10-CM | POA: Insufficient documentation

## 2022-02-16 DIAGNOSIS — Z800 Family history of malignant neoplasm of digestive organs: Secondary | ICD-10-CM | POA: Insufficient documentation

## 2022-02-16 DIAGNOSIS — R062 Wheezing: Secondary | ICD-10-CM | POA: Diagnosis not present

## 2022-02-16 LAB — CBC WITH DIFFERENTIAL/PLATELET
Abs Immature Granulocytes: 0.43 10*3/uL — ABNORMAL HIGH (ref 0.00–0.07)
Basophils Absolute: 0.1 10*3/uL (ref 0.0–0.1)
Basophils Relative: 1 %
Eosinophils Absolute: 1.3 10*3/uL — ABNORMAL HIGH (ref 0.0–0.5)
Eosinophils Relative: 10 %
HCT: 41.3 % (ref 39.0–52.0)
Hemoglobin: 14.2 g/dL (ref 13.0–17.0)
Immature Granulocytes: 4 %
Lymphocytes Relative: 14 %
Lymphs Abs: 1.7 10*3/uL (ref 0.7–4.0)
MCH: 30.3 pg (ref 26.0–34.0)
MCHC: 34.4 g/dL (ref 30.0–36.0)
MCV: 88.2 fL (ref 80.0–100.0)
Monocytes Absolute: 1.2 10*3/uL — ABNORMAL HIGH (ref 0.1–1.0)
Monocytes Relative: 10 %
Neutro Abs: 7.5 10*3/uL (ref 1.7–7.7)
Neutrophils Relative %: 61 %
Platelets: 222 10*3/uL (ref 150–400)
RBC: 4.68 MIL/uL (ref 4.22–5.81)
RDW: 13.5 % (ref 11.5–15.5)
WBC: 12.3 10*3/uL — ABNORMAL HIGH (ref 4.0–10.5)
nRBC: 0 % (ref 0.0–0.2)

## 2022-02-16 LAB — COMPREHENSIVE METABOLIC PANEL
ALT: 145 U/L — ABNORMAL HIGH (ref 0–44)
AST: 109 U/L — ABNORMAL HIGH (ref 15–41)
Albumin: 3.6 g/dL (ref 3.5–5.0)
Alkaline Phosphatase: 231 U/L — ABNORMAL HIGH (ref 38–126)
Anion gap: 9 (ref 5–15)
BUN: 10 mg/dL (ref 6–20)
CO2: 25 mmol/L (ref 22–32)
Calcium: 9.4 mg/dL (ref 8.9–10.3)
Chloride: 104 mmol/L (ref 98–111)
Creatinine, Ser: 0.83 mg/dL (ref 0.61–1.24)
GFR, Estimated: >60 mL/min (ref >60)
Glucose, Bld: 102 mg/dL — ABNORMAL HIGH (ref 70–99)
Potassium: 3.7 mmol/L (ref 3.5–5.1)
Sodium: 138 mmol/L (ref 135–145)
Total Bilirubin: 0.6 mg/dL (ref 0.3–1.2)
Total Protein: 8.1 g/dL (ref 6.5–8.1)

## 2022-02-16 LAB — PSA: Prostatic Specific Antigen: 0.38 ng/mL (ref 0.00–4.00)

## 2022-02-16 LAB — APTT: aPTT: 31 seconds (ref 24–36)

## 2022-02-16 LAB — LACTATE DEHYDROGENASE: LDH: 419 U/L — ABNORMAL HIGH (ref 98–192)

## 2022-02-16 LAB — PROTIME-INR
INR: 1 (ref 0.8–1.2)
Prothrombin Time: 13 seconds (ref 11.4–15.2)

## 2022-02-16 MED ORDER — PROCHLORPERAZINE MALEATE 10 MG PO TABS: 10.0000 mg | ORAL_TABLET | Freq: Four times a day (QID) | ORAL | 1 refills | Status: DC | PRN

## 2022-02-16 MED ORDER — TRAMADOL HCL 50 MG PO TABS
50.0000 mg | ORAL_TABLET | Freq: Four times a day (QID) | ORAL | 0 refills | Status: DC | PRN
Start: 2022-02-16 — End: 2022-05-15

## 2022-02-16 MED ORDER — GADOBUTROL 1 MMOL/ML IV SOLN
6.0000 mL | Freq: Once | INTRAVENOUS | Status: AC | PRN
  Administered 2022-02-16: 6 mL via INTRAVENOUS

## 2022-02-16 MED ORDER — LIDOCAINE-PRILOCAINE 2.5-2.5 % EX CREA: TOPICAL_CREAM | CUTANEOUS | 3 refills | Status: AC

## 2022-02-16 MED ORDER — ONDANSETRON HCL 8 MG PO TABS: ORAL_TABLET | ORAL | 1 refills | Status: DC

## 2022-02-16 NOTE — Addendum Note (Signed)
Addended by: Telford Nab on: 02/16/2022 02:28 PM ? ? Modules accepted: Orders ? ?

## 2022-02-16 NOTE — Progress Notes (Signed)
C/o back pain

## 2022-02-16 NOTE — Progress Notes (Signed)
Pt has been made aware of results from brain MRI and labwork from today. Informed that will continue with chemotherapy as planned and hold off on radiation at this time. Notified that will recheck labs on Monday as well to follow up on abnormal LFT's. Instructed pt to call with any questions or needs. Pt verbalized understanding.  ?

## 2022-02-16 NOTE — Progress Notes (Signed)
START ON PATHWAY REGIMEN - Small Cell Lung ? ? ?  Cycles 1 through 4, every 21 days: ?    Atezolizumab  ?    Carboplatin  ?    Etoposide  ?  Cycles 5 and beyond, every 21 days: ?    Atezolizumab  ? ?**Always confirm dose/schedule in your pharmacy ordering system** ? ?Patient Characteristics: ?Newly Diagnosed, Preoperative or Nonsurgical Candidate (Clinical Staging), First Line, Extensive Stage ?Therapeutic Status: Newly Diagnosed, Preoperative or Nonsurgical Candidate (Clinical Staging) ?AJCC T Category: cT3 ?AJCC N Category: cN2 ?AJCC M Category: cM1c ?AJCC 8 Stage Grouping: IVB ?Stage Classification: Extensive ? ?Intent of Therapy: ?Non-Curative / Palliative Intent, Discussed with Patient ?

## 2022-02-16 NOTE — Anesthesia Postprocedure Evaluation (Signed)
Anesthesia Post Note ? ?Patient: Jose Morse ? ?Procedure(s) Performed: VIDEO BRONCHOSCOPY WITH ENDOBRONCHIAL ULTRASOUND ?VIDEO BRONCHOSCOPY WITH ENDOBRONCHIAL NAVIGATION ? ?Patient location during evaluation: PACU ?Anesthesia Type: General ?Level of consciousness: awake and oriented ?Pain management: pain level controlled ?Vital Signs Assessment: post-procedure vital signs reviewed and stable ?Respiratory status: spontaneous breathing and nonlabored ventilation ?Cardiovascular status: stable ?Anesthetic complications: no ? ? ?No notable events documented. ? ? ?Last Vitals:  ?Vitals:  ? 02/10/22 1445 02/10/22 1503  ?BP: 120/80 123/82  ?Pulse: 80 88  ?Resp: 19 18  ?Temp:  36.4 ?C  ?SpO2: 92% 96%  ?  ?Last Pain:  ?Vitals:  ? 02/10/22 1503  ?TempSrc: Temporal  ?PainSc: 0-No pain  ? ? ?  ?  ?  ?  ?  ?  ? ?VAN STAVEREN,Frady Taddeo ? ? ? ? ?

## 2022-02-16 NOTE — Assessment & Plan Note (Addendum)
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  I reviewed the findings of imaging/pathology- with patient and family in detail.  Patient also need brain MRI-given the risk of metastasis to brain. ? ?#Discussed chemo immunotherapy for extensive stage small cell lung cancer includes carbo etoposide-Tecentriq every 3 weeks x 4 cycles.  Response would be evaluated with interim imaging.  We will also recommend maintenance Tecentriq post chemotherapy.  I discussed the schedule in detail.  Discussed treatments are palliative not curative ~discussed the median survival is in the range of around 12 months.  ? ?Discussed the potential side effects including but not limited to-increasing fatigue, nausea vomiting, diarrhea, hair loss, sores in the mouth, increase risk of infection and also neuropathy.  ? ?I discussed the mechanism of action of immunotherapy. Discussed the potential side effects of immunotherapy including but not limited to diarrhea; skin rash; elevated LFTs/endocrine abnormalities etc. I discussed regarding Chemotherapy education; port placement. Antiemetics-Zofran and Compazine; EMLA cream sent to pharmacy.  ? 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  Also recommend bisphosphonates.  Discussed regarding Zometa-4 mg IV every 4 to 6 weeks.  Discussed the potential side effects including but not limited to hypocalcemia ONJ.  Discussed the benefits outweigh the risk.  Patient in agreement to proceed with treatment. ? ?# Smoking: Active smoker; follow-up in the process of quitting. ? ?# COPD: Continue inhalers [Dr.Aleskerov]- STABLE.  ? ?Thank you Dr.Aleskerov for allowing me to participate in the care of your pleasant patient. Please do not hesitate to contact me with questions or concerns in the interim.  Discussed with [facility]. ? ?# DISPOSITION: ?#  Chemo  Education ASAP ?# MRI stat ?# labs today ?# port referral to IR ?#  follow up with MD-  d-1 of chemo; carbo-etop-tecentriq; Zometa; d-2 etop; d-3 etop; - Dr.B ? ? ?# I reviewed the blood work- with the patient in detail; also reviewed the imaging independently [as summarized above]; and with the patient in detail.  ? ?

## 2022-02-16 NOTE — Progress Notes (Signed)
[facility] ?CONSULT NOTE ? ?Patient Care Team: ?Tracie Harrier, MD as PCP - General (Internal Medicine) ? ?CHIEF COMPLAINTS/PURPOSE OF CONSULTATION: lung cancer ? ?#  ?Oncology History Overview Note  ?# . LUNG, LEFT UPPER LOBE; ENB-GUIDED TRANSBRONCHIAL FORCEPS BIOPSY: - SMALL CELL CARCINOMA.  [Dr.Aleskerov] ? ?Comment:  ?Invasive carcinoma is present in two fragments, with crush artifact. The  ?cells are small, with scant cytoplasm, nuclear molding, and  ?inconspicuous nucleoli. There is necrosis.  ? ?Immunohistochemistry (IHC) was performed for further characterization.  ?The neoplastic cells are positive for CD56 with strong staining of over  ?90% of cells. They are negative for TTF-1 and p40.  ? ? ?IMPRESSION: ?1. LEFT suprahilar mass constricting LEFT upper lobe bronchus ?consistent with primary bronchogenic carcinoma. ?2. Ipsilateral mediastinal nodal metastasis. ?3. Distant visceral metastasis to the LIVER and bilateral ADRENAL ?GLANDS. ?4. Distant nodal metastasis to the LEFT external iliac node. ?5. Multifocal hypermetabolic skeletal metastasis (approximately 40 ?lesions). Lesions are occult by CT imaging. ?6. Focal activity beneath the skin posterior to the RIGHT ear. ?Differential includes nodal metastasis versus primary parotid ?neoplasm. Favor nodal metastasis. (Recommend attention on brain MRI ?workup). ?  ?  ?Electronically Signed ?  By: Suzy Bouchard M.D. ?  On: 02/06/2022 13:00 ?  ?Primary cancer of left upper lobe of lung ([place])  ?02/16/2022 Initial Diagnosis  ? Primary cancer of left upper lobe of lung ([place]) ?  ?02/16/2022 Cancer Staging  ? Staging form: Lung, AJCC 8th Edition ?- Clinical: Stage IVB (cT3, cN2, pM1c) - Signed by Cammie Sickle, MD on 02/16/2022 ?Stage prefix: Initial diagnosis ? ?  ?02/16/2022 -  Chemotherapy  ? Patient is on Treatment Plan : LUNG SCLC Carboplatin + Etoposide + Atezolizumab Induction q21d / Atezolizumab Maintenance q21d  ? ?  ?   ? ? ? ?HISTORY OF PRESENTING ILLNESS: Ambulating independently.  Accompanied by his wife. ?Jose Morse 51 y.o.  male history of smoking is here for further evaluation and recommendations for lung cancer.  ? ?Patient noted to have intermittent chest pain few weeks ago.  Work-up including a chest x-ray suggestive of pneumonia.  Treated with antibiotics-however symptoms did not completely resolve. ?To further work-up with CT scan/evaluation with pulmonary.  ? ?Patient complains of wheezing.  Denies any worsening shortness of breath or cough.  Denies hemoptysis.  Denies any worsening headaches.  Appetite is good.  No weight loss. ? ? ?Review of Systems  ?Constitutional:  Positive for malaise/fatigue. Negative for chills, diaphoresis, fever and weight loss.  ?HENT:  Negative for nosebleeds and sore throat.   ?Eyes:  Negative for double vision.  ?Respiratory:  Positive for wheezing. Negative for cough, hemoptysis, sputum production and shortness of breath.   ?Cardiovascular:  Positive for chest pain. Negative for palpitations, orthopnea and leg swelling.  ?Gastrointestinal:  Negative for abdominal pain, blood in stool, constipation, diarrhea, heartburn, melena, nausea and vomiting.  ?Genitourinary:  Negative for dysuria, frequency and urgency.  ?Musculoskeletal:  Positive for back pain and joint pain.  ?Skin: Negative.  Negative for itching and rash.  ?Neurological:  Negative for dizziness, tingling, focal weakness, weakness and headaches.  ?Endo/Heme/Allergies:  Does not bruise/bleed easily.  ?Psychiatric/Behavioral:  Negative for depression. The patient is not nervous/anxious and does not have insomnia.    ? ?MEDICAL HISTORY:  ?Past Medical History:  ?Diagnosis Date  ? History of kidney stones   ? Hypercholesteremia   ? ? ?SURGICAL HISTORY: ?Past Surgical History:  ?Procedure Laterality Date  ? CHOLECYSTECTOMY    ?  VIDEO BRONCHOSCOPY WITH ENDOBRONCHIAL NAVIGATION N/A 02/10/2022  ? Procedure: VIDEO BRONCHOSCOPY WITH  ENDOBRONCHIAL NAVIGATION;  Surgeon: Ottie Glazier, MD;  Location: [facility] ORS;  Service: Thoracic;  Laterality: N/A;  ? VIDEO BRONCHOSCOPY WITH ENDOBRONCHIAL ULTRASOUND N/A 02/10/2022  ? Procedure: VIDEO BRONCHOSCOPY WITH ENDOBRONCHIAL ULTRASOUND;  Surgeon: Ottie Glazier, MD;  Location: [facility] ORS;  Service: Thoracic;  Laterality: N/A;  ? ? ?SOCIAL HISTORY: ?Social History  ? ?Socioeconomic History  ? Marital status: Married  ?  Spouse name: Not on file  ? Number of children: Not on file  ? Years of education: Not on file  ? Highest education level: Not on file  ?Occupational History  ? Not on file  ?Tobacco Use  ? Smoking status: Every Day  ?  Packs/day: 0.50  ?  Years: 34.00  ?  Pack years: 17.00  ?  Types: Cigarettes  ? Smokeless tobacco: Never  ?Vaping Use  ? Vaping Use: Never used  ?Substance and Sexual Activity  ? Alcohol use: No  ? Drug use: Not Currently  ?  Types: Marijuana  ? Sexual activity: Yes  ?Other Topics Concern  ? Not on file  ?Social History Narrative  ? Lives in [place]; with wife/son- 31 y [2023]; works in [employer]. Smoker; no alcohol.   ? ?Social Determinants of Health  ? ?Financial Resource Strain: Not on file  ?Food Insecurity: Not on file  ?Transportation Needs: Not on file  ?Physical Activity: Not on file  ?Stress: Not on file  ?Social Connections: Not on file  ?Intimate Partner Violence: Not on file  ? ? ?FAMILY HISTORY: ?Family History  ?Problem Relation Age of Onset  ? Ovarian cancer Mother   ? Colon cancer Father   ? Diabetes Father   ? ? ?ALLERGIES:  is allergic to codeine. ? ?MEDICATIONS:  ?Current Outpatient Medications  ?Medication Sig Dispense Refill  ? acetaminophen-codeine (TYLENOL #3) 300-30 MG tablet Take 1 tablet by mouth every 4 (four) hours as needed for moderate pain.    ? albuterol (PROVENTIL HFA) 108 (90 Base) MCG/ACT inhaler Inhale 2 puffs into the lungs every 4 (four) hours as needed for wheezing or shortness of breath. 1 each 0  ? Budeson-Glycopyrrol-Formoterol  (BREZTRI AEROSPHERE) 160-9-4.8 MCG/ACT AERO Inhale 2 puffs into the lungs in the morning and at bedtime.    ? lidocaine-prilocaine (EMLA) cream Apply on the port. 30 -45 min  prior to port access. 30 g 3  ? ondansetron (ZOFRAN) 8 MG tablet One pill every 8 hours as needed for nausea/vomitting. 40 tablet 1  ? prochlorperazine (COMPAZINE) 10 MG tablet Take 1 tablet (10 mg total) by mouth every 6 (six) hours as needed for nausea or vomiting. 40 tablet 1  ? rosuvastatin (CRESTOR) 5 MG tablet Take 5 mg by mouth at bedtime.    ? traMADol (ULTRAM) 50 MG tablet Take 1 tablet (50 mg total) by mouth every 6 (six) hours as needed. 60 tablet 0  ? ?No current facility-administered medications for this visit.  ? ? ?  ?. ? ?PHYSICAL EXAMINATION: ?ECOG PERFORMANCE STATUS: 1 - Symptomatic but completely ambulatory ? ?Vitals:  ? 02/16/22 0828  ?BP: (!) 157/94  ?Pulse: 69  ?Temp: (!) 97.3 ?F (36.3 ?C)  ?SpO2: 99%  ? ?Filed Weights  ? 02/16/22 0828  ?Weight: 146 lb 12.8 oz (66.6 kg)  ? ? ?Physical Exam ?Vitals and nursing note reviewed.  ?HENT:  ?   Head: Normocephalic and atraumatic.  ?   Mouth/Throat:  ?   Pharynx:  Oropharynx is clear.  ?Eyes:  ?   Extraocular Movements: Extraocular movements intact.  ?   Pupils: Pupils are equal, round, and reactive to light.  ?Cardiovascular:  ?   Rate and Rhythm: Normal rate and regular rhythm.  ?Pulmonary:  ?   Comments: Decreased breath sounds bilaterally.  ?Abdominal:  ?   Palpations: Abdomen is soft.  ?Musculoskeletal:     ?   General: Normal range of motion.  ?   Cervical back: Normal range of motion.  ?Skin: ?   General: Skin is warm.  ?Neurological:  ?   General: No focal deficit present.  ?   Mental Status: He is alert and oriented to person, place, and time.  ?Psychiatric:     ?   Behavior: Behavior normal.     ?   Judgment: Judgment normal.  ? ? ? ?LABORATORY DATA:  ?I have reviewed the data as listed ?Lab Results  ?Component Value Date  ? WBC 9.4 02/09/2022  ? HGB 14.6 02/09/2022  ? HCT  44.8 02/09/2022  ? MCV 90.0 02/09/2022  ? PLT 248 02/09/2022  ? ?Recent Labs  ?  12/28/21 ?0900 01/02/22 ?2327 01/04/22 ?1610  ?NA 141 139 137  ?K 3.8 3.4* 3.9  ?CL 107 103 106  ?CO2 26 26 24   ?GLUCOSE 98 111* 117*  ?B

## 2022-02-16 NOTE — Progress Notes (Signed)
Met with patient and his wife during initial consult with Dr. Rogue Bussing to discuss recent pathology results and treatment options. All questions answered during visit. Reviewed upcoming appts. Contact info given and instructed to call with any questions or needs. Pt verbalized understanding. ?

## 2022-02-16 NOTE — Telephone Encounter (Signed)
Prior authorization for EMLA cream submitted online via cover my meds (Key: [id number]) ? ? ?If [employer] [facility] has not responded within the specified timeframe or if you have any questions about your PA submission, contact [place] directly at [phone]. ?

## 2022-02-17 ENCOUNTER — Encounter: Payer: Self-pay | Admitting: Internal Medicine

## 2022-02-17 ENCOUNTER — Telehealth: Payer: Self-pay | Admitting: Internal Medicine

## 2022-02-17 LAB — CEA: CEA: 0.9 ng/mL (ref 0.0–4.7)

## 2022-02-17 NOTE — Progress Notes (Signed)
I spoke to patient regarding the results of the brain MRI/elevated liver numbers. Hold of radiation at this time. Will discuss further at next visit.

## 2022-02-17 NOTE — Telephone Encounter (Signed)
PA still pending at this time.  Called the PA department at [facility] directly at [phone] and they are going expedite the review that could take up to 24 hours. ?

## 2022-02-17 NOTE — Telephone Encounter (Signed)
I called and left a message for the patient to call us back regarding the results of the MRI brain.  ? ?Hayley-please follow-up. Thanks ?GB ?

## 2022-02-17 NOTE — Telephone Encounter (Signed)
Spoke with patient yesterday about results from brain MRI. Pt is aware of results and MD recommendations to continue with chemotherapy as planned and hold off on brain radiation at this time.  ?

## 2022-02-20 ENCOUNTER — Inpatient Hospital Stay: Payer: BC Managed Care – PPO | Attending: Internal Medicine

## 2022-02-20 ENCOUNTER — Inpatient Hospital Stay: Payer: BC Managed Care – PPO

## 2022-02-20 ENCOUNTER — Inpatient Hospital Stay (HOSPITAL_BASED_OUTPATIENT_CLINIC_OR_DEPARTMENT_OTHER): Payer: BC Managed Care – PPO | Admitting: Internal Medicine

## 2022-02-20 ENCOUNTER — Encounter: Payer: Self-pay | Admitting: *Deleted

## 2022-02-20 ENCOUNTER — Encounter: Payer: Self-pay | Admitting: Internal Medicine

## 2022-02-20 DIAGNOSIS — R5383 Other fatigue: Secondary | ICD-10-CM | POA: Diagnosis not present

## 2022-02-20 DIAGNOSIS — C7931 Secondary malignant neoplasm of brain: Secondary | ICD-10-CM | POA: Insufficient documentation

## 2022-02-20 DIAGNOSIS — C3412 Malignant neoplasm of upper lobe, left bronchus or lung: Secondary | ICD-10-CM | POA: Diagnosis present

## 2022-02-20 DIAGNOSIS — Z5189 Encounter for other specified aftercare: Secondary | ICD-10-CM | POA: Insufficient documentation

## 2022-02-20 DIAGNOSIS — E876 Hypokalemia: Secondary | ICD-10-CM | POA: Diagnosis not present

## 2022-02-20 DIAGNOSIS — R7989 Other specified abnormal findings of blood chemistry: Secondary | ICD-10-CM

## 2022-02-20 DIAGNOSIS — J449 Chronic obstructive pulmonary disease, unspecified: Secondary | ICD-10-CM | POA: Insufficient documentation

## 2022-02-20 DIAGNOSIS — Z5111 Encounter for antineoplastic chemotherapy: Secondary | ICD-10-CM | POA: Insufficient documentation

## 2022-02-20 DIAGNOSIS — Z800 Family history of malignant neoplasm of digestive organs: Secondary | ICD-10-CM | POA: Diagnosis not present

## 2022-02-20 DIAGNOSIS — R062 Wheezing: Secondary | ICD-10-CM | POA: Diagnosis not present

## 2022-02-20 DIAGNOSIS — F17210 Nicotine dependence, cigarettes, uncomplicated: Secondary | ICD-10-CM | POA: Insufficient documentation

## 2022-02-20 DIAGNOSIS — Z8041 Family history of malignant neoplasm of ovary: Secondary | ICD-10-CM | POA: Diagnosis not present

## 2022-02-20 DIAGNOSIS — C7951 Secondary malignant neoplasm of bone: Secondary | ICD-10-CM | POA: Insufficient documentation

## 2022-02-20 DIAGNOSIS — Z5112 Encounter for antineoplastic immunotherapy: Secondary | ICD-10-CM | POA: Diagnosis present

## 2022-02-20 LAB — COMPREHENSIVE METABOLIC PANEL
ALT: 64 U/L — ABNORMAL HIGH (ref 0–44)
AST: 38 U/L (ref 15–41)
Albumin: 3.5 g/dL (ref 3.5–5.0)
Alkaline Phosphatase: 189 U/L — ABNORMAL HIGH (ref 38–126)
Anion gap: 8 (ref 5–15)
BUN: 12 mg/dL (ref 6–20)
CO2: 27 mmol/L (ref 22–32)
Calcium: 9.6 mg/dL (ref 8.9–10.3)
Chloride: 102 mmol/L (ref 98–111)
Creatinine, Ser: 0.91 mg/dL (ref 0.61–1.24)
GFR, Estimated: >60 mL/min (ref >60)
Glucose, Bld: 122 mg/dL — ABNORMAL HIGH (ref 70–99)
Potassium: 3.5 mmol/L (ref 3.5–5.1)
Sodium: 137 mmol/L (ref 135–145)
Total Bilirubin: 0.7 mg/dL (ref 0.3–1.2)
Total Protein: 8.1 g/dL (ref 6.5–8.1)

## 2022-02-20 LAB — HEPATITIS PANEL, ACUTE
HCV Ab: NONREACTIVE
Hep A IgM: NONREACTIVE
Hep B C IgM: NONREACTIVE
Hepatitis B Surface Ag: NONREACTIVE

## 2022-02-20 MED ORDER — SODIUM CHLORIDE 0.9 % IV SOLN
10.0000 mg | Freq: Once | INTRAVENOUS | Status: AC
  Administered 2022-02-20: 10 mg via INTRAVENOUS
  Filled 2022-02-20: qty 10

## 2022-02-20 MED ORDER — SODIUM CHLORIDE 0.9 % IV SOLN
100.0000 mg/m2 | Freq: Once | INTRAVENOUS | Status: AC
  Administered 2022-02-20: 180 mg via INTRAVENOUS
  Filled 2022-02-20: qty 9

## 2022-02-20 MED ORDER — MORPHINE SULFATE (PF) 2 MG/ML IV SOLN
2.0000 mg | Freq: Once | INTRAVENOUS | Status: AC | PRN
  Administered 2022-02-20: 2 mg via INTRAVENOUS
  Filled 2022-02-20: qty 1

## 2022-02-20 MED ORDER — SODIUM CHLORIDE 0.9 % IV SOLN
Freq: Once | INTRAVENOUS | Status: AC
  Filled 2022-02-20: qty 250

## 2022-02-20 MED ORDER — PALONOSETRON HCL INJECTION 0.25 MG/5ML
0.2500 mg | Freq: Once | INTRAVENOUS | Status: AC
  Administered 2022-02-20: 0.25 mg via INTRAVENOUS
  Filled 2022-02-20: qty 5

## 2022-02-20 MED ORDER — SODIUM CHLORIDE 0.9 % IV SOLN
1200.0000 mg | Freq: Once | INTRAVENOUS | Status: AC
  Administered 2022-02-20: 1200 mg via INTRAVENOUS
  Filled 2022-02-20: qty 20

## 2022-02-20 MED ORDER — OXYCODONE-ACETAMINOPHEN 5-325 MG PO TABS: 1.0000 | ORAL_TABLET | Freq: Four times a day (QID) | ORAL | 0 refills | Status: DC | PRN

## 2022-02-20 MED ORDER — SODIUM CHLORIDE 0.9 % IV SOLN
570.0000 mg | Freq: Once | INTRAVENOUS | Status: AC
  Administered 2022-02-20: 570 mg via INTRAVENOUS
  Filled 2022-02-20: qty 57

## 2022-02-20 MED ORDER — SODIUM CHLORIDE 0.9 % IV SOLN
150.0000 mg | Freq: Once | INTRAVENOUS | Status: AC
  Administered 2022-02-20: 150 mg via INTRAVENOUS
  Filled 2022-02-20: qty 150

## 2022-02-20 NOTE — Progress Notes (Signed)
Patient on schedule for Port placement 02/23/2022, called and spoke with patients wife on phone with pre procedure instructions given. Made aware to be here @ 0900, NPO after mn prior to procedure as well as driver post procedure/recovery/discharge. Stated understanding. ?

## 2022-02-20 NOTE — Telephone Encounter (Signed)
PA for EMLA cream has been denied.  Reason listed is Lidocaine-Prilocaine cream is approved for painful skin that is not open or cut, when over the counter Lidocaine has been tried and it did not work. ? ?Will inform patient of above at his MD visit today (02/20/22). ?

## 2022-02-20 NOTE — Progress Notes (Signed)
Met with patient and his wife during follow up visit today to start first chemo treatment. All questions answered during visit. Reassurance provided. Reviewed upcoming appts with patient. Instructed to call with any questions or needs. Pt verbalized understanding. ?

## 2022-02-20 NOTE — Progress Notes (Signed)
[facility] ?CONSULT NOTE ? ?Patient Care Team: ?Tracie Harrier, MD as PCP - General (Internal Medicine) ?Telford Nab, RN as Sales executive ? ?CHIEF COMPLAINTS/PURPOSE OF CONSULTATION: lung cancer ? ?#  ?Oncology History Overview Note  ?# . LUNG, LEFT UPPER LOBE; ENB-GUIDED TRANSBRONCHIAL FORCEPS BIOPSY: - SMALL CELL CARCINOMA.  [Dr.Aleskerov] ? ?Comment:  ?Invasive carcinoma is present in two fragments, with crush artifact. The  ?cells are small, with scant cytoplasm, nuclear molding, and  ?inconspicuous nucleoli. There is necrosis.  ? ?Immunohistochemistry (IHC) was performed for further characterization.  ?The neoplastic cells are positive for CD56 with strong staining of over  ?90% of cells. They are negative for TTF-1 and p40.  ? ? ?IMPRESSION: ?1. LEFT suprahilar mass constricting LEFT upper lobe bronchus ?consistent with primary bronchogenic carcinoma. ?2. Ipsilateral mediastinal nodal metastasis. ?3. Distant visceral metastasis to the LIVER and bilateral ADRENAL ?GLANDS. ?4. Distant nodal metastasis to the LEFT external iliac node. ?5. Multifocal hypermetabolic skeletal metastasis (approximately 40 ?lesions). Lesions are occult by CT imaging. ?6. Focal activity beneath the skin posterior to the RIGHT ear. ?Differential includes nodal metastasis versus primary parotid ?neoplasm. Favor nodal metastasis. (Recommend attention on brain MRI ?workup). ?  ?  ?Electronically Signed ?  By: Suzy Bouchard M.D. ?  On: 02/06/2022 13:00 ? ?# April 2023-brain MRI incidental/screening 4 mm x 2 cerebellar lesions. ? ?# MAY 1st, 2023- carboo-Etop [d-1-3]; udenyca ?  ?Primary cancer of left upper lobe of lung ([place])  ?02/16/2022 Initial Diagnosis  ? Primary cancer of left upper lobe of lung ([place]) ?  ?02/16/2022 Cancer Staging  ? Staging form: Lung, AJCC 8th Edition ?- Clinical: Stage IVB (cT3, cN2, pM1c) - Signed by Cammie Sickle, MD on 02/16/2022 ?Stage prefix: Initial diagnosis ? ?   ?02/20/2022 -  Chemotherapy  ? Patient is on Treatment Plan : LUNG SCLC Carboplatin + Etoposide + Atezolizumab Induction q21d / Atezolizumab Maintenance q21d  ? ?  ?  ? ? ? ?HISTORY OF PRESENTING ILLNESS: Ambulating independently.  Alone.  ? ?TAJAY MUZZY 51 y.o.  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 ? ?Patient continues to complain of worsening pain in his lower back hips.  Currently on tramadol.  Does not notice significant improvement.  ? ?Patient complains of wheezing.  Denies any worsening shortness of breath or cough.  Denies hemoptysis.  Denies any worsening headaches.  Appetite is good.  No weight loss. ? ? ?Review of Systems  ?Constitutional:  Positive for malaise/fatigue. Negative for chills, diaphoresis, fever and weight loss.  ?HENT:  Negative for nosebleeds and sore throat.   ?Eyes:  Negative for double vision.  ?Respiratory:  Positive for wheezing. Negative for cough, hemoptysis, sputum production and shortness of breath.   ?Cardiovascular:  Positive for chest pain. Negative for palpitations, orthopnea and leg swelling.  ?Gastrointestinal:  Negative for abdominal pain, blood in stool, constipation, diarrhea, heartburn, melena, nausea and vomiting.  ?Genitourinary:  Negative for dysuria, frequency and urgency.  ?Musculoskeletal:  Positive for back pain and joint pain.  ?Skin: Negative.  Negative for itching and rash.  ?Neurological:  Negative for dizziness, tingling, focal weakness, weakness and headaches.  ?Endo/Heme/Allergies:  Does not bruise/bleed easily.  ?Psychiatric/Behavioral:  Negative for depression. The patient is not nervous/anxious and does not have insomnia.    ? ?MEDICAL HISTORY:  ?Past Medical History:  ?Diagnosis Date  ? History of kidney stones   ? Hypercholesteremia   ? ? ?  SURGICAL HISTORY: ?Past Surgical History:  ?Procedure Laterality Date  ? CHOLECYSTECTOMY    ? VIDEO BRONCHOSCOPY  WITH ENDOBRONCHIAL NAVIGATION N/A 02/10/2022  ? Procedure: VIDEO BRONCHOSCOPY WITH ENDOBRONCHIAL NAVIGATION;  Surgeon: Ottie Glazier, MD;  Location: [facility] ORS;  Service: Thoracic;  Laterality: N/A;  ? VIDEO BRONCHOSCOPY WITH ENDOBRONCHIAL ULTRASOUND N/A 02/10/2022  ? Procedure: VIDEO BRONCHOSCOPY WITH ENDOBRONCHIAL ULTRASOUND;  Surgeon: Ottie Glazier, MD;  Location: [facility] ORS;  Service: Thoracic;  Laterality: N/A;  ? ? ?SOCIAL HISTORY: ?Social History  ? ?Socioeconomic History  ? Marital status: Married  ?  Spouse name: Not on file  ? Number of children: Not on file  ? Years of education: Not on file  ? Highest education level: Not on file  ?Occupational History  ? Not on file  ?Tobacco Use  ? Smoking status: Every Day  ?  Packs/day: 0.50  ?  Years: 34.00  ?  Pack years: 17.00  ?  Types: Cigarettes  ? Smokeless tobacco: Never  ?Vaping Use  ? Vaping Use: Never used  ?Substance and Sexual Activity  ? Alcohol use: No  ? Drug use: Not Currently  ?  Types: Marijuana  ? Sexual activity: Yes  ?Other Topics Concern  ? Not on file  ?Social History Narrative  ? Lives in [place]; with wife/son- 47 y [2023]; works in Architect. Smoker; no alcohol.   ? ?Social Determinants of Health  ? ?Financial Resource Strain: Not on file  ?Food Insecurity: Not on file  ?Transportation Needs: Not on file  ?Physical Activity: Not on file  ?Stress: Not on file  ?Social Connections: Not on file  ?Intimate Partner Violence: Not on file  ? ? ?FAMILY HISTORY: ?Family History  ?Problem Relation Age of Onset  ? Ovarian cancer Mother   ? Colon cancer Father   ? Diabetes Father   ? ? ?ALLERGIES:  is allergic to codeine. ? ?MEDICATIONS:  ?Current Outpatient Medications  ?Medication Sig Dispense Refill  ? albuterol (PROVENTIL HFA) 108 (90 Base) MCG/ACT inhaler Inhale 2 puffs into the lungs every 4 (four) hours as needed for wheezing or shortness of breath. 1 each 0  ? Budeson-Glycopyrrol-Formoterol (BREZTRI AEROSPHERE) 160-9-4.8 MCG/ACT AERO Inhale 2  puffs into the lungs in the morning and at bedtime.    ? ondansetron (ZOFRAN) 8 MG tablet One pill every 8 hours as needed for nausea/vomitting. 40 tablet 1  ? oxyCODONE-acetaminophen (PERCOCET/ROXICET) 5-325 MG tablet Take 1 tablet by mouth every 6 (six) hours as needed for severe pain. 60 tablet 0  ? prochlorperazine (COMPAZINE) 10 MG tablet Take 1 tablet (10 mg total) by mouth every 6 (six) hours as needed for nausea or vomiting. 40 tablet 1  ? rosuvastatin (CRESTOR) 5 MG tablet Take 5 mg by mouth at bedtime.    ? traMADol (ULTRAM) 50 MG tablet Take 1 tablet (50 mg total) by mouth every 6 (six) hours as needed. 60 tablet 0  ? acetaminophen-codeine (TYLENOL #3) 300-30 MG tablet Take 1 tablet by mouth every 4 (four) hours as needed for moderate pain. (Patient not taking: Reported on 02/20/2022)    ? lidocaine-prilocaine (EMLA) cream Apply on the port. 30 -45 min  prior to port access. (Patient not taking: Reported on 02/20/2022) 30 g 3  ? ?No current facility-administered medications for this visit.  ? ?Facility-Administered Medications Ordered in Other Visits  ?Medication Dose Route Frequency Provider Last Rate Last Admin  ? atezolizumab (TECENTRIQ) 1,200 mg in sodium chloride 0.9 % 250 mL chemo infusion  1,200  mg Intravenous Once Cammie Sickle, MD      ? CARBOplatin (PARAPLATIN) 570 mg in sodium chloride 0.9 % 250 mL chemo infusion  570 mg Intravenous Once Cammie Sickle, MD      ? dexamethasone (DECADRON) 10 mg in sodium chloride 0.9 % 50 mL IVPB  10 mg Intravenous Once Cammie Sickle, MD      ? etoposide (VEPESID) 180 mg in sodium chloride 0.9 % 500 mL chemo infusion  100 mg/m2 (Treatment Plan Recorded) Intravenous Once Cammie Sickle, MD      ? fosaprepitant (EMEND) 150 mg in sodium chloride 0.9 % 145 mL IVPB  150 mg Intravenous Once Charlaine Dalton R, MD      ? palonosetron (ALOXI) injection 0.25 mg  0.25 mg Intravenous Once Cammie Sickle, MD      ? ? ?  ?. ? ?PHYSICAL  EXAMINATION: ?ECOG PERFORMANCE STATUS: 1 - Symptomatic but completely ambulatory ? ?Vitals:  ? 02/20/22 0900  ?BP: (!) 150/92  ?Pulse: 79  ?Resp: 18  ?Temp: (!) 97.4 ?F (36.3 ?C)  ?SpO2: 97%  ? ?Filed Weights  ? 05/

## 2022-02-20 NOTE — Patient Instructions (Signed)
[facility] CANCER CTR AT [place]  Discharge Instructions: ?Thank you for choosing [facility] to provide your oncology and hematology care.  ?If you have a lab appointment with the [facility], please go directly to the [facility] and check in at the registration area. ? ?Wear comfortable clothing and clothing appropriate for easy access to any Portacath or PICC line.  ? ?We strive to give you quality time with your provider. You may need to reschedule your appointment if you arrive late (15 or more minutes).  Arriving late affects you and other patients whose appointments are after yours.  Also, if you miss three or more appointments without notifying the office, you may be dismissed from the clinic at the provider?s discretion.    ?  ?For prescription refill requests, have your pharmacy contact our office and allow 72 hours for refills to be completed.   ? ?Today you received the following chemotherapy and/or immunotherapy agents Tecentriq, Etoposide, and Carboplatin.    ?  ?To help prevent nausea and vomiting after your treatment, we encourage you to take your nausea medication as directed. ? ?BELOW ARE SYMPTOMS THAT SHOULD BE REPORTED IMMEDIATELY: ?*FEVER GREATER THAN 100.4 F (38 ?C) OR HIGHER ?*CHILLS OR SWEATING ?*NAUSEA AND VOMITING THAT IS NOT CONTROLLED WITH YOUR NAUSEA MEDICATION ?*UNUSUAL SHORTNESS OF BREATH ?*UNUSUAL BRUISING OR BLEEDING ?*URINARY PROBLEMS (pain or burning when urinating, or frequent urination) ?*BOWEL PROBLEMS (unusual diarrhea, constipation, pain near the anus) ?TENDERNESS IN MOUTH AND THROAT WITH OR WITHOUT PRESENCE OF ULCERS (sore throat, sores in mouth, or a toothache) ?UNUSUAL RASH, SWELLING OR PAIN  ?UNUSUAL VAGINAL DISCHARGE OR ITCHING  ? ?Items with * indicate a potential emergency and should be followed up as soon as possible or go to the Emergency Department if any problems should occur. ? ?Please show the CHEMOTHERAPY ALERT CARD or  IMMUNOTHERAPY ALERT CARD at check-in to the Emergency Department and triage nurse. ? ?Should you have questions after your visit or need to cancel or reschedule your appointment, please contact [facility] CANCER [place] AT [place]  [phone] and follow the prompts.  Office hours are 8:00 a.m. to 4:30 p.m. Monday - Friday. Please note that voicemails left after 4:00 p.m. may not be returned until the following business day.  We are closed weekends and major holidays. You have access to a nurse at all times for urgent questions. Please call the main number to the clinic [phone] and follow the prompts. ? ?For any non-urgent questions, you may also contact your provider using MyChart. We now offer e-Visits for anyone 68 and older to request care online for non-urgent symptoms. For details visit [email or web address]. ?  ?Also download the MyChart app! Go to the app store, search "MyChart", open the app, select [place], and log in with your MyChart username and password. ? ?Due to Covid, a mask is required upon entering the hospital/clinic. If you do not have a mask, one will be given to you upon arrival. For doctor visits, patients may have 1 support person aged 52 or older with them. For treatment visits, patients cannot have anyone with them due to current Covid guidelines and our immunocompromised population.  ?

## 2022-02-20 NOTE — Assessment & Plan Note (Addendum)
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 April 2023-brain MRI-4 mm x 2 metastases noted.  Proceed with carbo etoposide-Tecentriq every 3 weeks x 4 cycles; followed by maintenance Tecentriq post chemotherapy.  ? ?#Proceed with cycle #1 of carboplatin etoposide-Tecentriq; labs today reviewed;  acceptable for treatment today-except for elevated LFTs see below.  ? ?# Elevated LFTs-unclear etiology-patient malignancy versus others.  Awaiting hepatitis panel. ? ?# #Brain metastases : April 2023-MRI brain 4 mm x 2 [asymptomatic/screening MRI]-recommend holding off radiation.  Monitor closely with repeat brain MRI scan in 6-8 weeks. ? ?Discussed the potential side effects including but not limited to-increasing fatigue, nausea vomiting, diarrhea, hair loss, sores in the mouth, increase risk of infection and also neuropathy.  I discussed the mechanism of action of immunotherapy. Discussed the potential side effects of immunotherapy including but not limited to diarrhea; skin rash; elevated LFTs/endocrine abnormalities etc ? 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  Recommend Zometa-4 mg IV every 4 to 6 weeks.  Discussed the potential side effects including but not limited to hypocalcemia ONJ.   ? ?# Smoking: Active smoker; follow-up in the process of quitting. ? ?# COPD: Continue inhalers [Dr.Aleskerov]- STABLE.  ? ?# DISPOSITION: ?# chemo today;Zometa; and chemo this week as planned ?# D-4 Ellen Henri ? ?# in 1 week-; NP- labs-cbc/cmp;possible iVFs over 1 hour ? ?# follow up in 3 weeks with MD-  d-1 of chemo; carbo-etop-tecentriq;  d-2 etop; d-3 etop;D-4 Jose Morse - Dr.B ? ?# I reviewed the blood work- with the patient in detail; also reviewed the imaging independently [as summarized above]; and with the patient in detail.  ? ?

## 2022-02-20 NOTE — Addendum Note (Signed)
Addended by: Charlaine Dalton R on: 02/20/2022 10:33 AM ? ? Modules accepted: Orders ? ?

## 2022-02-20 NOTE — Progress Notes (Signed)
Patient has low back and right hip pain that is worsening, 7/10 pain today.  Is taking Tramdol every 6 hours no relief.  Has also taken Tylenol #3 in the past with no relief.  Reports a good appetite but has a 2 lb wt loss.  Does have difficulty sleeping that he relates to his pain. ? ?Patient notified that insurance denied EMLA cream rx and he would have to try and fail OTC Lidocaine first.   ?

## 2022-02-21 ENCOUNTER — Telehealth: Payer: Self-pay

## 2022-02-21 ENCOUNTER — Inpatient Hospital Stay: Payer: BC Managed Care – PPO

## 2022-02-21 ENCOUNTER — Other Ambulatory Visit: Payer: Self-pay | Admitting: Internal Medicine

## 2022-02-21 VITALS — BP 156/84 | HR 60 | Temp 97.0°F | Resp 18

## 2022-02-21 DIAGNOSIS — C3412 Malignant neoplasm of upper lobe, left bronchus or lung: Secondary | ICD-10-CM

## 2022-02-21 DIAGNOSIS — Z5112 Encounter for antineoplastic immunotherapy: Secondary | ICD-10-CM | POA: Diagnosis not present

## 2022-02-21 MED ORDER — ZOLEDRONIC ACID 4 MG/100ML IV SOLN
4.0000 mg | Freq: Once | INTRAVENOUS | Status: AC
  Administered 2022-02-21: 4 mg via INTRAVENOUS
  Filled 2022-02-21: qty 100

## 2022-02-21 MED ORDER — SODIUM CHLORIDE 0.9 % IV SOLN
Freq: Once | INTRAVENOUS | Status: AC
  Filled 2022-02-21: qty 250

## 2022-02-21 MED ORDER — SODIUM CHLORIDE 0.9 % IV SOLN
10.0000 mg | Freq: Once | INTRAVENOUS | Status: AC
  Administered 2022-02-21: 10 mg via INTRAVENOUS
  Filled 2022-02-21: qty 10

## 2022-02-21 MED ORDER — SODIUM CHLORIDE 0.9 % IV SOLN
100.0000 mg/m2 | Freq: Once | INTRAVENOUS | Status: AC
  Administered 2022-02-21: 180 mg via INTRAVENOUS
  Filled 2022-02-21: qty 9

## 2022-02-21 NOTE — Patient Instructions (Signed)
[facility] CANCER CTR AT [place]  Discharge Instructions: ?Thank you for choosing [facility] to provide your oncology and hematology care.  ?If you have a lab appointment with the [facility], please go directly to the [facility] and check in at the registration area. ? ?Wear comfortable clothing and clothing appropriate for easy access to any Portacath or PICC line.  ? ?We strive to give you quality time with your provider. You may need to reschedule your appointment if you arrive late (15 or more minutes).  Arriving late affects you and other patients whose appointments are after yours.  Also, if you miss three or more appointments without notifying the office, you may be dismissed from the clinic at the provider?s discretion.    ?  ?For prescription refill requests, have your pharmacy contact our office and allow 72 hours for refills to be completed.   ? ?Today you received the following chemotherapy and/or immunotherapy agents Etoposide    ?  ?To help prevent nausea and vomiting after your treatment, we encourage you to take your nausea medication as directed. ? ?BELOW ARE SYMPTOMS THAT SHOULD BE REPORTED IMMEDIATELY: ?*FEVER GREATER THAN 100.4 F (38 ?C) OR HIGHER ?*CHILLS OR SWEATING ?*NAUSEA AND VOMITING THAT IS NOT CONTROLLED WITH YOUR NAUSEA MEDICATION ?*UNUSUAL SHORTNESS OF BREATH ?*UNUSUAL BRUISING OR BLEEDING ?*URINARY PROBLEMS (pain or burning when urinating, or frequent urination) ?*BOWEL PROBLEMS (unusual diarrhea, constipation, pain near the anus) ?TENDERNESS IN MOUTH AND THROAT WITH OR WITHOUT PRESENCE OF ULCERS (sore throat, sores in mouth, or a toothache) ?UNUSUAL RASH, SWELLING OR PAIN  ?UNUSUAL VAGINAL DISCHARGE OR ITCHING  ? ?Items with * indicate a potential emergency and should be followed up as soon as possible or go to the Emergency Department if any problems should occur. ? ?Please show the CHEMOTHERAPY ALERT CARD or IMMUNOTHERAPY ALERT CARD at check-in to  the Emergency Department and triage nurse. ? ?Should you have questions after your visit or need to cancel or reschedule your appointment, please contact [facility] CANCER [place] AT [place]  [phone] and follow the prompts.  Office hours are 8:00 a.m. to 4:30 p.m. Monday - Friday. Please note that voicemails left after 4:00 p.m. may not be returned until the following business day.  We are closed weekends and major holidays. You have access to a nurse at all times for urgent questions. Please call the main number to the clinic [phone] and follow the prompts. ? ?For any non-urgent questions, you may also contact your provider using MyChart. We now offer e-Visits for anyone 59 and older to request care online for non-urgent symptoms. For details visit [email or web address] ?  ?Also download the MyChart app! Go to the app store, search "MyChart", open the app, select [place], and log in with your MyChart username and password. ? ?Due to Covid, a mask is required upon entering the hospital/clinic. If you do not have a mask, one will be given to you upon arrival. For doctor visits, patients may have 1 support person aged 73 or older with them. For treatment visits, patients cannot have anyone with them due to current Covid guidelines and our immunocompromised population.  ?

## 2022-02-21 NOTE — Telephone Encounter (Signed)
Telephone call to patient for follow up after receiving first infusion.   No answer but left message stating we were calling to check on them.  Encouraged patient to call for any questions or concerns.   

## 2022-02-22 ENCOUNTER — Inpatient Hospital Stay: Payer: BC Managed Care – PPO

## 2022-02-22 ENCOUNTER — Other Ambulatory Visit: Payer: BC Managed Care – PPO

## 2022-02-22 ENCOUNTER — Ambulatory Visit: Payer: BC Managed Care – PPO | Admitting: Internal Medicine

## 2022-02-22 ENCOUNTER — Other Ambulatory Visit: Payer: Self-pay | Admitting: Student

## 2022-02-22 VITALS — BP 153/82 | HR 64 | Temp 96.7°F | Resp 18

## 2022-02-22 DIAGNOSIS — C3412 Malignant neoplasm of upper lobe, left bronchus or lung: Secondary | ICD-10-CM

## 2022-02-22 DIAGNOSIS — Z5112 Encounter for antineoplastic immunotherapy: Secondary | ICD-10-CM | POA: Diagnosis not present

## 2022-02-22 MED ORDER — SODIUM CHLORIDE 0.9 % IV SOLN
Freq: Once | INTRAVENOUS | Status: AC
  Filled 2022-02-22: qty 250

## 2022-02-22 MED ORDER — SODIUM CHLORIDE 0.9 % IV SOLN
100.0000 mg/m2 | Freq: Once | INTRAVENOUS | Status: AC
  Administered 2022-02-22: 180 mg via INTRAVENOUS
  Filled 2022-02-22: qty 9

## 2022-02-22 MED ORDER — SODIUM CHLORIDE 0.9 % IV SOLN
10.0000 mg | Freq: Once | INTRAVENOUS | Status: AC
  Administered 2022-02-22: 10 mg via INTRAVENOUS
  Filled 2022-02-22: qty 10

## 2022-02-22 NOTE — Patient Instructions (Signed)
[facility] CANCER CTR AT [place]  Discharge Instructions: ?Thank you for choosing [facility] to provide your oncology and hematology care.  ?If you have a lab appointment with the [facility], please go directly to the [facility] and check in at the registration area. ? ?Wear comfortable clothing and clothing appropriate for easy access to any Portacath or PICC line.  ? ?We strive to give you quality time with your provider. You may need to reschedule your appointment if you arrive late (15 or more minutes).  Arriving late affects you and other patients whose appointments are after yours.  Also, if you miss three or more appointments without notifying the office, you may be dismissed from the clinic at the provider?s discretion.    ?  ?For prescription refill requests, have your pharmacy contact our office and allow 72 hours for refills to be completed.   ? ?Today you received the following chemotherapy and/or immunotherapy agents etoposide   ?  ?To help prevent nausea and vomiting after your treatment, we encourage you to take your nausea medication as directed. ? ?BELOW ARE SYMPTOMS THAT SHOULD BE REPORTED IMMEDIATELY: ?*FEVER GREATER THAN 100.4 F (38 ?C) OR HIGHER ?*CHILLS OR SWEATING ?*NAUSEA AND VOMITING THAT IS NOT CONTROLLED WITH YOUR NAUSEA MEDICATION ?*UNUSUAL SHORTNESS OF BREATH ?*UNUSUAL BRUISING OR BLEEDING ?*URINARY PROBLEMS (pain or burning when urinating, or frequent urination) ?*BOWEL PROBLEMS (unusual diarrhea, constipation, pain near the anus) ?TENDERNESS IN MOUTH AND THROAT WITH OR WITHOUT PRESENCE OF ULCERS (sore throat, sores in mouth, or a toothache) ?UNUSUAL RASH, SWELLING OR PAIN  ?UNUSUAL VAGINAL DISCHARGE OR ITCHING  ? ?Items with * indicate a potential emergency and should be followed up as soon as possible or go to the Emergency Department if any problems should occur. ? ?Please show the CHEMOTHERAPY ALERT CARD or IMMUNOTHERAPY ALERT CARD at check-in to  the Emergency Department and triage nurse. ? ?Should you have questions after your visit or need to cancel or reschedule your appointment, please contact [facility] CANCER [place] AT [place]  [phone] and follow the prompts.  Office hours are 8:00 a.m. to 4:30 p.m. Monday - Friday. Please note that voicemails left after 4:00 p.m. may not be returned until the following business day.  We are closed weekends and major holidays. You have access to a nurse at all times for urgent questions. Please call the main number to the clinic [phone] and follow the prompts. ? ?For any non-urgent questions, you may also contact your provider using MyChart. We now offer e-Visits for anyone 54 and older to request care online for non-urgent symptoms. For details visit [email or web address] ?  ?Also download the MyChart app! Go to the app store, search "MyChart", open the app, select [place], and log in with your MyChart username and password. ? ?Due to Covid, a mask is required upon entering the hospital/clinic. If you do not have a mask, one will be given to you upon arrival. For doctor visits, patients may have 1 support person aged 23 or older with them. For treatment visits, patients cannot have anyone with them due to current Covid guidelines and our immunocompromised population.  ?

## 2022-02-23 ENCOUNTER — Encounter: Payer: Self-pay | Admitting: Radiology

## 2022-02-23 ENCOUNTER — Other Ambulatory Visit: Payer: BC Managed Care – PPO

## 2022-02-23 ENCOUNTER — Inpatient Hospital Stay: Payer: BC Managed Care – PPO

## 2022-02-23 ENCOUNTER — Ambulatory Visit
Admission: RE | Admit: 2022-02-23 | Discharge: 2022-02-23 | Disposition: A | Discharge status: Home / Self Care | Payer: BC Managed Care – PPO | Source: Ambulatory Visit | Attending: Internal Medicine | Admitting: Internal Medicine

## 2022-02-23 DIAGNOSIS — F17210 Nicotine dependence, cigarettes, uncomplicated: Secondary | ICD-10-CM | POA: Insufficient documentation

## 2022-02-23 DIAGNOSIS — C3490 Malignant neoplasm of unspecified part of unspecified bronchus or lung: Secondary | ICD-10-CM

## 2022-02-23 DIAGNOSIS — E785 Hyperlipidemia, unspecified: Secondary | ICD-10-CM | POA: Insufficient documentation

## 2022-02-23 DIAGNOSIS — C3412 Malignant neoplasm of upper lobe, left bronchus or lung: Secondary | ICD-10-CM

## 2022-02-23 DIAGNOSIS — Z5112 Encounter for antineoplastic immunotherapy: Secondary | ICD-10-CM | POA: Diagnosis not present

## 2022-02-23 HISTORY — PX: IR IMAGING GUIDED PORT INSERTION: IMG5740

## 2022-02-23 MED ORDER — HEPARIN SOD (PORK) LOCK FLUSH 100 UNIT/ML IV SOLN
INTRAVENOUS | Status: AC
  Administered 2022-02-23: 500 [IU]
  Filled 2022-02-23: qty 5

## 2022-02-23 MED ORDER — FENTANYL CITRATE (PF) 100 MCG/2ML IJ SOLN
INTRAMUSCULAR | Status: AC
  Filled 2022-02-23: qty 2

## 2022-02-23 MED ORDER — MIDAZOLAM HCL 5 MG/5ML IJ SOLN
INTRAMUSCULAR | Status: AC | PRN
  Administered 2022-02-23: 1 mg via INTRAVENOUS

## 2022-02-23 MED ORDER — LIDOCAINE-EPINEPHRINE 1 %-1:100000 IJ SOLN
INTRAMUSCULAR | Status: AC
  Administered 2022-02-23: 12 mL
  Filled 2022-02-23: qty 1

## 2022-02-23 MED ORDER — FENTANYL CITRATE (PF) 100 MCG/2ML IJ SOLN
INTRAMUSCULAR | Status: AC | PRN
  Administered 2022-02-23 (×2): 50 ug via INTRAVENOUS

## 2022-02-23 MED ORDER — SODIUM CHLORIDE 0.9 % IV SOLN
INTRAVENOUS | Status: DC
  Administered 2022-02-23: 1000 mL via INTRAVENOUS
  Filled 2022-02-23: qty 1000

## 2022-02-23 MED ORDER — MIDAZOLAM HCL 2 MG/2ML IJ SOLN
INTRAMUSCULAR | Status: AC | PRN
  Administered 2022-02-23: 1 mg via INTRAVENOUS

## 2022-02-23 MED ORDER — MIDAZOLAM HCL 2 MG/2ML IJ SOLN
INTRAMUSCULAR | Status: AC
  Filled 2022-02-23: qty 2

## 2022-02-23 MED ORDER — PEGFILGRASTIM-CBQV 6 MG/0.6ML ~~LOC~~ SOSY
6.0000 mg | PREFILLED_SYRINGE | Freq: Once | SUBCUTANEOUS | Status: AC
  Administered 2022-02-23: 6 mg via SUBCUTANEOUS
  Filled 2022-02-23: qty 0.6

## 2022-02-23 NOTE — H&P (Addendum)
? ?Chief Complaint: Patient was seen in consultation today for port placement. ? ?Referring Physician(s): Brahmanday,Govinda R ? ?Supervising Physician: Juliet Rude ? ?Patient Status: [place] ? ?History of Present Illness: ?Jose Morse is a 51 y.o. 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 Jose Morse presented to the ED on 01/03/22 with complaints of left sided chest pain and was found to have a left hilar.mediastinal mass and adenopathy with probably left upper lobe postobstructive pneumonia. He underwent bronchoscopy on 02/10/22 and pathology showed small cell carcinoma. He was referred to medical oncology and has received 3 systemic treatments so far and is planned to undergo further systemic chemotherapy. He is not planned for radiation of the chest at this time. He presents to IR today for port placement for durable venous access.  ? ?Past Medical History:  ?Diagnosis Date  ? History of kidney stones   ? Hypercholesteremia   ? ? ?Past Surgical History:  ?Procedure Laterality Date  ? CHOLECYSTECTOMY    ? VIDEO BRONCHOSCOPY WITH ENDOBRONCHIAL NAVIGATION N/A 02/10/2022  ? Procedure: VIDEO BRONCHOSCOPY WITH ENDOBRONCHIAL NAVIGATION;  Surgeon: Ottie Glazier, MD;  Location: [facility] ORS;  Service: Thoracic;  Laterality: N/A;  ? VIDEO BRONCHOSCOPY WITH ENDOBRONCHIAL ULTRASOUND N/A 02/10/2022  ? Procedure: VIDEO BRONCHOSCOPY WITH ENDOBRONCHIAL ULTRASOUND;  Surgeon: Ottie Glazier, MD;  Location: [facility] ORS;  Service: Thoracic;  Laterality: N/A;  ? ? ?Allergies: ?Codeine ? ?Medications: ?Prior to Admission medications   ?Medication Sig Start Date End Date Taking? Authorizing Provider  ?acetaminophen-codeine (TYLENOL #3) 300-30 MG tablet Take 1 tablet by mouth every 4 (four) hours as needed for moderate pain. ?Patient not taking: Reported on 02/20/2022    [provider]  ?albuterol  (PROVENTIL HFA) 108 (90 Base) MCG/ACT inhaler Inhale 2 puffs into the lungs every 4 (four) hours as needed for wheezing or shortness of breath. 12/28/21   Carrie Mew, MD  ?Budeson-Glycopyrrol-Formoterol (BREZTRI AEROSPHERE) 160-9-4.8 MCG/ACT AERO Inhale 2 puffs into the lungs in the morning and at bedtime.    [provider]  ?lidocaine-prilocaine (EMLA) cream Apply on the port. 30 -45 min  prior to port access. ?Patient not taking: Reported on 02/20/2022 02/16/22   Cammie Sickle, MD  ?ondansetron [facility]) 8 MG tablet One pill every 8 hours as needed for nausea/vomitting. 02/16/22   Cammie Sickle, MD  ?oxyCODONE-acetaminophen (PERCOCET/ROXICET) 5-325 MG tablet Take 1 tablet by mouth every 6 (six) hours as needed for severe pain. 02/20/22   Cammie Sickle, MD  ?prochlorperazine (COMPAZINE) 10 MG tablet Take 1 tablet (10 mg total) by mouth every 6 (six) hours as needed for nausea or vomiting. 02/16/22   Cammie Sickle, MD  ?rosuvastatin (CRESTOR) 5 MG tablet Take 5 mg by mouth at bedtime.    [provider]  ?traMADol (ULTRAM) 50 MG tablet Take 1 tablet (50 mg total) by mouth every 6 (six) hours as needed. 02/16/22   Cammie Sickle, MD  ?  ? ?Family History  ?Problem Relation Age of Onset  ? Ovarian cancer Mother   ? Colon cancer Father   ? Diabetes Father   ? ? ?Social History  ? ?Socioeconomic History  ? Marital status: Married  ?  Spouse name: Not on file  ? Number of children: Not on file  ? Years of education: Not on file  ? Highest education level: Not on file  ?  Occupational History  ? Not on file  ?Tobacco Use  ? Smoking status: Every Day  ?  Packs/day: 0.50  ?  Years: 34.00  ?  Pack years: 17.00  ?  Types: Cigarettes  ? Smokeless tobacco: Never  ?Vaping Use  ? Vaping Use: Never used  ?Substance and Sexual Activity  ? Alcohol use: No  ? Drug use: Not Currently  ?  Types: Marijuana  ? Sexual activity: Yes  ?Other Topics Concern  ? Not on file  ?Social History  Narrative  ? 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 Smoker; no alcohol.   ? ?Social Determinants of Health  ? ?Financial Resource Strain: Not on file  ?Food Insecurity: Not on file  ?Transportation Needs: Not on file  ?Physical Activity: Not on file  ?Stress: Not on file  ?Social Connections: Not on file  ? ? ? ?Review of Systems: A 12 point ROS discussed and pertinent positives are indicated in the HPI above.  All other systems are negative. ? ?Review of Systems  ?Constitutional:  Negative for chills and fever.  ?Respiratory:  Negative for cough and shortness of breath.   ?Cardiovascular:  Negative for chest pain.  ?Gastrointestinal:  Negative for abdominal pain, diarrhea, nausea and vomiting.  ?Musculoskeletal:  Positive for back pain.  ?Neurological:  Negative for dizziness and headaches.  ? ?Vital Signs: ?BP (!) 146/84   Pulse 64   Temp 97.9 ?F (36.6 ?C) (Oral)   Resp 12   Ht 5\' 7"  (1.702 m)   Wt 146 lb (66.2 kg)   SpO2 97%   BMI 22.87 kg/m?  ? ?Physical Exam ?Vitals reviewed.  ?Constitutional:   ?   General: He is not in acute distress. ?HENT:  ?   Head: Normocephalic.  ?   Mouth/Throat:  ?   Mouth: Mucous membranes are moist.  ?   Pharynx: Oropharynx is clear. No oropharyngeal exudate or posterior oropharyngeal erythema.  ?Cardiovascular:  ?   Rate and Rhythm: Normal rate and regular rhythm.  ?Pulmonary:  ?   Effort: Pulmonary effort is normal.  ?   Breath sounds: Normal breath sounds.  ?Abdominal:  ?   General: There is no distension.  ?   Palpations: Abdomen is soft.  ?   Tenderness: There is no abdominal tenderness.  ?Skin: ?   General: Skin is warm and dry.  ?Neurological:  ?   Mental Status: He is alert and oriented to person, place, and time.  ?Psychiatric:     ?   Mood and Affect: Mood normal.     ?   Behavior: Behavior normal.     ?   Thought Content: Thought content normal.     ?   Judgment: Judgment normal.  ? ? ? ?MD Evaluation ?Airway: WNL ?Heart: WNL ?Abdomen:  WNL ?Chest/ Lungs: WNL ?ASA  Classification: 2 ?Mallampati/Airway Score: Two ? ? ?Imaging: ?CT CHEST WO CONTRAST ? ?Result Date: 02/11/2022 ?CLINICAL DATA:  Left lung mass, preprocedure evaluation before biopsy EXAM: CT CHEST WITHOUT CONTRAST TECHNIQUE: Multidetector CT imaging of the chest was performed following the standard protocol without IV contrast. RADIATION DOSE REDUCTION: This exam was performed according to the departmental dose-optimization program which includes automated exposure control, adjustment of the mA and/or kV according to patient size and/or use of iterative reconstruction technique. COMPARISON:  02/06/2022, 01/03/2022 FINDINGS: Cardiovascular: Unenhanced imaging of the heart and great vessels demonstrates no pericardial effusion. Normal caliber of the thoracic aorta. Mediastinum/Nodes: The left  hilar mass seen previously is again identified, margins obscured with the lack of IV contrast. Mass measures approximately 5.3 x 3.4 cm reference image 23/2, consistent with metabolically active mass on recent PET scan. There is significant narrowing of the left upper lobe bronchus, with occlusion of segmental left upper lobe bronchi as before. There is an enlarged right axillary lymph node measuring 12 x 27 mm image 58/3, metabolically active on recent PET scan. Stable enlarged left hilar adenopathy, measuring up to 14 mm reference image 09/4, also metabolically active on recent PET scan. Thyroid, trachea, and esophagus are otherwise unremarkable. Lungs/Pleura: Postobstructive changes are again seen within the left upper lobe related to the central obstructing mass. No other areas of consolidation, effusion, or pneumothorax. Upper Abdomen: Bilateral adrenal thickening and hypodense left lobe liver mass again identified, consistent with subdiaphragmatic metastatic disease based on metabolic activity on recent PET scan. Musculoskeletal: The skeletal metastases seen on recent PET scan are occult by CT.  No acute fractures. Reconstructed images demonstrate no additional findings. IMPRESSION: 1. Stable left hilar mass consistent with metabolically active abnormality on recent PET scan, consistent with malignancy. 2

## 2022-02-23 NOTE — Procedures (Signed)
Interventional Radiology Procedure Note ? ?Date of Procedure: 02/23/2022  ?Procedure: Port placement  ? ?Findings:  ?1. Port placement, right chest via right IJ   ? ?Complications: No immediate complications noted.  ? ?Estimated Blood Loss: minimal ? ?Follow-up and Recommendations: ?1. Bedrest 1 hour  ? ? ?Albin Felling, MD  ?Vascular & Interventional Radiology  ?02/23/2022 10:55 AM ? ? ? ?

## 2022-02-23 NOTE — Progress Notes (Signed)
Tumor Board Documentation [name] was presented by Dr Rogue Bussing at our Tumor Board on 02/23/2022, which included representatives from medical oncology, surgical, pharmacy, pulmonology, genetics, radiology, pathology, radiation oncology, navigation, research, palliative care, internal medicine. ? ?Jose Morse currently presents as a new patient, for [place], for new positive pathology with history of the following treatments: surgical intervention(s), active survellience. ? ?Additionally, we reviewed previous medical and familial history, history of present illness, and recent lab results along with all available histopathologic and imaging studies. The tumor board considered available treatment options and made the following recommendations: ?Chemotherapy, Immunotherapy, Active surveillance (repeat scan in 3 months and if progression, offer XRT) ?  ? ?The following procedures/referrals were also placed: No orders of the defined types were placed in this encounter. ? ? ?Clinical Trial Status: not discussed  ? ?Staging used: AJCC Stage Group ?AJCC Staging: ?T: 3 ?N: 2 ?M: 1c ?Group: Stage IV B Small Cell LUL Carcinoma ? ? ?National site-specific guidelines NCCN were discussed with respect to the case. ? ?Tumor board is a meeting of clinicians from various specialty areas who evaluate and discuss patients for whom a multidisciplinary approach is being considered. Final determinations in the plan of care are those of the provider(s). The responsibility for follow up of recommendations given during tumor board is that of the provider.  ? ?Today?s extended care, comprehensive team conference, Jose Morse was not present for the discussion and was not examined.  ? ?Multidisciplinary Tumor Board is a multidisciplinary case peer review process.  Decisions discussed in the Multidisciplinary Tumor Board reflect the opinions of the specialists present at the conference without having examined the patient.  Ultimately, treatment  and diagnostic decisions rest with the primary provider(s) and the patient. ? ?

## 2022-02-24 ENCOUNTER — Ambulatory Visit: Payer: BC Managed Care – PPO

## 2022-02-27 ENCOUNTER — Inpatient Hospital Stay: Payer: BC Managed Care – PPO

## 2022-02-27 ENCOUNTER — Inpatient Hospital Stay (HOSPITAL_BASED_OUTPATIENT_CLINIC_OR_DEPARTMENT_OTHER): Payer: BC Managed Care – PPO | Admitting: Nurse Practitioner

## 2022-02-27 ENCOUNTER — Encounter: Payer: Self-pay | Admitting: Nurse Practitioner

## 2022-02-27 ENCOUNTER — Encounter: Payer: Self-pay | Admitting: *Deleted

## 2022-02-27 VITALS — BP 148/90 | HR 77 | Temp 98.7°F | Resp 19 | Wt 141.2 lb

## 2022-02-27 DIAGNOSIS — R439 Unspecified disturbances of smell and taste: Secondary | ICD-10-CM

## 2022-02-27 DIAGNOSIS — C3412 Malignant neoplasm of upper lobe, left bronchus or lung: Secondary | ICD-10-CM

## 2022-02-27 DIAGNOSIS — Z95828 Presence of other vascular implants and grafts: Secondary | ICD-10-CM

## 2022-02-27 DIAGNOSIS — Z5112 Encounter for antineoplastic immunotherapy: Secondary | ICD-10-CM | POA: Diagnosis not present

## 2022-02-27 LAB — CBC WITH DIFFERENTIAL/PLATELET
Abs Immature Granulocytes: 0.01 10*3/uL (ref 0.00–0.07)
Basophils Absolute: 0.1 10*3/uL (ref 0.0–0.1)
Basophils Relative: 2 %
Eosinophils Absolute: 0.2 10*3/uL (ref 0.0–0.5)
Eosinophils Relative: 6 %
HCT: 41.6 % (ref 39.0–52.0)
Hemoglobin: 14.2 g/dL (ref 13.0–17.0)
Immature Granulocytes: 0 %
Lymphocytes Relative: 38 %
Lymphs Abs: 1 10*3/uL (ref 0.7–4.0)
MCH: 30 pg (ref 26.0–34.0)
MCHC: 34.1 g/dL (ref 30.0–36.0)
MCV: 87.8 fL (ref 80.0–100.0)
Monocytes Absolute: 0.2 10*3/uL (ref 0.1–1.0)
Monocytes Relative: 7 %
Neutro Abs: 1.2 10*3/uL — ABNORMAL LOW (ref 1.7–7.7)
Neutrophils Relative %: 47 %
Platelets: 101 10*3/uL — ABNORMAL LOW (ref 150–400)
RBC: 4.74 MIL/uL (ref 4.22–5.81)
RDW: 13.1 % (ref 11.5–15.5)
Smear Review: NORMAL
WBC: 2.6 10*3/uL — ABNORMAL LOW (ref 4.0–10.5)
nRBC: 0 % (ref 0.0–0.2)

## 2022-02-27 LAB — COMPREHENSIVE METABOLIC PANEL
ALT: 34 U/L (ref 0–44)
AST: 29 U/L (ref 15–41)
Albumin: 3.5 g/dL (ref 3.5–5.0)
Alkaline Phosphatase: 147 U/L — ABNORMAL HIGH (ref 38–126)
Anion gap: 12 (ref 5–15)
BUN: 15 mg/dL (ref 6–20)
CO2: 21 mmol/L — ABNORMAL LOW (ref 22–32)
Calcium: 8.9 mg/dL (ref 8.9–10.3)
Chloride: 99 mmol/L (ref 98–111)
Creatinine, Ser: 0.86 mg/dL (ref 0.61–1.24)
GFR, Estimated: >60 mL/min (ref >60)
Glucose, Bld: 128 mg/dL — ABNORMAL HIGH (ref 70–99)
Potassium: 3.4 mmol/L — ABNORMAL LOW (ref 3.5–5.1)
Sodium: 132 mmol/L — ABNORMAL LOW (ref 135–145)
Total Bilirubin: 1 mg/dL (ref 0.3–1.2)
Total Protein: 7.6 g/dL (ref 6.5–8.1)

## 2022-02-27 MED ORDER — SODIUM CHLORIDE 0.9% FLUSH
10.0000 mL | Freq: Once | INTRAVENOUS | Status: AC
  Administered 2022-02-27: 10 mL via INTRAVENOUS
  Filled 2022-02-27: qty 10

## 2022-02-27 MED ORDER — HEPARIN SOD (PORK) LOCK FLUSH 100 UNIT/ML IV SOLN
500.0000 [IU] | Freq: Once | INTRAVENOUS | Status: AC
  Administered 2022-02-27: 500 [IU] via INTRAVENOUS
  Filled 2022-02-27: qty 5

## 2022-02-27 MED ORDER — SODIUM CHLORIDE 0.9 % IV SOLN
INTRAVENOUS | Status: AC
  Filled 2022-02-27 (×2): qty 250

## 2022-02-27 NOTE — Progress Notes (Addendum)
[facility] ?CONSULT NOTE ? ?Patient Care Team: ?Tracie Harrier, MD as PCP - General (Internal Medicine) ?Telford Nab, RN as Sales executive ? ?CHIEF COMPLAINTS/PURPOSE OF CONSULTATION: lung cancer ? ?#  ?Oncology History Overview Note  ?# . LUNG, LEFT UPPER LOBE; ENB-GUIDED TRANSBRONCHIAL FORCEPS BIOPSY: - SMALL CELL CARCINOMA.  [Dr.Aleskerov] ? ?Comment:  ?Invasive carcinoma is present in two fragments, with crush artifact. The  ?cells are small, with scant cytoplasm, nuclear molding, and  ?inconspicuous nucleoli. There is necrosis.  ? ?Immunohistochemistry (IHC) was performed for further characterization.  ?The neoplastic cells are positive for CD56 with strong staining of over  ?90% of cells. They are negative for TTF-1 and p40.  ? ? ?IMPRESSION: ?1. LEFT suprahilar mass constricting LEFT upper lobe bronchus ?consistent with primary bronchogenic carcinoma. ?2. Ipsilateral mediastinal nodal metastasis. ?3. Distant visceral metastasis to the LIVER and bilateral ADRENAL ?GLANDS. ?4. Distant nodal metastasis to the LEFT external iliac node. ?5. Multifocal hypermetabolic skeletal metastasis (approximately 40 ?lesions). Lesions are occult by CT imaging. ?6. Focal activity beneath the skin posterior to the RIGHT ear. ?Differential includes nodal metastasis versus primary parotid ?neoplasm. Favor nodal metastasis. (Recommend attention on brain MRI ?workup). ?  ?  ?Electronically Signed ?  By: Suzy Bouchard M.D. ?  On: 02/06/2022 13:00 ? ?# April 2023-brain MRI incidental/screening 4 mm x 2 cerebellar lesions. ? ?# MAY 1st, 2023- carboo-Etop [d-1-3]; udenyca ?  ?Primary cancer of left upper lobe of lung ([place])  ?02/16/2022 Initial Diagnosis  ? Primary cancer of left upper lobe of lung ([place]) ?  ?02/16/2022 Cancer Staging  ? Staging form: Lung, AJCC 8th Edition ?- Clinical: Stage IVB (cT3, cN2, pM1c) - Signed by Cammie Sickle, MD on 02/16/2022 ?Stage prefix: Initial diagnosis ? ?   ?02/20/2022 -  Chemotherapy  ? Patient is on Treatment Plan : LUNG SCLC Carboplatin + Etoposide + Atezolizumab Induction q21d / Atezolizumab Maintenance q21d  ? ?  ?  ? ? ? ?HISTORY OF PRESENTING ILLNESS: Ambulating independently. Alone.  ? ?ALANMICHAEL BARMORE 51 y.o.  male metastatic stage IV/extensive stage small cell lung cancer is here for assessment after chemotherapy last week. He had nausea for a couple of days controlled with oral antiemetics. Endorses taste changes, described as lingering medication taste. Denies pain or sores. No difficulty swallowing. Eating and drinking well. Reports minimal pain and rarely takes percocet. Denies constipation. No diarrhea. Fatigue improving. Feels like he tolerated chemo well.  ? ? ?Review of Systems  ?Constitutional:  Positive for malaise/fatigue and weight loss. Negative for chills and fever.  ?HENT:  Negative for hearing loss, nosebleeds, sore throat and tinnitus.   ?     Per hpi  ?Eyes:  Negative for blurred vision and double vision.  ?Respiratory:  Negative for cough, hemoptysis, shortness of breath and wheezing.   ?Cardiovascular:  Negative for chest pain, palpitations and leg swelling.  ?Gastrointestinal:  Negative for abdominal pain, blood in stool, constipation, diarrhea, melena, nausea and vomiting.  ?Genitourinary:  Negative for dysuria and urgency.  ?Musculoskeletal:  Negative for back pain, falls, joint pain and myalgias.  ?Skin:  Negative for itching and rash.  ?Neurological:  Negative for dizziness, tingling, sensory change, loss of consciousness, weakness and headaches.  ?Endo/Heme/Allergies:  Negative for environmental allergies. Does not bruise/bleed easily.  ?Psychiatric/Behavioral:  Negative for depression. The patient is not nervous/anxious and does not have insomnia.    ? ?MEDICAL HISTORY:  ?Past Medical History:  ?Diagnosis Date  ? History  of kidney stones   ? Hypercholesteremia   ? ? ?SURGICAL HISTORY: ?Past Surgical History:  ?Procedure Laterality  Date  ? CHOLECYSTECTOMY    ? IR IMAGING GUIDED PORT INSERTION  02/23/2022  ? VIDEO BRONCHOSCOPY WITH ENDOBRONCHIAL NAVIGATION N/A 02/10/2022  ? Procedure: VIDEO BRONCHOSCOPY WITH ENDOBRONCHIAL NAVIGATION;  Surgeon: Ottie Glazier, MD;  Location: [facility] ORS;  Service: Thoracic;  Laterality: N/A;  ? VIDEO BRONCHOSCOPY WITH ENDOBRONCHIAL ULTRASOUND N/A 02/10/2022  ? Procedure: VIDEO BRONCHOSCOPY WITH ENDOBRONCHIAL ULTRASOUND;  Surgeon: Ottie Glazier, MD;  Location: [facility] ORS;  Service: Thoracic;  Laterality: N/A;  ? ? ?SOCIAL HISTORY: ?Social History  ? ?Socioeconomic History  ? Marital status: Married  ?  Spouse name: Not on file  ? Number of children: Not on file  ? Years of education: Not on file  ? Highest education level: Not on file  ?Occupational History  ? Not on file  ?Tobacco Use  ? Smoking status: Every Day  ?  Packs/day: 0.50  ?  Years: 34.00  ?  Pack years: 17.00  ?  Types: Cigarettes  ? Smokeless tobacco: Never  ?Vaping Use  ? Vaping Use: Never used  ?Substance and Sexual Activity  ? Alcohol use: No  ? Drug use: Not Currently  ?  Types: Marijuana  ? Sexual activity: Yes  ?Other Topics Concern  ? Not on file  ?Social History Narrative  ? 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 Smoker; no alcohol.   ? ?Social Determinants of Health  ? ?Financial Resource Strain: Not on file  ?Food Insecurity: Not on file  ?Transportation Needs: Not on file  ?Physical Activity: Not on file  ?Stress: Not on file  ?Social Connections: Not on file  ?Intimate Partner Violence: Not on file  ? ? ?FAMILY HISTORY: ?Family History  ?Problem Relation Age of Onset  ? Ovarian cancer Mother   ? Colon cancer Father   ? Diabetes Father   ? ? ?ALLERGIES:  is allergic to codeine. ? ?MEDICATIONS:  ?Current Outpatient Medications  ?Medication Sig Dispense Refill  ? albuterol (PROVENTIL HFA) 108 (90 Base) MCG/ACT inhaler Inhale 2 puffs into the lungs every 4 (four) hours as needed for wheezing or shortness of breath. 1  each 0  ? Budeson-Glycopyrrol-Formoterol (BREZTRI AEROSPHERE) 160-9-4.8 MCG/ACT AERO Inhale 2 puffs into the lungs in the morning and at bedtime.    ? ondansetron (ZOFRAN) 8 MG tablet One pill every 8 hours as needed for nausea/vomitting. 40 tablet 1  ? oxyCODONE-acetaminophen (PERCOCET/ROXICET) 5-325 MG tablet Take 1 tablet by mouth every 6 (six) hours as needed for severe pain. 60 tablet 0  ? prochlorperazine (COMPAZINE) 10 MG tablet Take 1 tablet (10 mg total) by mouth every 6 (six) hours as needed for nausea or vomiting. 40 tablet 1  ? rosuvastatin (CRESTOR) 5 MG tablet Take 5 mg by mouth at bedtime.    ? traMADol (ULTRAM) 50 MG tablet Take 1 tablet (50 mg total) by mouth every 6 (six) hours as needed. 60 tablet 0  ? acetaminophen-codeine (TYLENOL #3) 300-30 MG tablet Take 1 tablet by mouth every 4 (four) hours as needed for moderate pain. (Patient not taking: Reported on 02/20/2022)    ? lidocaine-prilocaine (EMLA) cream Apply on the port. 30 -45 min  prior to port access. (Patient not taking: Reported on 02/20/2022) 30 g 3  ? ?No current facility-administered medications for this visit.  ? ?Facility-Administered Medications Ordered in Other Visits  ?Medication Dose Route Frequency Provider Last  Rate Last Admin  ? 0.9 %  sodium chloride infusion   Intravenous Continuous Verlon Au, NP 999 mL/hr at 02/27/22 0945 New Bag at 02/27/22 0945  ? heparin lock flush 100 unit/mL  500 Units Intravenous Once Charlaine Dalton R, MD      ? sodium chloride flush (NS) 0.9 % injection 10 mL  10 mL Intravenous Once Cammie Sickle, MD      ? ? ?PHYSICAL EXAMINATION: ?ECOG PERFORMANCE STATUS: 1 - Symptomatic but completely ambulatory ?Vitals:  ? 02/27/22 0913  ?BP: (!) 148/90  ?Pulse: 77  ?Resp: 19  ?Temp: 98.7 ?F (37.1 ?C)  ?SpO2: 98%  ? ?Filed Weights  ? 02/27/22 0913  ?Weight: 141 lb 3.2 oz (64 kg)  ? ?Physical Exam ?Vitals and nursing note reviewed.  ?HENT:  ?   Head: Normocephalic and atraumatic.  ?   Mouth/Throat:   ?   Pharynx: Oropharynx is clear.  ?Eyes:  ?   Extraocular Movements: Extraocular movements intact.  ?   Pupils: Pupils are equal, round, and reactive to light.  ?Cardiovascular:  ?   Rate and Rhythm: Normal

## 2022-02-28 ENCOUNTER — Encounter: Payer: Self-pay | Admitting: Internal Medicine

## 2022-02-28 NOTE — Progress Notes (Signed)
Met with patient during follow up visit after receiving first cycle of chemotherapy last week. Pt states that he is doing well and states his pain is better controlled today. Pt did not voice any needs or questions at this time. Instructed pt to call with any questions or needs. Pt verbalized understanding.  ?

## 2022-03-04 ENCOUNTER — Other Ambulatory Visit: Payer: Self-pay | Admitting: Internal Medicine

## 2022-03-04 MED ORDER — OXYCODONE-ACETAMINOPHEN 5-325 MG PO TABS: 1.0000 | ORAL_TABLET | Freq: Four times a day (QID) | ORAL | 0 refills | Status: DC | PRN

## 2022-03-04 NOTE — Progress Notes (Signed)
Called in a script for oxycocodone; after speaking to wife. GB ?

## 2022-03-08 ENCOUNTER — Inpatient Hospital Stay (HOSPITAL_BASED_OUTPATIENT_CLINIC_OR_DEPARTMENT_OTHER): Payer: BC Managed Care – PPO | Admitting: Hospice and Palliative Medicine

## 2022-03-08 DIAGNOSIS — C3412 Malignant neoplasm of upper lobe, left bronchus or lung: Secondary | ICD-10-CM

## 2022-03-08 NOTE — Progress Notes (Signed)
Multidisciplinary Oncology Council Documentation ? ?EITAN DOUBLEDAY was presented by our [facility] on 03/08/2022, which included representatives from:  ?Palliative Care ?Dietitian  ?Physical/Occupational Therapist ?Nurse Navigator ?Genetics ?Speech Therapist ?Social work ?Survivorship RN ?Financial Navigator ?Research RN ? ? ?Rielly currently presents with history of lung cancer ? ?We reviewed previous medical and familial history, history of present illness, and recent lab results along with all available histopathologic and imaging studies. The [facility] considered available treatment options and made the following recommendations/referrals: ? ?Genetics and social work ? ?The MOC is a meeting of clinicians from various specialty areas who evaluate and discuss patients for whom a multidisciplinary approach is being considered. Final determinations in the plan of care are those of the provider(s).  ? ?Today's extended care, comprehensive team conference, Amr was not present for the discussion and was not examined.  ? ?

## 2022-03-10 ENCOUNTER — Encounter: Payer: Self-pay | Admitting: Licensed Clinical Social Worker

## 2022-03-10 MED FILL — Dexamethasone Sodium Phosphate Inj 100 MG/10ML: INTRAMUSCULAR | Qty: 1 | Status: AC

## 2022-03-10 MED FILL — Fosaprepitant Dimeglumine For IV Infusion 150 MG (Base Eq): INTRAVENOUS | Qty: 5 | Status: AC

## 2022-03-10 NOTE — Progress Notes (Signed)
[facility]  Clinical Social Work was referred by medical provider for assessment of psychosocial needs.  Clinical Social Worker attempted to contact patient by phone  to offer support and assess for needs.  CSW left voicemail with contact information and request for return call.   First Attempt   Adelene Amas, [place]

## 2022-03-13 ENCOUNTER — Inpatient Hospital Stay: Payer: BC Managed Care – PPO

## 2022-03-13 ENCOUNTER — Inpatient Hospital Stay (HOSPITAL_BASED_OUTPATIENT_CLINIC_OR_DEPARTMENT_OTHER): Payer: BC Managed Care – PPO | Admitting: Internal Medicine

## 2022-03-13 ENCOUNTER — Encounter: Payer: Self-pay | Admitting: Internal Medicine

## 2022-03-13 ENCOUNTER — Encounter: Payer: Self-pay | Admitting: *Deleted

## 2022-03-13 VITALS — BP 118/82 | HR 85

## 2022-03-13 DIAGNOSIS — C3412 Malignant neoplasm of upper lobe, left bronchus or lung: Secondary | ICD-10-CM

## 2022-03-13 DIAGNOSIS — Z5112 Encounter for antineoplastic immunotherapy: Secondary | ICD-10-CM | POA: Diagnosis not present

## 2022-03-13 LAB — CBC WITH DIFFERENTIAL/PLATELET
Abs Immature Granulocytes: 0.18 10*3/uL — ABNORMAL HIGH (ref 0.00–0.07)
Basophils Absolute: 0.1 10*3/uL (ref 0.0–0.1)
Basophils Relative: 1 %
Eosinophils Absolute: 0 10*3/uL (ref 0.0–0.5)
Eosinophils Relative: 0 %
HCT: 35.3 % — ABNORMAL LOW (ref 39.0–52.0)
Hemoglobin: 11.8 g/dL — ABNORMAL LOW (ref 13.0–17.0)
Immature Granulocytes: 2 %
Lymphocytes Relative: 16 %
Lymphs Abs: 1.7 10*3/uL (ref 0.7–4.0)
MCH: 29.9 pg (ref 26.0–34.0)
MCHC: 33.4 g/dL (ref 30.0–36.0)
MCV: 89.4 fL (ref 80.0–100.0)
Monocytes Absolute: 0.9 10*3/uL (ref 0.1–1.0)
Monocytes Relative: 8 %
Neutro Abs: 7.7 10*3/uL (ref 1.7–7.7)
Neutrophils Relative %: 73 %
Platelets: 213 10*3/uL (ref 150–400)
RBC: 3.95 MIL/uL — ABNORMAL LOW (ref 4.22–5.81)
RDW: 14.2 % (ref 11.5–15.5)
WBC: 10.5 10*3/uL (ref 4.0–10.5)
nRBC: 0.5 % — ABNORMAL HIGH (ref 0.0–0.2)

## 2022-03-13 LAB — COMPREHENSIVE METABOLIC PANEL
ALT: 36 U/L (ref 0–44)
AST: 24 U/L (ref 15–41)
Albumin: 3.6 g/dL (ref 3.5–5.0)
Alkaline Phosphatase: 144 U/L — ABNORMAL HIGH (ref 38–126)
Anion gap: 7 (ref 5–15)
BUN: 12 mg/dL (ref 6–20)
CO2: 24 mmol/L (ref 22–32)
Calcium: 8.4 mg/dL — ABNORMAL LOW (ref 8.9–10.3)
Chloride: 106 mmol/L (ref 98–111)
Creatinine, Ser: 0.87 mg/dL (ref 0.61–1.24)
GFR, Estimated: >60 mL/min (ref >60)
Glucose, Bld: 142 mg/dL — ABNORMAL HIGH (ref 70–99)
Potassium: 3.1 mmol/L — ABNORMAL LOW (ref 3.5–5.1)
Sodium: 137 mmol/L (ref 135–145)
Total Bilirubin: 0.9 mg/dL (ref 0.3–1.2)
Total Protein: 7.3 g/dL (ref 6.5–8.1)

## 2022-03-13 MED ORDER — SODIUM CHLORIDE 0.9 % IV SOLN
1200.0000 mg | Freq: Once | INTRAVENOUS | Status: AC
  Administered 2022-03-13: 1200 mg via INTRAVENOUS
  Filled 2022-03-13: qty 20

## 2022-03-13 MED ORDER — SODIUM CHLORIDE 0.9 % IV SOLN
100.0000 mg/m2 | Freq: Once | INTRAVENOUS | Status: AC
  Administered 2022-03-13: 180 mg via INTRAVENOUS
  Filled 2022-03-13: qty 9

## 2022-03-13 MED ORDER — SODIUM CHLORIDE 0.9 % IV SOLN
10.0000 mg | Freq: Once | INTRAVENOUS | Status: AC
  Administered 2022-03-13: 10 mg via INTRAVENOUS
  Filled 2022-03-13: qty 10

## 2022-03-13 MED ORDER — SODIUM CHLORIDE 0.9 % IV SOLN
609.0000 mg | Freq: Once | INTRAVENOUS | Status: AC
  Administered 2022-03-13: 610 mg via INTRAVENOUS
  Filled 2022-03-13: qty 61

## 2022-03-13 MED ORDER — PALONOSETRON HCL INJECTION 0.25 MG/5ML
0.2500 mg | Freq: Once | INTRAVENOUS | Status: AC
  Administered 2022-03-13: 0.25 mg via INTRAVENOUS
  Filled 2022-03-13: qty 5

## 2022-03-13 MED ORDER — ZOLEDRONIC ACID 4 MG/100ML IV SOLN: 4.0000 mg | Freq: Once | INTRAVENOUS | Status: DC

## 2022-03-13 MED ORDER — SODIUM CHLORIDE 0.9 % IV SOLN
150.0000 mg | Freq: Once | INTRAVENOUS | Status: AC
  Administered 2022-03-13: 150 mg via INTRAVENOUS
  Filled 2022-03-13: qty 150

## 2022-03-13 MED ORDER — HEPARIN SOD (PORK) LOCK FLUSH 100 UNIT/ML IV SOLN
INTRAVENOUS | Status: DC
Start: 2022-03-13 — End: 2022-03-13
  Filled 2022-03-13: qty 5

## 2022-03-13 MED ORDER — HEPARIN SOD (PORK) LOCK FLUSH 100 UNIT/ML IV SOLN
500.0000 [IU] | Freq: Once | INTRAVENOUS | Status: AC | PRN
  Administered 2022-03-13: 500 [IU]
  Filled 2022-03-13: qty 5

## 2022-03-13 MED ORDER — SODIUM CHLORIDE 0.9 % IV SOLN
Freq: Once | INTRAVENOUS | Status: AC
  Filled 2022-03-13: qty 250

## 2022-03-13 MED ORDER — POTASSIUM CHLORIDE CRYS ER 20 MEQ PO TBCR: EXTENDED_RELEASE_TABLET | ORAL | 3 refills | Status: DC

## 2022-03-13 MED FILL — Dexamethasone Sodium Phosphate Inj 100 MG/10ML: INTRAMUSCULAR | Qty: 1 | Status: AC

## 2022-03-13 NOTE — Patient Instructions (Signed)
[facility]  Discharge Instructions: °Thank you for choosing [facility] to provide your oncology and hematology care.  ° °If you have a lab appointment with the Cancer Center, please go directly to the Cancer Center and check in at the registration area. °  °Wear comfortable clothing and clothing appropriate for easy access to any Portacath or PICC line.  ° °We strive to give you quality time with your provider. You may need to reschedule your appointment if you arrive late (15 or more minutes).  Arriving late affects you and other patients whose appointments are after yours.  Also, if you miss three or more appointments without notifying the office, you may be dismissed from the clinic at the provider’s discretion.    °  °For prescription refill requests, have your pharmacy contact our office and allow 72 hours for refills to be completed.   ° °Today you received the following chemotherapy and/or immunotherapy agents     °  °To help prevent nausea and vomiting after your treatment, we encourage you to take your nausea medication as directed. ° °BELOW ARE SYMPTOMS THAT SHOULD BE REPORTED IMMEDIATELY: °*FEVER GREATER THAN 100.4 F (38 °C) OR HIGHER °*CHILLS OR SWEATING °*NAUSEA AND VOMITING THAT IS NOT CONTROLLED WITH YOUR NAUSEA MEDICATION °*UNUSUAL SHORTNESS OF BREATH °*UNUSUAL BRUISING OR BLEEDING °*URINARY PROBLEMS (pain or burning when urinating, or frequent urination) °*BOWEL PROBLEMS (unusual diarrhea, constipation, pain near the anus) °TENDERNESS IN MOUTH AND THROAT WITH OR WITHOUT PRESENCE OF ULCERS (sore throat, sores in mouth, or a toothache) °UNUSUAL RASH, SWELLING OR PAIN  °UNUSUAL VAGINAL DISCHARGE OR ITCHING  ° °Items with * indicate a potential emergency and should be followed up as soon as possible or go to the Emergency Department if any problems should occur. ° °Please show the CHEMOTHERAPY ALERT CARD or IMMUNOTHERAPY ALERT CARD at check-in to the  Emergency Department and triage nurse. ° °Should you have questions after your visit or need to cancel or reschedule your appointment, please contact [facility]  Dept: [phone]  and follow the prompts.  Office hours are 8:00 a.m. to 4:30 p.m. Monday - Friday. Please note that voicemails left after 4:00 p.m. may not be returned until the following business day.  We are closed weekends and major holidays. You have access to a nurse at all times for urgent questions. Please call the main number to the clinic Dept: [phone] and follow the prompts. ° ° °For any non-urgent questions, you may also contact your provider using MyChart. We now offer e-Visits for anyone 18 and older to request care online for non-urgent symptoms. For details visit [email or web address]. °  °Also download the MyChart app! Go to the app store, search "MyChart", open the app, select , and log in with your MyChart username and password. ° °Due to Covid, a mask is required upon entering the hospital/clinic. If you do not have a mask, one will be given to you upon arrival. For doctor visits, patients may have 1 support person aged 18 or older with them. For treatment visits, patients cannot have anyone with them due to current Covid guidelines and our immunocompromised population.  ° °

## 2022-03-13 NOTE — Assessment & Plan Note (Addendum)
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 April 2023-brain MRI-4 mm x 2 metastases noted.  Currently on [place] etoposide-Tecentriq every 3 weeks x 4 cycles; followed by maintenance Tecentriq post chemotherapy.   #Proceed with cycle #2 of carboplatin etoposide-Tecentriq; labs today reviewed;  acceptable for treatment today-except for elevated LFTs see below. Will plan imaging after 3 cycles.  Will order at next visit.  #Hypokalemia potassium 3.1 secondary chemotherapy-recommend K-Dur twice a day; will send prescription.  # Elevated LFTs-unclear etiology-patient malignancy versus others. NEG hepatitis panel- Improved.   # #Brain metastases : April 2023-MRI brain 4 mm x 2 [asymptomatic/screening MRI]-recommend holding off radiation.  Will order at next visit.  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 s/p Zometa-4 mg IV every  6 weeks.   #Left chest wall skin rash anterior-appears allergic; no concerns for shingles.  Recommend hydrocortisone ointment.  # Smoking: Active smoker;  the process of quitting.  # COPD: Continue inhalers [Dr.Aleskerov]- STABLE.   # DISPOSITION: # chemo today;Zometa; and chemo this week as planned;  D-4 Udenyca  # in 1 week-; NP- labs-cbc/cmp;possible iVFs over 1 hour  # follow up in 3 weeks with MD-  d-1 of chemo; carbo-etop-tecentriq;  ; d-2 etop; d-3 etop;possible Zometa; D-4 Jose Morse; - Dr.B

## 2022-03-13 NOTE — Progress Notes (Signed)
Rash left side area, itchy x5 days.

## 2022-03-13 NOTE — Progress Notes (Signed)
Met with patient during follow up visit with Dr. Rogue Bussing. No questions or needs at the time of visit. Instructed pt to call with any questions or needs. Pt verbalized understanding.

## 2022-03-13 NOTE — Progress Notes (Signed)
[facility] CONSULT NOTE  Patient Care Team: Tracie Harrier, MD as PCP - General (Internal Medicine) Telford Nab, RN as Oncology Nurse Navigator Cammie Sickle, MD as Consulting Physician (Oncology)  CHIEF COMPLAINTS/PURPOSE OF CONSULTATION: lung cancer  #  Oncology History Overview Note  # . LUNG, LEFT UPPER LOBE; ENB-GUIDED TRANSBRONCHIAL FORCEPS BIOPSY: - SMALL CELL CARCINOMA.  [Dr.Aleskerov]  Comment:  Invasive carcinoma is present in two fragments, with crush artifact. The  cells are small, with scant cytoplasm, nuclear molding, and  inconspicuous nucleoli. There is necrosis.   Immunohistochemistry (IHC) was performed for further characterization.  The neoplastic cells are positive for CD56 with strong staining of over  90% of cells. They are negative for TTF-1 and p40.    IMPRESSION: 1. LEFT suprahilar mass constricting LEFT upper lobe bronchus consistent with primary bronchogenic carcinoma. 2. Ipsilateral mediastinal nodal metastasis. 3. Distant visceral metastasis to the LIVER and bilateral ADRENAL GLANDS. 4. Distant nodal metastasis to the LEFT external iliac node. 5. Multifocal hypermetabolic skeletal metastasis (approximately 40 lesions). Lesions are occult by CT imaging. 6. Focal activity beneath the skin posterior to the RIGHT ear. Differential includes nodal metastasis versus primary parotid neoplasm. Favor nodal metastasis. (Recommend attention on brain MRI workup).     Electronically Signed   By: Suzy Bouchard M.D.   On: 02/06/2022 13:00  # April 2023-brain MRI incidental/screening 4 mm x 2 cerebellar lesions.  # MAY 1st, 2023- carboo-Etop [d-1-3]; udenyca   Primary cancer of left upper lobe of lung ([facility])  02/16/2022 Initial Diagnosis   Primary cancer of left upper lobe of lung ([place])   02/16/2022 Cancer Staging   Staging form: Lung, AJCC 8th Edition - Clinical: Stage IVB (cT3, cN2, pM1c) - Signed by Cammie Sickle,  MD on 02/16/2022 Stage prefix: Initial diagnosis    02/20/2022 -  Chemotherapy   Patient is on Treatment Plan : LUNG SCLC Carboplatin + Etoposide + Atezolizumab Induction q21d / Atezolizumab Maintenance q21d         HISTORY OF PRESENTING ILLNESS: Ambulating independently.  Alone.   BARAKA KLATT 51 y.o.  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  Left chest wall skin rash- x 5 day; itch; no pain.   Pain improved; currently on tramadol up to 2 a day. Not needing oxycodone.  No nausea/vomiting.   Denies any worsening shortness of breath or cough.  Denies hemoptysis.  Denies any worsening headaches.  Appetite is good.  No weight loss.   Review of Systems  Constitutional:  Positive for malaise/fatigue. Negative for chills, diaphoresis, fever and weight loss.  HENT:  Negative for nosebleeds and sore throat.   Eyes:  Negative for double vision.  Respiratory:  Positive for wheezing. Negative for cough, hemoptysis, sputum production and shortness of breath.   Cardiovascular:  Positive for chest pain. Negative for palpitations, orthopnea and leg swelling.  Gastrointestinal:  Negative for abdominal pain, blood in stool, constipation, diarrhea, heartburn, melena, nausea and vomiting.  Genitourinary:  Negative for dysuria, frequency and urgency.  Musculoskeletal:  Positive for back pain and joint pain.  Skin: Negative.  Negative for itching and rash.  Neurological:  Negative for dizziness, tingling, focal weakness, weakness and headaches.  Endo/Heme/Allergies:  Does not bruise/bleed easily.  Psychiatric/Behavioral:  Negative for depression. The patient is not nervous/anxious and does not have insomnia.     MEDICAL HISTORY:  Past Medical History:  Diagnosis Date   History of  kidney stones    Hypercholesteremia     SURGICAL HISTORY: Past Surgical History:  Procedure Laterality Date   CHOLECYSTECTOMY     IR  IMAGING GUIDED PORT INSERTION  02/23/2022   VIDEO BRONCHOSCOPY WITH ENDOBRONCHIAL NAVIGATION N/A 02/10/2022   Procedure: VIDEO BRONCHOSCOPY WITH ENDOBRONCHIAL NAVIGATION;  Surgeon: Ottie Glazier, MD;  Location: [facility] ORS;  Service: Thoracic;  Laterality: N/A;   VIDEO BRONCHOSCOPY WITH ENDOBRONCHIAL ULTRASOUND N/A 02/10/2022   Procedure: VIDEO BRONCHOSCOPY WITH ENDOBRONCHIAL ULTRASOUND;  Surgeon: Ottie Glazier, MD;  Location: [facility] ORS;  Service: Thoracic;  Laterality: N/A;    SOCIAL HISTORY: Social History   Socioeconomic History   Marital status: Married    Spouse name: Not on file   Number of children: Not on file   Years of education: Not on file   Highest education level: Not on file  Occupational History   Not on file  Tobacco Use   Smoking status: Every Day    Packs/day: 0.50    Years: 34.00    Pack years: 17.00    Types: Cigarettes   Smokeless tobacco: Never  Vaping Use   Vaping Use: Never used  Substance and Sexual Activity   Alcohol use: No   Drug use: Not Currently    Types: Marijuana   Sexual activity: Yes  Other Topics Concern   Not on file  Social History Narrative   Lives in [place]; with wife/son- 13 y [2023]; works in [employer]. Smoker; no alcohol.    Social Determinants of Health   Financial Resource Strain: Not on file  Food Insecurity: Not on file  Transportation Needs: Not on file  Physical Activity: Not on file  Stress: Not on file  Social Connections: Not on file  Intimate Partner Violence: Not on file    FAMILY HISTORY: Family History  Problem Relation Age of Onset   Ovarian cancer Mother    Colon cancer Father    Diabetes Father     ALLERGIES:  is allergic to codeine.  MEDICATIONS:  Current Outpatient Medications  Medication Sig Dispense Refill   albuterol (PROVENTIL HFA) 108 (90 Base) MCG/ACT inhaler Inhale 2 puffs into the lungs every 4 (four) hours as needed for wheezing or shortness of breath. 1 each 0    Budeson-Glycopyrrol-Formoterol (BREZTRI AEROSPHERE) 160-9-4.8 MCG/ACT AERO Inhale 2 puffs into the lungs in the morning and at bedtime.     lidocaine-prilocaine (EMLA) cream Apply on the port. 30 -45 min  prior to port access. 30 g 3   ondansetron (ZOFRAN) 8 MG tablet One pill every 8 hours as needed for nausea/vomitting. 40 tablet 1   potassium chloride SA (KLOR-CON M) 20 MEQ tablet 1 pill twice a day 60 tablet 3   prochlorperazine (COMPAZINE) 10 MG tablet Take 1 tablet (10 mg total) by mouth every 6 (six) hours as needed for nausea or vomiting. 40 tablet 1   rosuvastatin (CRESTOR) 5 MG tablet Take 5 mg by mouth at bedtime.     traMADol (ULTRAM) 50 MG tablet Take 1 tablet (50 mg total) by mouth every 6 (six) hours as needed. 60 tablet 0   acetaminophen-codeine (TYLENOL #3) 300-30 MG tablet Take 1 tablet by mouth every 4 (four) hours as needed for moderate pain. (Patient not taking: Reported on 02/20/2022)     oxyCODONE-acetaminophen (PERCOCET/ROXICET) 5-325 MG tablet Take 1 tablet by mouth every 6 (six) hours as needed for severe pain. (Patient not taking: Reported on 03/13/2022) 60 tablet 0   No current facility-administered medications for  this visit.   Facility-Administered Medications Ordered in Other Visits  Medication Dose Route Frequency Provider Last Rate Last Admin   atezolizumab (TECENTRIQ) 1,200 mg in sodium chloride 0.9 % 250 mL chemo infusion  1,200 mg Intravenous Once Cammie Sickle, MD       CARBOplatin (PARAPLATIN) 610 mg in sodium chloride 0.9 % 250 mL chemo infusion  610 mg Intravenous Once Charlaine Dalton R, MD       etoposide (VEPESID) 180 mg in sodium chloride 0.9 % 500 mL chemo infusion  100 mg/m2 (Treatment Plan Recorded) Intravenous Once Charlaine Dalton R, MD       heparin lock flush 100 unit/mL  500 Units Intracatheter Once PRN Cammie Sickle, MD          Left chest wall skin rash anterior-maculopapular skin rash.;  Not in dermatomal  distribution.  PHYSICAL EXAMINATION: ECOG PERFORMANCE STATUS: 1 - Symptomatic but completely ambulatory  Vitals:   03/13/22 0828  BP: 124/86  Pulse: 92  Temp: 97.7 F (36.5 C)  SpO2: 99%   Filed Weights   03/13/22 0828  Weight: 146 lb 6.4 oz (66.4 kg)    Physical Exam Vitals and nursing note reviewed.  HENT:     Head: Normocephalic and atraumatic.     Mouth/Throat:     Pharynx: Oropharynx is clear.  Eyes:     Extraocular Movements: Extraocular movements intact.     Pupils: Pupils are equal, round, and reactive to light.  Cardiovascular:     Rate and Rhythm: Normal rate and regular rhythm.  Pulmonary:     Comments: Decreased breath sounds bilaterally.  Abdominal:     Palpations: Abdomen is soft.  Musculoskeletal:        General: Normal range of motion.     Cervical back: Normal range of motion.  Skin:    General: Skin is warm.  Neurological:     General: No focal deficit present.     Mental Status: He is alert and oriented to person, place, and time.  Psychiatric:        Behavior: Behavior normal.        Judgment: Judgment normal.     LABORATORY DATA:  I have reviewed the data as listed Lab Results  Component Value Date   WBC 10.5 03/13/2022   HGB 11.8 (L) 03/13/2022   HCT 35.3 (L) 03/13/2022   MCV 89.4 03/13/2022   PLT 213 03/13/2022   Recent Labs    02/20/22 0852 02/27/22 0902 03/13/22 0830  NA 137 132* 137  K 3.5 3.4* 3.1*  CL 102 99 106  CO2 27 21* 24  GLUCOSE 122* 128* 142*  BUN 12 15 12   CREATININE 0.91 0.86 0.87  CALCIUM 9.6 8.9 8.4*  GFRNONAA >60 >60 >60  PROT 8.1 7.6 7.3  ALBUMIN 3.5 3.5 3.6  AST 38 29 24  ALT 64* 34 36  ALKPHOS 189* 147* 144*  BILITOT 0.7 1.0 0.9    RADIOGRAPHIC STUDIES: I have personally reviewed the radiological images as listed and agreed with the findings in the report. MR BRAIN W WO CONTRAST  Result Date: 02/16/2022 CLINICAL DATA:  Small-cell lung cancer, staging EXAM: MRI HEAD WITHOUT AND WITH CONTRAST  TECHNIQUE: Multiplanar, multiecho pulse sequences of the brain and surrounding structures were obtained without and with intravenous contrast. CONTRAST:  75mL GADAVIST GADOBUTROL 1 MMOL/ML IV SOLN COMPARISON:  None. FINDINGS: Brain: There is a 4 mm enhancing lesion of the parasagittal inferior left cerebellum (series 18, image 27).  A second 4 mm lesion is present at the same level within the lateral right cerebellum. Punctate foci of enhancement on the left on images 30 and 39. There is no edema. There is no acute infarction or intracranial hemorrhage. There is no hydrocephalus or extra-axial fluid collection. Ventricles and sulci are normal in size and configuration. Vascular: Major vessel flow voids at the skull base are preserved. Skull and upper cervical spine: Focal abnormal marrow signal within the posterior right parietal calvarium. Abnormal marrow signal at C4 is partially imaged. Sinuses/Orbits: Polypoid paranasal sinus mucosal thickening. Orbits are unremarkable. Other: Sella is unremarkable. Mastoid air cells are clear. Indeterminate small right periauricular lymph node posterior to the parotid (series 18, image 12). IMPRESSION: At least two small cerebellar metastases as described.  No edema. Right parietal calvarium metastasis. C4 vertebral body metastasis. Nonspecific right periauricular lymph node posterior to the parotid. This was hypermetabolic on PET suggesting nodal metastasis. Electronically Signed   By: Macy Mis M.D.   On: 02/16/2022 12:42   IR IMAGING GUIDED PORT INSERTION  Result Date: 02/23/2022 INDICATION: Lung cancer, chemotherapy access EXAM: Ultrasound-guided puncture right internal jugular vein Placement of a right-sided chest port using fluoroscopic guidance MEDICATIONS: Per EMR ANESTHESIA/SEDATION: Moderate (conscious) sedation was employed during this procedure. A total of Versed 2 mg and Fentanyl 100 mcg was administered intravenously. Moderate Sedation Time: 18 minutes. The  patient's level of consciousness and vital signs were monitored continuously by radiology nursing throughout the procedure under my direct supervision. FLUOROSCOPY TIME:  Fluoroscopy Time: 0.3 minutes (0.6 mGy) COMPLICATIONS: None immediate. PROCEDURE: Informed written consent was obtained from the patient after a thorough discussion of the procedural risks, benefits and alternatives. All questions were addressed. Maximal Sterile Barrier Technique was utilized including caps, mask, sterile gowns, sterile gloves, sterile drape, hand hygiene and skin antiseptic. A timeout was performed prior to the initiation of the procedure. The patient was placed supine on the exam table. The right neck and chest was prepped and draped in the standard sterile fashion. A preliminary ultrasound of the right neck was performed and demonstrates a patent right internal jugular vein. A permanent ultrasound image was stored in the electronic medical record. The overlying skin was anesthetized with 1% Lidocaine. Using ultrasound guidance, access was obtained into the right internal jugular vein using a 21 gauge micropuncture set. A wire was advanced into the SVC, a short incision was made at the puncture site, and serial dilatation performed. Next, in an ipsilateral infraclavicular location, an incision was made at the site of the subcutaneous reservoir. Blunt dissection was used to open a pocket to contain the reservoir. A subcutaneous tunnel was then created from the port site to the puncture site. A(n) 8 Fr single lumen catheter was advanced through the tunnel. The catheter was attached to the port and this was placed in the subcutaneous pocket. Under fluoroscopic guidance, a peel away sheath was placed, and the catheter was trimmed to the appropriate length and was advanced into the central veins. The catheter length is 23 cm. The tip of the catheter lies near the superior cavoatrial junction. The port flushes and aspirates  appropriately. The port was flushed and locked with heparinized saline. The port pocket was closed in 2 layers using 3-0 and 4-0 Vicryl/absorbable suture. Dermabond was also applied to both incisions. The patient tolerated the procedure well and was transferred to recovery in stable condition. IMPRESSION: Successful placement of a right chest port via the right internal jugular vein. The port  is ready for immediate use. Electronically Signed   By: Albin Felling M.D.   On: 02/23/2022 11:00    ASSESSMENT & PLAN:   Primary cancer of left upper lobe of lung ([facility]) #Extensive stage small cell lung cancer /stage IV. April 2023-brain MRI-4 mm x 2 metastases noted.  Currently on [place] etoposide-Tecentriq every 3 weeks x 4 cycles; followed by maintenance Tecentriq post chemotherapy.   #Proceed with cycle #2 of carboplatin etoposide-Tecentriq; labs today reviewed;  acceptable for treatment today-except for elevated LFTs see below. Will plan imaging after 3 cycles.  Will order at next visit.  #Hypokalemia potassium 3.1 secondary chemotherapy-recommend K-Dur twice a day; will send prescription.  # Elevated LFTs-unclear etiology-patient malignancy versus others. NEG hepatitis panel- Improved.   # #Brain metastases : April 2023-MRI brain 4 mm x 2 [asymptomatic/screening MRI]-recommend holding off radiation.  Will order at next visit.  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 s/p Zometa-4 mg IV every  6 weeks.   #Left chest wall skin rash anterior-appears allergic; no concerns for shingles.  Recommend hydrocortisone ointment.  # Smoking: Active smoker;  the process of quitting.  # COPD: Continue inhalers [Dr.Aleskerov]- STABLE.   # DISPOSITION: # chemo today;Zometa; and chemo this week as planned;  D-4 Udenyca  # in 1 week-; NP- labs-cbc/cmp;possible iVFs over 1 hour  # follow up in 3 weeks with MD-  d-1 of chemo;  carbo-etop-tecentriq;  ; d-2 etop; d-3 etop;possible Zometa; D-4 Ellen Henri; - Dr.B     All questions were answered. The patient knows to call the clinic with any problems, questions or concerns.       Cammie Sickle, MD 03/13/2022 10:52 AM

## 2022-03-14 ENCOUNTER — Inpatient Hospital Stay: Payer: BC Managed Care – PPO

## 2022-03-14 VITALS — BP 134/78 | HR 80 | Temp 97.2°F | Resp 16

## 2022-03-14 DIAGNOSIS — C3412 Malignant neoplasm of upper lobe, left bronchus or lung: Secondary | ICD-10-CM

## 2022-03-14 DIAGNOSIS — Z5112 Encounter for antineoplastic immunotherapy: Secondary | ICD-10-CM | POA: Diagnosis not present

## 2022-03-14 MED ORDER — HEPARIN SOD (PORK) LOCK FLUSH 100 UNIT/ML IV SOLN
500.0000 [IU] | Freq: Once | INTRAVENOUS | Status: AC | PRN
  Administered 2022-03-14: 500 [IU]
  Filled 2022-03-14: qty 5

## 2022-03-14 MED ORDER — SODIUM CHLORIDE 0.9 % IV SOLN
Freq: Once | INTRAVENOUS | Status: AC
  Filled 2022-03-14: qty 250

## 2022-03-14 MED ORDER — SODIUM CHLORIDE 0.9 % IV SOLN
10.0000 mg | Freq: Once | INTRAVENOUS | Status: AC
  Administered 2022-03-14: 10 mg via INTRAVENOUS
  Filled 2022-03-14: qty 10

## 2022-03-14 MED ORDER — SODIUM CHLORIDE 0.9 % IV SOLN
100.0000 mg/m2 | Freq: Once | INTRAVENOUS | Status: AC
  Administered 2022-03-14: 180 mg via INTRAVENOUS
  Filled 2022-03-14: qty 9

## 2022-03-14 MED FILL — Dexamethasone Sodium Phosphate Inj 100 MG/10ML: INTRAMUSCULAR | Qty: 1 | Status: AC

## 2022-03-14 NOTE — Progress Notes (Signed)
Nutrition Assessment   Reason for Assessment:  Taste alterations   ASSESSMENT:  51 year old male with lung cancer.  Past medical history of hypercholesterolemia, COPD, smoker.  Patient on carbo-etop-atezo.    Met with patient during infusion.  Patient reports that his appetite is good. Says that he is eating more than he did when he was working.  Usually eats cereal for breakfast, had BLT biscuit and hashbrown this am.  Lunch is usually sandwich.  Evening has meat and couple of sides. Likes to snack on cottage cheese and fruit or pudding. Family and friends have been bringing in meals.  Does report some taste alterations that improved after last treatment. Reports some nausea but relieved with zofran    Medications: zofran, compazine   Labs: reviewed   Anthropometrics:   Height: 67 inches Weight: 146 lb 6.4 oz on 5/22 UBW: 145 lb BMI: 22 Weight stable    NUTRITION DIAGNOSIS: none at this time   INTERVENTION:  Discussed strategies to help with taste alterations. Handout provided Contact information provided   MONITORING, EVALUATION, GOAL: weight trends, intake   Next Visit: Wednesday, June 14 during infusion  Jose Morse B. Zenia Resides, [place], [place] Registered Dietitian [phone]

## 2022-03-14 NOTE — Patient Instructions (Signed)
[facility] [place]  Discharge Instructions: Thank you for choosing [facility] to provide your oncology and hematology care.  If you have a lab appointment with the [facility], please go directly to the [facility] and check in at the registration area.  Wear comfortable clothing and clothing appropriate for easy access to any Portacath or PICC line.   We strive to give you quality time with your provider. You may need to reschedule your appointment if you arrive late (15 or more minutes).  Arriving late affects you and other patients whose appointments are after yours.  Also, if you miss three or more appointments without notifying the office, you may be dismissed from the clinic at the provider's discretion.      For prescription refill requests, have your pharmacy contact our office and allow 72 hours for refills to be completed.    Today you received the following chemotherapy and/or immunotherapy agents Etoposide      To help prevent nausea and vomiting after your treatment, we encourage you to take your nausea medication as directed.  BELOW ARE SYMPTOMS THAT SHOULD BE REPORTED IMMEDIATELY: *FEVER GREATER THAN 100.4 F (38 C) OR HIGHER *CHILLS OR SWEATING *NAUSEA AND VOMITING THAT IS NOT CONTROLLED WITH YOUR NAUSEA MEDICATION *UNUSUAL SHORTNESS OF BREATH *UNUSUAL BRUISING OR BLEEDING *URINARY PROBLEMS (pain or burning when urinating, or frequent urination) *BOWEL PROBLEMS (unusual diarrhea, constipation, pain near the anus) TENDERNESS IN MOUTH AND THROAT WITH OR WITHOUT PRESENCE OF ULCERS (sore throat, sores in mouth, or a toothache) UNUSUAL RASH, SWELLING OR PAIN  UNUSUAL VAGINAL DISCHARGE OR ITCHING   Items with * indicate a potential emergency and should be followed up as soon as possible or go to the Emergency Department if any problems should occur.  Please show the CHEMOTHERAPY ALERT CARD or IMMUNOTHERAPY ALERT CARD at check-in to  the Emergency Department and triage nurse.  Should you have questions after your visit or need to cancel or reschedule your appointment, please contact [facility] [place] [facility] [place]  [phone] and follow the prompts.  Office hours are 8:00 a.m. to 4:30 p.m. Monday - Friday. Please note that voicemails left after 4:00 p.m. may not be returned until the following business day.  We are closed weekends and major holidays. You have access to a nurse at all times for urgent questions. Please call the main number to the clinic [phone] and follow the prompts.  For any non-urgent questions, you may also contact your provider using MyChart. We now offer e-Visits for anyone 66 and older to request care online for non-urgent symptoms. For details visit [email or web address].   Also download the MyChart app! Go to the app store, search "MyChart", open the app, select  [facility], and log in with your MyChart username and password.  Due to Covid, a mask is required upon entering the hospital/clinic. If you do not have a mask, one will be given to you upon arrival. For doctor visits, patients may have 1 support person aged 68 or older with them. For treatment visits, patients cannot have anyone with them due to current Covid guidelines and our immunocompromised population.

## 2022-03-15 ENCOUNTER — Inpatient Hospital Stay: Payer: BC Managed Care – PPO

## 2022-03-15 VITALS — BP 141/85 | HR 78 | Temp 96.9°F

## 2022-03-15 DIAGNOSIS — Z5112 Encounter for antineoplastic immunotherapy: Secondary | ICD-10-CM | POA: Diagnosis not present

## 2022-03-15 DIAGNOSIS — C3412 Malignant neoplasm of upper lobe, left bronchus or lung: Secondary | ICD-10-CM

## 2022-03-15 MED ORDER — SODIUM CHLORIDE 0.9 % IV SOLN
100.0000 mg/m2 | Freq: Once | INTRAVENOUS | Status: AC
  Administered 2022-03-15: 180 mg via INTRAVENOUS
  Filled 2022-03-15: qty 9

## 2022-03-15 MED ORDER — SODIUM CHLORIDE 0.9 % IV SOLN
10.0000 mg | Freq: Once | INTRAVENOUS | Status: AC
  Administered 2022-03-15: 10 mg via INTRAVENOUS
  Filled 2022-03-15: qty 10

## 2022-03-15 MED ORDER — HEPARIN SOD (PORK) LOCK FLUSH 100 UNIT/ML IV SOLN
500.0000 [IU] | Freq: Once | INTRAVENOUS | Status: AC | PRN
  Administered 2022-03-15: 500 [IU]
  Filled 2022-03-15: qty 5

## 2022-03-15 MED ORDER — SODIUM CHLORIDE 0.9 % IV SOLN
Freq: Once | INTRAVENOUS | Status: AC
  Filled 2022-03-15: qty 250

## 2022-03-15 NOTE — Patient Instructions (Signed)
Etoposide, VP-16 injection What is this medication? ETOPOSIDE, VP-16 (e toe POE side) is a chemotherapy drug. It is used to treat testicular cancer, lung cancer, and other cancers. This medicine may be used for other purposes; ask your health care provider or pharmacist if you have questions. COMMON BRAND NAME(S): Etopophos, Toposar, VePesid What should I tell my care team before I take this medication? They need to know if you have any of these conditions: infection kidney disease liver disease low blood counts, like low white cell, platelet, or red cell counts an unusual or allergic reaction to etoposide, other medicines, foods, dyes, or preservatives pregnant or trying to get pregnant breast-feeding How should I use this medication? This medicine is for infusion into a vein. It is administered in a hospital or clinic by a specially trained health care professional. Talk to your pediatrician regarding the use of this medicine in children. Special care may be needed. Overdosage: If you think you have taken too much of this medicine contact a poison control center or emergency room at once. NOTE: This medicine is only for you. Do not share this medicine with others. What if I miss a dose? It is important not to miss your dose. Call your doctor or health care professional if you are unable to keep an appointment. What may interact with this medication? This medicine may interact with the following medications: warfarin This list may not describe all possible interactions. Give your health care provider a list of all the medicines, herbs, non-prescription drugs, or dietary supplements you use. Also tell them if you smoke, drink alcohol, or use illegal drugs. Some items may interact with your medicine. What should I watch for while using this medication? Visit your doctor for checks on your progress. This drug may make you feel generally unwell. This is not uncommon, as chemotherapy can affect  healthy cells as well as cancer cells. Report any side effects. Continue your course of treatment even though you feel ill unless your doctor tells you to stop. In some cases, you may be given additional medicines to help with side effects. Follow all directions for their use. Call your doctor or health care professional for advice if you get a fever, chills or sore throat, or other symptoms of a cold or flu. Do not treat yourself. This drug decreases your body's ability to fight infections. Try to avoid being around people who are sick. This medicine may increase your risk to bruise or bleed. Call your doctor or health care professional if you notice any unusual bleeding. Talk to your doctor about your risk of cancer. You may be more at risk for certain types of cancers if you take this medicine. Do not become pregnant while taking this medicine or for at least 6 months after stopping it. Women should inform their doctor if they wish to become pregnant or think they might be pregnant. Women of child-bearing potential will need to have a negative pregnancy test before starting this medicine. There is a potential for serious side effects to an unborn child. Talk to your health care professional or pharmacist for more information. Do not breast-feed an infant while taking this medicine. Men must use a latex condom during sexual contact with a woman while taking this medicine and for at least 4 months after stopping it. A latex condom is needed even if you have had a vasectomy. Contact your doctor right away if your partner becomes pregnant. Do not donate sperm while taking this  medicine and for at least 4 months after you stop taking this medicine. Men should inform their doctors if they wish to father a child. This medicine may lower sperm counts. What side effects may I notice from receiving this medication? Side effects that you should report to your doctor or health care professional as soon as  possible: allergic reactions like skin rash, itching or hives, swelling of the face, lips, or tongue low blood counts - this medicine may decrease the number of white blood cells, red blood cells, and platelets. You may be at increased risk for infections and bleeding nausea, vomiting redness, blistering, peeling or loosening of the skin, including inside the mouth signs and symptoms of infection like fever; chills; cough; sore throat; pain or trouble passing urine signs and symptoms of low red blood cells or anemia such as unusually weak or tired; feeling faint or lightheaded; falls; breathing problems unusual bruising or bleeding Side effects that usually do not require medical attention (report to your doctor or health care professional if they continue or are bothersome): changes in taste diarrhea hair loss loss of appetite mouth sores This list may not describe all possible side effects. Call your doctor for medical advice about side effects. You may report side effects to FDA at 1-800-FDA-1088. Where should I keep my medication? This drug is given in a hospital or clinic and will not be stored at home. NOTE: This sheet is a summary. It may not cover all possible information. If you have questions about this medicine, talk to your doctor, pharmacist, or health care provider.  2023 Elsevier/Gold Standard (2021-09-09 00:00:00)

## 2022-03-16 ENCOUNTER — Inpatient Hospital Stay: Payer: BC Managed Care – PPO

## 2022-03-16 DIAGNOSIS — Z5112 Encounter for antineoplastic immunotherapy: Secondary | ICD-10-CM | POA: Diagnosis not present

## 2022-03-16 DIAGNOSIS — C3412 Malignant neoplasm of upper lobe, left bronchus or lung: Secondary | ICD-10-CM

## 2022-03-16 MED ORDER — PEGFILGRASTIM-CBQV 6 MG/0.6ML ~~LOC~~ SOSY
6.0000 mg | PREFILLED_SYRINGE | Freq: Once | SUBCUTANEOUS | Status: AC
  Administered 2022-03-16: 6 mg via SUBCUTANEOUS
  Filled 2022-03-16: qty 0.6

## 2022-03-21 ENCOUNTER — Inpatient Hospital Stay (HOSPITAL_BASED_OUTPATIENT_CLINIC_OR_DEPARTMENT_OTHER): Payer: BC Managed Care – PPO | Admitting: Medical Oncology

## 2022-03-21 ENCOUNTER — Inpatient Hospital Stay: Payer: BC Managed Care – PPO

## 2022-03-21 VITALS — BP 123/82 | HR 81 | Temp 97.3°F | Resp 16

## 2022-03-21 DIAGNOSIS — G893 Neoplasm related pain (acute) (chronic): Secondary | ICD-10-CM | POA: Diagnosis not present

## 2022-03-21 DIAGNOSIS — C3412 Malignant neoplasm of upper lobe, left bronchus or lung: Secondary | ICD-10-CM | POA: Diagnosis not present

## 2022-03-21 DIAGNOSIS — R7989 Other specified abnormal findings of blood chemistry: Secondary | ICD-10-CM

## 2022-03-21 DIAGNOSIS — E876 Hypokalemia: Secondary | ICD-10-CM

## 2022-03-21 DIAGNOSIS — Z95828 Presence of other vascular implants and grafts: Secondary | ICD-10-CM

## 2022-03-21 DIAGNOSIS — Z5112 Encounter for antineoplastic immunotherapy: Secondary | ICD-10-CM | POA: Diagnosis not present

## 2022-03-21 LAB — CBC WITH DIFFERENTIAL/PLATELET
Abs Immature Granulocytes: 0.05 10*3/uL (ref 0.00–0.07)
Basophils Absolute: 0.1 10*3/uL (ref 0.0–0.1)
Basophils Relative: 2 %
Eosinophils Absolute: 0 10*3/uL (ref 0.0–0.5)
Eosinophils Relative: 1 %
HCT: 31.6 % — ABNORMAL LOW (ref 39.0–52.0)
Hemoglobin: 10.9 g/dL — ABNORMAL LOW (ref 13.0–17.0)
Immature Granulocytes: 1 %
Lymphocytes Relative: 25 %
Lymphs Abs: 1.2 10*3/uL (ref 0.7–4.0)
MCH: 30.2 pg (ref 26.0–34.0)
MCHC: 34.5 g/dL (ref 30.0–36.0)
MCV: 87.5 fL (ref 80.0–100.0)
Monocytes Absolute: 0.8 10*3/uL (ref 0.1–1.0)
Monocytes Relative: 16 %
Neutro Abs: 2.7 10*3/uL (ref 1.7–7.7)
Neutrophils Relative %: 55 %
Platelets: 145 10*3/uL — ABNORMAL LOW (ref 150–400)
RBC: 3.61 MIL/uL — ABNORMAL LOW (ref 4.22–5.81)
RDW: 13.6 % (ref 11.5–15.5)
Smear Review: NORMAL
WBC: 4.8 10*3/uL (ref 4.0–10.5)
nRBC: 0 % (ref 0.0–0.2)

## 2022-03-21 LAB — COMPREHENSIVE METABOLIC PANEL
ALT: 37 U/L (ref 0–44)
AST: 21 U/L (ref 15–41)
Albumin: 3.7 g/dL (ref 3.5–5.0)
Alkaline Phosphatase: 169 U/L — ABNORMAL HIGH (ref 38–126)
Anion gap: 6 (ref 5–15)
BUN: 13 mg/dL (ref 6–20)
CO2: 25 mmol/L (ref 22–32)
Calcium: 8.9 mg/dL (ref 8.9–10.3)
Chloride: 103 mmol/L (ref 98–111)
Creatinine, Ser: 0.69 mg/dL (ref 0.61–1.24)
GFR, Estimated: >60 mL/min (ref >60)
Glucose, Bld: 118 mg/dL — ABNORMAL HIGH (ref 70–99)
Potassium: 3.8 mmol/L (ref 3.5–5.1)
Sodium: 134 mmol/L — ABNORMAL LOW (ref 135–145)
Total Bilirubin: 0.5 mg/dL (ref 0.3–1.2)
Total Protein: 6.8 g/dL (ref 6.5–8.1)

## 2022-03-21 MED ORDER — HEPARIN SOD (PORK) LOCK FLUSH 100 UNIT/ML IV SOLN
500.0000 [IU] | Freq: Once | INTRAVENOUS | Status: AC
  Administered 2022-03-21: 500 [IU] via INTRAVENOUS
  Filled 2022-03-21: qty 5

## 2022-03-21 MED ORDER — SODIUM CHLORIDE 0.9% FLUSH
10.0000 mL | Freq: Once | INTRAVENOUS | Status: AC
  Administered 2022-03-21: 10 mL via INTRAVENOUS
  Filled 2022-03-21: qty 10

## 2022-03-21 NOTE — Progress Notes (Signed)
Here for post-treatment f/u and possible iv fluids today. Thinks the injection made him feel bad this time. Reports fatigue and headaches.

## 2022-03-21 NOTE — Progress Notes (Signed)
[facility] CONSULT NOTE  Patient Care Team: Tracie Harrier, MD as PCP - General (Internal Medicine) Telford Nab, RN as Oncology Nurse Navigator Cammie Sickle, MD as Consulting Physician (Oncology)  CHIEF COMPLAINTS/PURPOSE OF CONSULTATION: lung cancer  #  Oncology History Overview Note  # . LUNG, LEFT UPPER LOBE; ENB-GUIDED TRANSBRONCHIAL FORCEPS BIOPSY: - SMALL CELL CARCINOMA.  [Dr.Aleskerov]  Comment:  Invasive carcinoma is present in two fragments, with crush artifact. The  cells are small, with scant cytoplasm, nuclear molding, and  inconspicuous nucleoli. There is necrosis.   Immunohistochemistry (IHC) was performed for further characterization.  The neoplastic cells are positive for CD56 with strong staining of over  90% of cells. They are negative for TTF-1 and p40.    IMPRESSION: 1. LEFT suprahilar mass constricting LEFT upper lobe bronchus consistent with primary bronchogenic carcinoma. 2. Ipsilateral mediastinal nodal metastasis. 3. Distant visceral metastasis to the LIVER and bilateral ADRENAL GLANDS. 4. Distant nodal metastasis to the LEFT external iliac node. 5. Multifocal hypermetabolic skeletal metastasis (approximately 40 lesions). Lesions are occult by CT imaging. 6. Focal activity beneath the skin posterior to the RIGHT ear. Differential includes nodal metastasis versus primary parotid neoplasm. Favor nodal metastasis. (Recommend attention on brain MRI workup).     Electronically Signed   By: Suzy Bouchard M.D.   On: 02/06/2022 13:00  # April 2023-brain MRI incidental/screening 4 mm x 2 cerebellar lesions.  # MAY 1st, 2023- carboo-Etop [d-1-3]; udenyca   Primary cancer of left upper lobe of lung ([place])  02/16/2022 Initial Diagnosis   Primary cancer of left upper lobe of lung ([place])   02/16/2022 Cancer Staging   Staging form: Lung, AJCC 8th Edition - Clinical: Stage IVB (cT3, cN2, pM1c) - Signed by Cammie Sickle,  MD on 02/16/2022 Stage prefix: Initial diagnosis    02/20/2022 -  Chemotherapy   Patient is on Treatment Plan : LUNG SCLC Carboplatin + Etoposide + Atezolizumab Induction q21d / Atezolizumab Maintenance q21d         HISTORY OF PRESENTING ILLNESS: Ambulating independently.  Alone.   Jose Morse 51 y.o.  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  Pt reports that he is tolerating his treatment well. This time he did get a headache for 2 days following his Udenyca treatment but tolerated his chemo well. Tramadol resolved this. Uses Oxycodone when bone pain is more severe. He states that his pain is currently well controlled. He reports that right after his chemotherapy his pain greatly improves but the closer out he gets to his next cycle it worsens. No Vomiting, diarrhea, night sweats, SOB. Mild nausea at time but his rescue medications help with this.    Denies any worsening shortness of breath or cough.  Denies hemoptysis.  Denies any worsening headaches.  Appetite is good.  No weight loss.   Review of Systems  Constitutional:  Positive for malaise/fatigue. Negative for chills, diaphoresis, fever and weight loss.  HENT:  Negative for nosebleeds and sore throat.   Eyes:  Negative for double vision.  Respiratory:  Positive for wheezing. Negative for cough, hemoptysis, sputum production and shortness of breath.   Cardiovascular:  Positive for chest pain. Negative for palpitations, orthopnea and leg swelling.  Gastrointestinal:  Negative for abdominal pain, blood in stool, constipation, diarrhea, heartburn, melena, nausea and vomiting.  Genitourinary:  Negative for dysuria, frequency and urgency.  Musculoskeletal:  Positive for back pain and joint pain.  Skin: Negative.  Negative for itching and rash.  Neurological:  Negative for dizziness, tingling, focal weakness, weakness and headaches.  Endo/Heme/Allergies:   Does not bruise/bleed easily.  Psychiatric/Behavioral:  Negative for depression. The patient is not nervous/anxious and does not have insomnia.     MEDICAL HISTORY:  Past Medical History:  Diagnosis Date   History of kidney stones    Hypercholesteremia     SURGICAL HISTORY: Past Surgical History:  Procedure Laterality Date   CHOLECYSTECTOMY     IR IMAGING GUIDED PORT INSERTION  02/23/2022   VIDEO BRONCHOSCOPY WITH ENDOBRONCHIAL NAVIGATION N/A 02/10/2022   Procedure: VIDEO BRONCHOSCOPY WITH ENDOBRONCHIAL NAVIGATION;  Surgeon: Ottie Glazier, MD;  Location: [facility] ORS;  Service: Thoracic;  Laterality: N/A;   VIDEO BRONCHOSCOPY WITH ENDOBRONCHIAL ULTRASOUND N/A 02/10/2022   Procedure: VIDEO BRONCHOSCOPY WITH ENDOBRONCHIAL ULTRASOUND;  Surgeon: Ottie Glazier, MD;  Location: [facility] ORS;  Service: Thoracic;  Laterality: N/A;    SOCIAL HISTORY: Social History   Socioeconomic History   Marital status: Married    Spouse name: Not on file   Number of children: Not on file   Years of education: Not on file   Highest education level: Not on file  Occupational History   Not on file  Tobacco Use   Smoking status: Every Day    Packs/day: 0.50    Years: 34.00    Pack years: 17.00    Types: Cigarettes   Smokeless tobacco: Never  Vaping Use   Vaping Use: Never used  Substance and Sexual Activity   Alcohol use: No   Drug use: Not Currently    Types: Marijuana   Sexual activity: Yes  Other Topics Concern   Not on file  Social History Narrative   Lives in [place]; with wife/son- 30 y [2023]; works in [employer]. Smoker; no alcohol.    Social Determinants of Health   Financial Resource Strain: Not on file  Food Insecurity: Not on file  Transportation Needs: Not on file  Physical Activity: Not on file  Stress: Not on file  Social Connections: Not on file  Intimate Partner Violence: Not on file    FAMILY HISTORY: Family History  Problem Relation Age of Onset   Ovarian cancer  Mother    Colon cancer Father    Diabetes Father     ALLERGIES:  is allergic to codeine.  MEDICATIONS:  Current Outpatient Medications  Medication Sig Dispense Refill   acetaminophen-codeine (TYLENOL #3) 300-30 MG tablet Take 1 tablet by mouth every 4 (four) hours as needed for moderate pain. (Patient not taking: Reported on 02/20/2022)     albuterol (PROVENTIL HFA) 108 (90 Base) MCG/ACT inhaler Inhale 2 puffs into the lungs every 4 (four) hours as needed for wheezing or shortness of breath. 1 each 0   Budeson-Glycopyrrol-Formoterol (BREZTRI AEROSPHERE) 160-9-4.8 MCG/ACT AERO Inhale 2 puffs into the lungs in the morning and at bedtime.     lidocaine-prilocaine (EMLA) cream Apply on the port. 30 -45 min  prior to port access. 30 g 3   ondansetron (ZOFRAN) 8 MG tablet One pill every 8 hours as needed for nausea/vomitting. 40 tablet 1   oxyCODONE-acetaminophen (PERCOCET/ROXICET) 5-325 MG tablet Take 1 tablet by mouth every 6 (six) hours as needed for severe pain. (Patient not taking: Reported on 03/13/2022) 60 tablet 0   potassium chloride SA (KLOR-CON M) 20 MEQ tablet 1 pill twice a day 60 tablet 3   prochlorperazine (COMPAZINE) 10 MG tablet Take 1 tablet (10 mg total) by mouth  every 6 (six) hours as needed for nausea or vomiting. 40 tablet 1   rosuvastatin (CRESTOR) 5 MG tablet Take 5 mg by mouth at bedtime.     traMADol (ULTRAM) 50 MG tablet Take 1 tablet (50 mg total) by mouth every 6 (six) hours as needed. 60 tablet 0   No current facility-administered medications for this visit.      Left chest wall skin rash anterior-maculopapular skin rash.;  Not in dermatomal distribution.  PHYSICAL EXAMINATION: ECOG PERFORMANCE STATUS: 1 - Symptomatic but completely ambulatory  Vitals:   03/21/22 1310  BP: 123/82  Pulse: 81  Resp: 16  Temp: (!) 97.3 F (36.3 C)   There were no vitals filed for this visit.   Physical Exam Vitals and nursing note reviewed.  HENT:     Head: Normocephalic  and atraumatic.     Mouth/Throat:     Pharynx: Oropharynx is clear.  Eyes:     Extraocular Movements: Extraocular movements intact.     Pupils: Pupils are equal, round, and reactive to light.  Cardiovascular:     Rate and Rhythm: Normal rate and regular rhythm.  Pulmonary:     Comments: Decreased breath sounds bilaterally.  Abdominal:     Palpations: Abdomen is soft.  Musculoskeletal:        General: Normal range of motion.     Cervical back: Normal range of motion.  Skin:    General: Skin is warm.  Neurological:     General: No focal deficit present.     Mental Status: He is alert and oriented to person, place, and time.  Psychiatric:        Behavior: Behavior normal.        Judgment: Judgment normal.     LABORATORY DATA:  I have reviewed the data as listed Lab Results  Component Value Date   WBC 4.8 03/21/2022   HGB 10.9 (L) 03/21/2022   HCT 31.6 (L) 03/21/2022   MCV 87.5 03/21/2022   PLT 145 (L) 03/21/2022   Recent Labs    02/27/22 0902 03/13/22 0830 03/21/22 1254  NA 132* 137 134*  K 3.4* 3.1* 3.8  CL 99 106 103  CO2 21* 24 25  GLUCOSE 128* 142* 118*  BUN 15 12 13   CREATININE 0.86 0.87 0.69  CALCIUM 8.9 8.4* 8.9  GFRNONAA >60 >60 >60  PROT 7.6 7.3 6.8  ALBUMIN 3.5 3.6 3.7  AST 29 24 21   ALT 34 36 37  ALKPHOS 147* 144* 169*  BILITOT 1.0 0.9 0.5    RADIOGRAPHIC STUDIES: I have personally reviewed the radiological images as listed and agreed with the findings in the report. IR IMAGING GUIDED PORT INSERTION  Result Date: 02/23/2022 INDICATION: Lung cancer, chemotherapy access EXAM: Ultrasound-guided puncture right internal jugular vein Placement of a right-sided chest port using fluoroscopic guidance MEDICATIONS: Per EMR ANESTHESIA/SEDATION: Moderate (conscious) sedation was employed during this procedure. A total of Versed 2 mg and Fentanyl 100 mcg was administered intravenously. Moderate Sedation Time: 18 minutes. The patient's level of consciousness and  vital signs were monitored continuously by radiology nursing throughout the procedure under my direct supervision. FLUOROSCOPY TIME:  Fluoroscopy Time: 0.3 minutes (0.6 mGy) COMPLICATIONS: None immediate. PROCEDURE: Informed written consent was obtained from the patient after a thorough discussion of the procedural risks, benefits and alternatives. All questions were addressed. Maximal Sterile Barrier Technique was utilized including caps, mask, sterile gowns, sterile gloves, sterile drape, hand hygiene and skin antiseptic. A timeout was performed prior to  the initiation of the procedure. The patient was placed supine on the exam table. The right neck and chest was prepped and draped in the standard sterile fashion. A preliminary ultrasound of the right neck was performed and demonstrates a patent right internal jugular vein. A permanent ultrasound image was stored in the electronic medical record. The overlying skin was anesthetized with 1% Lidocaine. Using ultrasound guidance, access was obtained into the right internal jugular vein using a 21 gauge micropuncture set. A wire was advanced into the SVC, a short incision was made at the puncture site, and serial dilatation performed. Next, in an ipsilateral infraclavicular location, an incision was made at the site of the subcutaneous reservoir. Blunt dissection was used to open a pocket to contain the reservoir. A subcutaneous tunnel was then created from the port site to the puncture site. A(n) 8 Fr single lumen catheter was advanced through the tunnel. The catheter was attached to the port and this was placed in the subcutaneous pocket. Under fluoroscopic guidance, a peel away sheath was placed, and the catheter was trimmed to the appropriate length and was advanced into the central veins. The catheter length is 23 cm. The tip of the catheter lies near the superior cavoatrial junction. The port flushes and aspirates appropriately. The port was flushed and locked  with heparinized saline. The port pocket was closed in 2 layers using 3-0 and 4-0 Vicryl/absorbable suture. Dermabond was also applied to both incisions. The patient tolerated the procedure well and was transferred to recovery in stable condition. IMPRESSION: Successful placement of a right chest port via the right internal jugular vein. The port is ready for immediate use. Electronically Signed   By: Albin Felling M.D.   On: 02/23/2022 11:00    ASSESSMENT & PLAN:   No problem-specific Assessment & Plan notes found for this encounter.  Encounter Diagnoses  Name Primary?   Primary cancer of left upper lobe of lung ([facility]) Yes   Elevated liver function tests    Cancer related pain    Hypokalemia    Hypercalcemia   #Extensive stage small cell lung cancer /stage IV. April 2023-brain MRI-4 mm x 2 metastases noted.  Currently on [place] etoposide-Tecentriq every 3 weeks x 4 cycles; followed by maintenance Tecentriq post chemotherapy.    #Patient tolerating treatment well. Labs reviewed. He elects to not receive IVF today. Instead will increase Gatorade. Continue follow up as previously planned.    #Hypokalemia : Improved today with home oral supplementation. Will continue to monitor.   #Hypocalcemia- Improved on oral supplementation.    # Elevated LFTs-unclear etiology- Will continue to monitor.    # #Brain metastases : April 2023-MRI brain 4 mm x 2 [asymptomatic/screening MRI]-recommend holding off radiation.  Plan was to be ordered at his next visit with Dr. 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 s/p Zometa-4 mg IV every  6 weeks.    # Smoking: Active smoker;  the process of quitting.   # COPD: Continue inhalers [Dr.Aleskerov]- STABLE.   All questions were answered. The patient knows to call the clinic with any problems, questions or concerns.       Hughie Closs, PA-C 03/21/2022 1:59  PM

## 2022-03-22 MED ORDER — OXYCODONE-ACETAMINOPHEN 5-325 MG PO TABS: 1.0000 | ORAL_TABLET | Freq: Four times a day (QID) | ORAL | 0 refills | Status: AC | PRN

## 2022-03-22 NOTE — Addendum Note (Signed)
Addended by: Nelwyn Salisbury on: 03/22/2022 08:42 AM   Modules accepted: Orders

## 2022-03-29 ENCOUNTER — Inpatient Hospital Stay: Payer: BC Managed Care – PPO

## 2022-03-29 ENCOUNTER — Inpatient Hospital Stay: Payer: BC Managed Care – PPO | Attending: Internal Medicine | Admitting: Licensed Clinical Social Worker

## 2022-03-29 DIAGNOSIS — C7931 Secondary malignant neoplasm of brain: Secondary | ICD-10-CM | POA: Insufficient documentation

## 2022-03-29 DIAGNOSIS — R5383 Other fatigue: Secondary | ICD-10-CM | POA: Insufficient documentation

## 2022-03-29 DIAGNOSIS — Z8041 Family history of malignant neoplasm of ovary: Secondary | ICD-10-CM | POA: Diagnosis not present

## 2022-03-29 DIAGNOSIS — C7951 Secondary malignant neoplasm of bone: Secondary | ICD-10-CM | POA: Insufficient documentation

## 2022-03-29 DIAGNOSIS — C3412 Malignant neoplasm of upper lobe, left bronchus or lung: Secondary | ICD-10-CM | POA: Diagnosis not present

## 2022-03-29 DIAGNOSIS — Z5112 Encounter for antineoplastic immunotherapy: Secondary | ICD-10-CM | POA: Insufficient documentation

## 2022-03-29 DIAGNOSIS — R062 Wheezing: Secondary | ICD-10-CM | POA: Insufficient documentation

## 2022-03-29 DIAGNOSIS — Z800 Family history of malignant neoplasm of digestive organs: Secondary | ICD-10-CM | POA: Insufficient documentation

## 2022-03-29 DIAGNOSIS — R7401 Elevation of levels of liver transaminase levels: Secondary | ICD-10-CM | POA: Insufficient documentation

## 2022-03-29 DIAGNOSIS — E876 Hypokalemia: Secondary | ICD-10-CM | POA: Insufficient documentation

## 2022-03-29 DIAGNOSIS — J449 Chronic obstructive pulmonary disease, unspecified: Secondary | ICD-10-CM | POA: Insufficient documentation

## 2022-03-29 DIAGNOSIS — Z5111 Encounter for antineoplastic chemotherapy: Secondary | ICD-10-CM | POA: Insufficient documentation

## 2022-03-29 DIAGNOSIS — Z5189 Encounter for other specified aftercare: Secondary | ICD-10-CM | POA: Insufficient documentation

## 2022-03-29 DIAGNOSIS — F17210 Nicotine dependence, cigarettes, uncomplicated: Secondary | ICD-10-CM | POA: Insufficient documentation

## 2022-03-29 NOTE — Progress Notes (Signed)
REFERRING PROVIDER: Borders, Kirt Boys, NP [place],  [place] [address]  PRIMARY PROVIDER:  Tracie Harrier, MD  PRIMARY REASON FOR VISIT:  1. Primary cancer of left upper lobe of lung ([place])   2. Family history of ovarian cancer   3. Family history of colon cancer      HISTORY OF PRESENT ILLNESS:   Jose Morse, a 51 y.o. male, was seen for a [place] cancer genetics consultation at the request of Billey Chang, NP personal and family history of cancer.  Jose Morse presents to clinic today to discuss the possibility of a hereditary predisposition to cancer, genetic testing, and to further clarify his future cancer risks, as well as potential cancer risks for family members.   In 2023, at the age of 35, Jose Morse was diagnosed with lung cancer. The treatment plan includes chemotherapy.Marland Kitchen   CANCER HISTORY:  Oncology History Overview Note  # . LUNG, LEFT UPPER LOBE; ENB-GUIDED TRANSBRONCHIAL FORCEPS BIOPSY: - SMALL CELL CARCINOMA.  [Dr.Aleskerov]  Comment:  Invasive carcinoma is present in two fragments, with crush artifact. The  cells are small, with scant cytoplasm, nuclear molding, and  inconspicuous nucleoli. There is necrosis.   Immunohistochemistry (IHC) was performed for further characterization.  The neoplastic cells are positive for CD56 with strong staining of over  90% of cells. They are negative for TTF-1 and p40.    IMPRESSION: 1. LEFT suprahilar mass constricting LEFT upper lobe bronchus consistent with primary bronchogenic carcinoma. 2. Ipsilateral mediastinal nodal metastasis. 3. Distant visceral metastasis to the LIVER and bilateral ADRENAL GLANDS. 4. Distant nodal metastasis to the LEFT external iliac node. 5. Multifocal hypermetabolic skeletal metastasis (approximately 40 lesions). Lesions are occult by CT imaging. 6. Focal activity beneath the skin posterior to the RIGHT ear. Differential includes nodal metastasis versus primary  parotid neoplasm. Favor nodal metastasis. (Recommend attention on brain MRI workup).     Electronically Signed   By: Suzy Bouchard M.D.   On: 02/06/2022 13:00  # April 2023-brain MRI incidental/screening 4 mm x 2 cerebellar lesions.  # MAY 1st, 2023- carboo-Etop [d-1-3]; udenyca   Primary cancer of left upper lobe of lung ([place])  02/16/2022 Initial Diagnosis   Primary cancer of left upper lobe of lung ([place])   02/16/2022 Cancer Staging   Staging form: Lung, AJCC 8th Edition - Clinical: Stage IVB (cT3, cN2, pM1c) - Signed by Cammie Sickle, MD on 02/16/2022 Stage prefix: Initial diagnosis    02/20/2022 -  Chemotherapy   Patient is on Treatment Plan : LUNG SCLC Carboplatin + Etoposide + Atezolizumab Induction q21d / Atezolizumab Maintenance q21d        Past Medical History:  Diagnosis Date   History of kidney stones    Hypercholesteremia     Past Surgical History:  Procedure Laterality Date   CHOLECYSTECTOMY     IR IMAGING GUIDED PORT INSERTION  02/23/2022   VIDEO BRONCHOSCOPY WITH ENDOBRONCHIAL NAVIGATION N/A 02/10/2022   Procedure: VIDEO BRONCHOSCOPY WITH ENDOBRONCHIAL NAVIGATION;  Surgeon: Ottie Glazier, MD;  Location: [facility] ORS;  Service: Thoracic;  Laterality: N/A;   VIDEO BRONCHOSCOPY WITH ENDOBRONCHIAL ULTRASOUND N/A 02/10/2022   Procedure: VIDEO BRONCHOSCOPY WITH ENDOBRONCHIAL ULTRASOUND;  Surgeon: Ottie Glazier, MD;  Location: [facility] ORS;  Service: Thoracic;  Laterality: N/A;    FAMILY HISTORY:  We obtained a detailed, 4-generation family history.  Significant diagnoses are listed below: Family History  Problem Relation Age of Onset   Ovarian cancer Mother    Colon cancer Father  Diabetes Father    Jose Morse has 1 son, 33, and 1 daughter, 51. He has 1 sister and 1 brother.  Jose Morse mother had ovarian cancer in her 11s and is living at 105. Patient has 9 maternal aunts/uncles. One uncle and one aunt had cancer, unknown exact types. Maternal  grandmother passed in her 47s, grandfather passed at 36.  Jose Morse father had colon cancer in his 61s and passed at 47. No other known cancers on this side of the family.  Jose Morse is unaware of previous family history of genetic testing for hereditary cancer risks. There is no reported Ashkenazi Jewish ancestry. There is no known consanguinity.    GENETIC COUNSELING ASSESSMENT: Jose Morse is a 51 y.o. male with a family history of ovarian cancer which is somewhat suggestive of a hereditary cancer syndrome and predisposition to cancer. We, therefore, discussed and recommended the following at today's visit.   DISCUSSION: We discussed that approximately 10-20% of ovarian cancer is hereditary. Most cases of hereditary ovarian cancer are associated with BRCA1/BRCA2 genes, although there are other genes associated with hereditary cancer as well, including EGFR which is associated with hereditary lung cancer.  Cancers and risks are gene specific. We discussed that testing is beneficial for several reasons including knowing about cancer risks, identifying potential screening and risk-reduction options that may be appropriate, and to understand if other family members could be at risk for cancer and allow them to undergo genetic testing.   We reviewed the characteristics, features and inheritance patterns of hereditary cancer syndromes. We also discussed genetic testing, including the appropriate family members to test, the process of testing, insurance coverage and turn-around-time for results. We discussed the implications of a negative, positive and/or variant of uncertain significant result. We recommended Jose Morse pursue genetic testing for the [facility] CancerNext-Expanded+RNA gene panel.   The CancerNext-Expanded + RNAinsight gene panel offered by [employer] and includes sequencing and rearrangement analysis for the following 77 genes: IP, ALK, APC*, ATM*, AXIN2, BAP1, BARD1, BLM, BMPR1A,  BRCA1*, BRCA2*, BRIP1*, CDC73, CDH1*,CDK4, CDKN1B, CDKN2A, CHEK2*, CTNNA1, DICER1, FANCC, FH, FLCN, GALNT12, KIF1B, LZTR1, MAX, MEN1, MET, MLH1*, MSH2*, MSH3, MSH6*, MUTYH*, NBN, NF1*, NF2, NTHL1, PALB2*, PHOX2B, PMS2*, POT1, PRKAR1A, PTCH1, PTEN*, RAD51C*, RAD51D*,RB1, RECQL, RET, SDHA, SDHAF2, SDHB, SDHC, SDHD, SMAD4, SMARCA4, SMARCB1, SMARCE1, STK11, SUFU, TMEM127, TP53*,TSC1, TSC2, VHL and XRCC2 (sequencing and deletion/duplication); EGFR, EGLN1, HOXB13, KIT, MITF, PDGFRA, POLD1 and POLE (sequencing only); EPCAM and GREM1 (deletion/duplication only).  Based on Jose Morse family history of cancer, he meets medical criteria for genetic testing. Despite that he meets criteria, he may still have an out of pocket cost. We discussed that if his out of pocket cost for testing is over $100, the laboratory will call and confirm whether he wants to proceed with testing.  If the out of pocket cost of testing is less than $100 he will be billed by the genetic testing laboratory.   PLAN: After considering the risks, benefits, and limitations, Jose Morse provided informed consent to pursue genetic testing and the blood sample was sent to [facility] for analysis of the CancerNext-Expanded+RNA panel. Results should be available within approximately 2-3 weeks' time, at which point they will be disclosed by telephone to Jose Morse, as will any additional recommendations warranted by these results. Jose Morse will receive a summary of his genetic counseling visit and a copy of his results once available. This information will also be available in Epic.   Jose Morse questions were  answered to his satisfaction today. Our contact information was provided should additional questions or concerns arise. Thank you for the referral and allowing [place] to share in the care of your patient.   Faith Rogue, MS, [facility] Genetic Counselor [email or web address] Phone: [phone]  The patient was seen for a  total of 25 minutes in face-to-face genetic counseling.  Dr. Grayland Ormond was available for discussion regarding this case.   _______________________________________________________________________ For Office Staff:  Number of people involved in session: 1 Was an Intern/ student involved with case: no

## 2022-03-31 MED FILL — Dexamethasone Sodium Phosphate Inj 100 MG/10ML: INTRAMUSCULAR | Qty: 1 | Status: AC

## 2022-03-31 MED FILL — Fosaprepitant Dimeglumine For IV Infusion 150 MG (Base Eq): INTRAVENOUS | Qty: 5 | Status: AC

## 2022-04-03 ENCOUNTER — Inpatient Hospital Stay: Payer: BC Managed Care – PPO

## 2022-04-03 ENCOUNTER — Inpatient Hospital Stay (HOSPITAL_BASED_OUTPATIENT_CLINIC_OR_DEPARTMENT_OTHER): Payer: BC Managed Care – PPO | Admitting: Internal Medicine

## 2022-04-03 ENCOUNTER — Encounter: Payer: Self-pay | Admitting: Internal Medicine

## 2022-04-03 ENCOUNTER — Other Ambulatory Visit: Payer: Self-pay

## 2022-04-03 VITALS — BP 127/88 | HR 89 | Temp 97.2°F | Ht 67.0 in | Wt 149.4 lb

## 2022-04-03 VITALS — BP 153/88 | HR 68 | Resp 18

## 2022-04-03 DIAGNOSIS — F17210 Nicotine dependence, cigarettes, uncomplicated: Secondary | ICD-10-CM | POA: Diagnosis not present

## 2022-04-03 DIAGNOSIS — C3490 Malignant neoplasm of unspecified part of unspecified bronchus or lung: Secondary | ICD-10-CM

## 2022-04-03 DIAGNOSIS — R062 Wheezing: Secondary | ICD-10-CM | POA: Diagnosis not present

## 2022-04-03 DIAGNOSIS — Z5112 Encounter for antineoplastic immunotherapy: Secondary | ICD-10-CM | POA: Diagnosis present

## 2022-04-03 DIAGNOSIS — J449 Chronic obstructive pulmonary disease, unspecified: Secondary | ICD-10-CM | POA: Diagnosis not present

## 2022-04-03 DIAGNOSIS — E876 Hypokalemia: Secondary | ICD-10-CM | POA: Diagnosis not present

## 2022-04-03 DIAGNOSIS — C7931 Secondary malignant neoplasm of brain: Secondary | ICD-10-CM

## 2022-04-03 DIAGNOSIS — C3412 Malignant neoplasm of upper lobe, left bronchus or lung: Secondary | ICD-10-CM

## 2022-04-03 DIAGNOSIS — C7951 Secondary malignant neoplasm of bone: Secondary | ICD-10-CM | POA: Diagnosis not present

## 2022-04-03 DIAGNOSIS — Z5111 Encounter for antineoplastic chemotherapy: Secondary | ICD-10-CM | POA: Diagnosis present

## 2022-04-03 DIAGNOSIS — R5383 Other fatigue: Secondary | ICD-10-CM | POA: Diagnosis not present

## 2022-04-03 DIAGNOSIS — Z800 Family history of malignant neoplasm of digestive organs: Secondary | ICD-10-CM | POA: Diagnosis not present

## 2022-04-03 DIAGNOSIS — R7401 Elevation of levels of liver transaminase levels: Secondary | ICD-10-CM | POA: Diagnosis not present

## 2022-04-03 DIAGNOSIS — Z5189 Encounter for other specified aftercare: Secondary | ICD-10-CM | POA: Diagnosis not present

## 2022-04-03 DIAGNOSIS — Z8041 Family history of malignant neoplasm of ovary: Secondary | ICD-10-CM | POA: Diagnosis not present

## 2022-04-03 LAB — CBC WITH DIFFERENTIAL/PLATELET
Abs Immature Granulocytes: 0.8 10*3/uL — ABNORMAL HIGH (ref 0.00–0.07)
Basophils Absolute: 0.1 10*3/uL (ref 0.0–0.1)
Basophils Relative: 1 %
Eosinophils Absolute: 0.1 10*3/uL (ref 0.0–0.5)
Eosinophils Relative: 1 %
HCT: 31.6 % — ABNORMAL LOW (ref 39.0–52.0)
Hemoglobin: 10.4 g/dL — ABNORMAL LOW (ref 13.0–17.0)
Immature Granulocytes: 7 %
Lymphocytes Relative: 14 %
Lymphs Abs: 1.7 10*3/uL (ref 0.7–4.0)
MCH: 30.9 pg (ref 26.0–34.0)
MCHC: 32.9 g/dL (ref 30.0–36.0)
MCV: 93.8 fL (ref 80.0–100.0)
Monocytes Absolute: 1.2 10*3/uL — ABNORMAL HIGH (ref 0.1–1.0)
Monocytes Relative: 10 %
Neutro Abs: 8.3 10*3/uL — ABNORMAL HIGH (ref 1.7–7.7)
Neutrophils Relative %: 67 %
Platelets: 223 10*3/uL (ref 150–400)
RBC: 3.37 MIL/uL — ABNORMAL LOW (ref 4.22–5.81)
RDW: 16.8 % — ABNORMAL HIGH (ref 11.5–15.5)
Smear Review: NORMAL
WBC: 12.2 10*3/uL — ABNORMAL HIGH (ref 4.0–10.5)
nRBC: 0.9 % — ABNORMAL HIGH (ref 0.0–0.2)

## 2022-04-03 LAB — COMPREHENSIVE METABOLIC PANEL
ALT: 54 U/L — ABNORMAL HIGH (ref 0–44)
AST: 33 U/L (ref 15–41)
Albumin: 3.7 g/dL (ref 3.5–5.0)
Alkaline Phosphatase: 122 U/L (ref 38–126)
Anion gap: 6 (ref 5–15)
BUN: 11 mg/dL (ref 6–20)
CO2: 24 mmol/L (ref 22–32)
Calcium: 8.8 mg/dL — ABNORMAL LOW (ref 8.9–10.3)
Chloride: 107 mmol/L (ref 98–111)
Creatinine, Ser: 0.78 mg/dL (ref 0.61–1.24)
GFR, Estimated: >60 mL/min (ref >60)
Glucose, Bld: 121 mg/dL — ABNORMAL HIGH (ref 70–99)
Potassium: 4 mmol/L (ref 3.5–5.1)
Sodium: 137 mmol/L (ref 135–145)
Total Bilirubin: 0.8 mg/dL (ref 0.3–1.2)
Total Protein: 7.4 g/dL (ref 6.5–8.1)

## 2022-04-03 LAB — TSH: TSH: 2.159 u[IU]/mL (ref 0.350–4.500)

## 2022-04-03 MED ORDER — SODIUM CHLORIDE 0.9% FLUSH
10.0000 mL | Freq: Once | INTRAVENOUS | Status: AC
  Administered 2022-04-03: 10 mL via INTRAVENOUS
  Filled 2022-04-03: qty 10

## 2022-04-03 MED ORDER — PALONOSETRON HCL INJECTION 0.25 MG/5ML
0.2500 mg | Freq: Once | INTRAVENOUS | Status: AC
  Administered 2022-04-03: 0.25 mg via INTRAVENOUS
  Filled 2022-04-03: qty 5

## 2022-04-03 MED ORDER — HEPARIN SOD (PORK) LOCK FLUSH 100 UNIT/ML IV SOLN
500.0000 [IU] | Freq: Once | INTRAVENOUS | Status: AC
  Filled 2022-04-03: qty 5

## 2022-04-03 MED ORDER — SODIUM CHLORIDE 0.9 % IV SOLN
150.0000 mg | Freq: Once | INTRAVENOUS | Status: AC
  Administered 2022-04-03: 150 mg via INTRAVENOUS
  Filled 2022-04-03: qty 150

## 2022-04-03 MED ORDER — SODIUM CHLORIDE 0.9% FLUSH
10.0000 mL | INTRAVENOUS | Status: DC | PRN
  Administered 2022-04-03: 10 mL
  Filled 2022-04-03: qty 10

## 2022-04-03 MED ORDER — SODIUM CHLORIDE 0.9 % IV SOLN
100.0000 mg/m2 | Freq: Once | INTRAVENOUS | Status: AC
  Administered 2022-04-03: 180 mg via INTRAVENOUS
  Filled 2022-04-03: qty 9

## 2022-04-03 MED ORDER — SODIUM CHLORIDE 0.9 % IV SOLN
1200.0000 mg | Freq: Once | INTRAVENOUS | Status: AC
  Administered 2022-04-03: 1200 mg via INTRAVENOUS
  Filled 2022-04-03: qty 20

## 2022-04-03 MED ORDER — HEPARIN SOD (PORK) LOCK FLUSH 100 UNIT/ML IV SOLN
500.0000 [IU] | Freq: Once | INTRAVENOUS | Status: AC | PRN
  Administered 2022-04-03: 500 [IU]
  Filled 2022-04-03: qty 5

## 2022-04-03 MED ORDER — SODIUM CHLORIDE 0.9 % IV SOLN
610.0000 mg | Freq: Once | INTRAVENOUS | Status: AC
  Administered 2022-04-03: 610 mg via INTRAVENOUS
  Filled 2022-04-03: qty 61

## 2022-04-03 MED ORDER — SODIUM CHLORIDE 0.9 % IV SOLN
Freq: Once | INTRAVENOUS | Status: AC
  Filled 2022-04-03: qty 250

## 2022-04-03 MED ORDER — SODIUM CHLORIDE 0.9 % IV SOLN
10.0000 mg | Freq: Once | INTRAVENOUS | Status: AC
  Administered 2022-04-03: 10 mg via INTRAVENOUS
  Filled 2022-04-03: qty 10

## 2022-04-03 NOTE — Progress Notes (Signed)
[place] CONSULT NOTE  Patient Care Team: Tracie Harrier, MD as PCP - General (Internal Medicine) Telford Nab, RN as Oncology Nurse Navigator Cammie Sickle, MD as Consulting Physician (Oncology)  CHIEF COMPLAINTS/PURPOSE OF CONSULTATION: lung cancer  #  Oncology History Overview Note  # . LUNG, LEFT UPPER LOBE; ENB-GUIDED TRANSBRONCHIAL FORCEPS BIOPSY: - SMALL CELL CARCINOMA.  [Dr.Aleskerov]  Comment:  Invasive carcinoma is present in two fragments, with crush artifact. The  cells are small, with scant cytoplasm, nuclear molding, and  inconspicuous nucleoli. There is necrosis.   Immunohistochemistry (IHC) was performed for further characterization.  The neoplastic cells are positive for CD56 with strong staining of over  90% of cells. They are negative for TTF-1 and p40.    IMPRESSION: 1. LEFT suprahilar mass constricting LEFT upper lobe bronchus consistent with primary bronchogenic carcinoma. 2. Ipsilateral mediastinal nodal metastasis. 3. Distant visceral metastasis to the LIVER and bilateral ADRENAL GLANDS. 4. Distant nodal metastasis to the LEFT external iliac node. 5. Multifocal hypermetabolic skeletal metastasis (approximately 40 lesions). Lesions are occult by CT imaging. 6. Focal activity beneath the skin posterior to the RIGHT ear. Differential includes nodal metastasis versus primary parotid neoplasm. Favor nodal metastasis. (Recommend attention on brain MRI workup).     Electronically Signed   By: Suzy Bouchard M.D.   On: 02/06/2022 13:00  # April 2023-brain MRI incidental/screening 4 mm x 2 cerebellar lesions.  # MAY 1st, 2023- carboo-Etop [d-1-3]; udenyca   Primary cancer of left upper lobe of lung ([place])  02/16/2022 Initial Diagnosis   Primary cancer of left upper lobe of lung ([place])   02/16/2022 Cancer Staging   Staging form: Lung, AJCC 8th Edition - Clinical: Stage IVB (cT3, cN2, pM1c) - Signed by Cammie Sickle,  MD on 02/16/2022 Stage prefix: Initial diagnosis   02/20/2022 -  Chemotherapy   Patient is on Treatment Plan : LUNG SCLC Carboplatin + Etoposide + Atezolizumab Induction q21d / Atezolizumab Maintenance q21d        HISTORY OF PRESENTING ILLNESS: Ambulating independently.  Alone.   Jose Morse 51 y.o.  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  Denies any worsening bone pain.  He is taking tramadol 1 pill every other day.  Not needing oxycodone.   Denies any worsening shortness of breath or cough.  Denies hemoptysis.  Denies any worsening headaches.  Appetite is good.  No weight loss.   Review of Systems  Constitutional:  Positive for malaise/fatigue. Negative for chills, diaphoresis, fever and weight loss.  HENT:  Negative for nosebleeds and sore throat.   Eyes:  Negative for double vision.  Respiratory:  Positive for wheezing. Negative for cough, hemoptysis, sputum production and shortness of breath.   Cardiovascular:  Positive for chest pain. Negative for palpitations, orthopnea and leg swelling.  Gastrointestinal:  Negative for abdominal pain, blood in stool, constipation, diarrhea, heartburn, melena, nausea and vomiting.  Genitourinary:  Negative for dysuria, frequency and urgency.  Musculoskeletal:  Positive for back pain and joint pain.  Skin: Negative.  Negative for itching and rash.  Neurological:  Negative for dizziness, tingling, focal weakness, weakness and headaches.  Endo/Heme/Allergies:  Does not bruise/bleed easily.  Psychiatric/Behavioral:  Negative for depression. The patient is not nervous/anxious and does not have insomnia.      MEDICAL HISTORY:  Past Medical History:  Diagnosis Date   History of kidney stones    Hypercholesteremia     SURGICAL  HISTORY: Past Surgical History:  Procedure Laterality Date   CHOLECYSTECTOMY     IR IMAGING GUIDED PORT INSERTION  02/23/2022   VIDEO  BRONCHOSCOPY WITH ENDOBRONCHIAL NAVIGATION N/A 02/10/2022   Procedure: VIDEO BRONCHOSCOPY WITH ENDOBRONCHIAL NAVIGATION;  Surgeon: Ottie Glazier, MD;  Location: [facility] ORS;  Service: Thoracic;  Laterality: N/A;   VIDEO BRONCHOSCOPY WITH ENDOBRONCHIAL ULTRASOUND N/A 02/10/2022   Procedure: VIDEO BRONCHOSCOPY WITH ENDOBRONCHIAL ULTRASOUND;  Surgeon: Ottie Glazier, MD;  Location: [facility] ORS;  Service: Thoracic;  Laterality: N/A;    SOCIAL HISTORY: Social History   Socioeconomic History   Marital status: Married    Spouse name: Not on file   Number of children: Not on file   Years of education: Not on file   Highest education level: Not on file  Occupational History   Not on file  Tobacco Use   Smoking status: Every Day    Packs/day: 0.50    Years: 34.00    Total pack years: 17.00    Types: Cigarettes   Smokeless tobacco: Never  Vaping Use   Vaping Use: Never used  Substance and Sexual Activity   Alcohol use: No   Drug use: Not Currently    Types: Marijuana   Sexual activity: Yes  Other Topics Concern   Not on file  Social History Narrative   Lives in [place]; with wife/son- 18 y [2023]; works in Architect. Smoker; no alcohol.    Social Determinants of Health   Financial Resource Strain: Not on file  Food Insecurity: Not on file  Transportation Needs: Not on file  Physical Activity: Not on file  Stress: Not on file  Social Connections: Not on file  Intimate Partner Violence: Not on file    FAMILY HISTORY: Family History  Problem Relation Age of Onset   Ovarian cancer Mother    Colon cancer Father    Diabetes Father     ALLERGIES:  is allergic to codeine.  MEDICATIONS:  Current Outpatient Medications  Medication Sig Dispense Refill   acetaminophen-codeine (TYLENOL #3) 300-30 MG tablet Take 1 tablet by mouth every 4 (four) hours as needed for moderate pain.     albuterol (PROVENTIL HFA) 108 (90 Base) MCG/ACT inhaler Inhale 2 puffs into the lungs every 4 (four)  hours as needed for wheezing or shortness of breath. 1 each 0   Budeson-Glycopyrrol-Formoterol (BREZTRI AEROSPHERE) 160-9-4.8 MCG/ACT AERO Inhale 2 puffs into the lungs in the morning and at bedtime.     lidocaine-prilocaine (EMLA) cream Apply on the port. 30 -45 min  prior to port access. 30 g 3   ondansetron (ZOFRAN) 8 MG tablet One pill every 8 hours as needed for nausea/vomitting. 40 tablet 1   oxyCODONE-acetaminophen (PERCOCET/ROXICET) 5-325 MG tablet Take 1 tablet by mouth every 6 (six) hours as needed for up to 15 days for severe pain. 60 tablet 0   potassium chloride SA (KLOR-CON M) 20 MEQ tablet 1 pill twice a day 60 tablet 3   prochlorperazine (COMPAZINE) 10 MG tablet Take 1 tablet (10 mg total) by mouth every 6 (six) hours as needed for nausea or vomiting. 40 tablet 1   rosuvastatin (CRESTOR) 5 MG tablet Take 5 mg by mouth at bedtime.     traMADol (ULTRAM) 50 MG tablet Take 1 tablet (50 mg total) by mouth every 6 (six) hours as needed. 60 tablet 0   No current facility-administered medications for this visit.   Facility-Administered Medications Ordered in Other Visits  Medication Dose Route Frequency Provider Last  Rate Last Admin   heparin lock flush 100 unit/mL  500 Units Intravenous Once Charlaine Dalton R, MD          Left chest wall skin rash anterior-maculopapular skin rash.;  Not in dermatomal distribution.  PHYSICAL EXAMINATION: ECOG PERFORMANCE STATUS: 1 - Symptomatic but completely ambulatory  Vitals:   04/03/22 0818  BP: 127/88  Pulse: 89  Temp: (!) 97.2 F (36.2 C)  SpO2: 99%   Filed Weights   04/03/22 0818  Weight: 149 lb 6.4 oz (67.8 kg)    Physical Exam Vitals and nursing note reviewed.  HENT:     Head: Normocephalic and atraumatic.     Mouth/Throat:     Pharynx: Oropharynx is clear.  Eyes:     Extraocular Movements: Extraocular movements intact.     Pupils: Pupils are equal, round, and reactive to light.  Cardiovascular:     Rate and Rhythm:  Normal rate and regular rhythm.  Pulmonary:     Comments: Decreased breath sounds bilaterally.  Abdominal:     Palpations: Abdomen is soft.  Musculoskeletal:        General: Normal range of motion.     Cervical back: Normal range of motion.  Skin:    General: Skin is warm.  Neurological:     General: No focal deficit present.     Mental Status: He is alert and oriented to person, place, and time.  Psychiatric:        Behavior: Behavior normal.        Judgment: Judgment normal.      LABORATORY DATA:  I have reviewed the data as listed Lab Results  Component Value Date   WBC 12.2 (H) 04/03/2022   HGB 10.4 (L) 04/03/2022   HCT 31.6 (L) 04/03/2022   MCV 93.8 04/03/2022   PLT 223 04/03/2022   Recent Labs    03/13/22 0830 03/21/22 1254 04/03/22 0758  NA 137 134* 137  K 3.1* 3.8 4.0  CL 106 103 107  CO2 24 25 24   GLUCOSE 142* 118* 121*  BUN 12 13 11   CREATININE 0.87 0.69 0.78  CALCIUM 8.4* 8.9 8.8*  GFRNONAA >60 >60 >60  PROT 7.3 6.8 7.4  ALBUMIN 3.6 3.7 3.7  AST 24 21 33  ALT 36 37 54*  ALKPHOS 144* 169* 122  BILITOT 0.9 0.5 0.8    RADIOGRAPHIC STUDIES: I have personally reviewed the radiological images as listed and agreed with the findings in the report. No results found.  ASSESSMENT & PLAN:   Primary cancer of left upper lobe of lung ([place]) #Extensive stage small cell lung cancer /stage IV. April 2023-brain MRI-4 mm x 2 metastases noted.  Currently on [place] etoposide-Tecentriq every 3 weeks x 4 cycles; followed by maintenance Tecentriq post chemotherapy.   #Proceed with cycle #3-  of carboplatin etoposide-Tecentriq; labs today reviewed;  acceptable for treatment today-except for elevated LFTs see below.  Will order today.   #Hypokalemia potassium 3.1 secondary chemotherapy--STABLE- cut to K-Dur one a day.  # #Brain metastases : April 2023-MRI brain 4 mm x 2 [asymptomatic/screening MRI]-recommend holding off radiation.  Will order imaging today.    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 s/p Zometa-4 mg IV every  6 weeks.   #Left chest wall skin rash anterior-appears allergic; no concerns for shingles.  Recommend hydrocortisone ointment.  # Smoking: Active smoker;  the process of  quitting.  # COPD: Continue inhalers [Dr.Aleskerov]- STABLE.   # DISPOSITION: # chemo today and chemo this week as planned;  D-4 Udenyca  # follow up in 3 weeks with MD-  d-1 of chemo; carbo-etop-tecentriq; Zometa;  ; d-2 etop; d-3 etop;possible Zometa; D-4 Udenyca;CT CAP; MRI Brain - Dr.B.     All questions were answered. The patient knows to call the clinic with any problems, questions or concerns.       Cammie Sickle, MD 04/03/2022 8:53 AM

## 2022-04-03 NOTE — Assessment & Plan Note (Signed)
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 April 2023-brain MRI-4 mm x 2 metastases noted.  Currently on [place] etoposide-Tecentriq every 3 weeks x 4 cycles; followed by maintenance Tecentriq post chemotherapy.   #Proceed with cycle #3-  of carboplatin etoposide-Tecentriq; labs today reviewed;  acceptable for treatment today-except for elevated LFTs see below.  Will order today.   #Hypokalemia potassium 3.1 secondary chemotherapy--STABLE- cut to K-Dur one a day.  # #Brain metastases : April 2023-MRI brain 4 mm x 2 [asymptomatic/screening MRI]-recommend holding off radiation.  Will order imaging today.   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 s/p Zometa-4 mg IV every  6 weeks.   #Left chest wall skin rash anterior-appears allergic; no concerns for shingles.  Recommend hydrocortisone ointment.  # Smoking: Active smoker;  the process of quitting.  # COPD: Continue inhalers [Dr.Aleskerov]- STABLE.   # DISPOSITION: # chemo today and chemo this week as planned;  D-4 Udenyca  # follow up in 3 weeks with MD-  d-1 of chemo; carbo-etop-tecentriq; Zometa;  ; d-2 etop; d-3 etop;possible Zometa; D-4 Udenyca;CT CAP; MRI Brain - Dr.B.

## 2022-04-03 NOTE — Patient Instructions (Signed)
[facility] AT [place]  Discharge Instructions: Thank you for choosing [facility] to provide your oncology and hematology care.  If you have a lab appointment with the [facility], please go directly to the [facility] and check in at the registration area.  Wear comfortable clothing and clothing appropriate for easy access to any Portacath or PICC line.   We strive to give you quality time with your provider. You may need to reschedule your appointment if you arrive late (15 or more minutes).  Arriving late affects you and other patients whose appointments are after yours.  Also, if you miss three or more appointments without notifying the office, you may be dismissed from the clinic at the provider's discretion.      For prescription refill requests, have your pharmacy contact our office and allow 72 hours for refills to be completed.    Today you received the following chemotherapy and/or immunotherapy agents Tecentriq, Etoposide, and Carboplatin.      To help prevent nausea and vomiting after your treatment, we encourage you to take your nausea medication as directed.  BELOW ARE SYMPTOMS THAT SHOULD BE REPORTED IMMEDIATELY: *FEVER GREATER THAN 100.4 F (38 C) OR HIGHER *CHILLS OR SWEATING *NAUSEA AND VOMITING THAT IS NOT CONTROLLED WITH YOUR NAUSEA MEDICATION *UNUSUAL SHORTNESS OF BREATH *UNUSUAL BRUISING OR BLEEDING *URINARY PROBLEMS (pain or burning when urinating, or frequent urination) *BOWEL PROBLEMS (unusual diarrhea, constipation, pain near the anus) TENDERNESS IN MOUTH AND THROAT WITH OR WITHOUT PRESENCE OF ULCERS (sore throat, sores in mouth, or a toothache) UNUSUAL RASH, SWELLING OR PAIN  UNUSUAL VAGINAL DISCHARGE OR ITCHING   Items with * indicate a potential emergency and should be followed up as soon as possible or go to the Emergency Department if any problems should occur.  Please show the CHEMOTHERAPY ALERT CARD or  IMMUNOTHERAPY ALERT CARD at check-in to the Emergency Department and triage nurse.  Should you have questions after your visit or need to cancel or reschedule your appointment, please contact [facility] [place] [facility] [place]  [phone] and follow the prompts.  Office hours are 8:00 a.m. to 4:30 p.m. Monday - Friday. Please note that voicemails left after 4:00 p.m. may not be returned until the following business day.  We are closed weekends and major holidays. You have access to a nurse at all times for urgent questions. Please call the main number to the clinic [phone] and follow the prompts.  For any non-urgent questions, you may also contact your provider using MyChart. We now offer e-Visits for anyone 87 and older to request care online for non-urgent symptoms. For details visit [email or web address].   Also download the MyChart app! Go to the app store, search "MyChart", open the app, select [facility], and log in with your MyChart username and password.  Due to Covid, a mask is required upon entering the hospital/clinic. If you do not have a mask, one will be given to you upon arrival. For doctor visits, patients may have 1 support person aged 24 or older with them. For treatment visits, patients cannot have anyone with them due to current Covid guidelines and our immunocompromised population.

## 2022-04-04 ENCOUNTER — Ambulatory Visit: Payer: BC Managed Care – PPO

## 2022-04-04 ENCOUNTER — Inpatient Hospital Stay: Payer: BC Managed Care – PPO

## 2022-04-04 VITALS — BP 123/57 | HR 65 | Temp 97.0°F | Resp 20

## 2022-04-04 DIAGNOSIS — Z5112 Encounter for antineoplastic immunotherapy: Secondary | ICD-10-CM | POA: Diagnosis not present

## 2022-04-04 DIAGNOSIS — C3412 Malignant neoplasm of upper lobe, left bronchus or lung: Secondary | ICD-10-CM

## 2022-04-04 MED ORDER — SODIUM CHLORIDE 0.9% FLUSH
10.0000 mL | INTRAVENOUS | Status: DC | PRN
  Administered 2022-04-04: 10 mL
  Filled 2022-04-04: qty 10

## 2022-04-04 MED ORDER — HEPARIN SOD (PORK) LOCK FLUSH 100 UNIT/ML IV SOLN
500.0000 [IU] | Freq: Once | INTRAVENOUS | Status: AC | PRN
  Administered 2022-04-04: 500 [IU]
  Filled 2022-04-04: qty 5

## 2022-04-04 MED ORDER — SODIUM CHLORIDE 0.9 % IV SOLN
Freq: Once | INTRAVENOUS | Status: AC
  Filled 2022-04-04: qty 250

## 2022-04-04 MED ORDER — SODIUM CHLORIDE 0.9 % IV SOLN
100.0000 mg/m2 | Freq: Once | INTRAVENOUS | Status: AC
  Administered 2022-04-04: 180 mg via INTRAVENOUS
  Filled 2022-04-04: qty 9

## 2022-04-04 MED ORDER — SODIUM CHLORIDE 0.9 % IV SOLN
10.0000 mg | Freq: Once | INTRAVENOUS | Status: AC
  Administered 2022-04-04: 10 mg via INTRAVENOUS
  Filled 2022-04-04: qty 10

## 2022-04-04 MED FILL — Dexamethasone Sodium Phosphate Inj 100 MG/10ML: INTRAMUSCULAR | Qty: 1 | Status: AC

## 2022-04-04 NOTE — Patient Instructions (Signed)
[facility] CANCER CTR AT [place]  Discharge Instructions: Thank you for choosing [facility] to provide your oncology and hematology care.  If you have a lab appointment with the [facility], please go directly to the [facility] and check in at the registration area.  Wear comfortable clothing and clothing appropriate for easy access to any Portacath or PICC line.   We strive to give you quality time with your provider. You may need to reschedule your appointment if you arrive late (15 or more minutes).  Arriving late affects you and other patients whose appointments are after yours.  Also, if you miss three or more appointments without notifying the office, you may be dismissed from the clinic at the provider's discretion.      For prescription refill requests, have your pharmacy contact our office and allow 72 hours for refills to be completed.    Today you received the following chemotherapy and/or immunotherapy agents: Etoposide      To help prevent nausea and vomiting after your treatment, we encourage you to take your nausea medication as directed.  BELOW ARE SYMPTOMS THAT SHOULD BE REPORTED IMMEDIATELY: *FEVER GREATER THAN 100.4 F (38 C) OR HIGHER *CHILLS OR SWEATING *NAUSEA AND VOMITING THAT IS NOT CONTROLLED WITH YOUR NAUSEA MEDICATION *UNUSUAL SHORTNESS OF BREATH *UNUSUAL BRUISING OR BLEEDING *URINARY PROBLEMS (pain or burning when urinating, or frequent urination) *BOWEL PROBLEMS (unusual diarrhea, constipation, pain near the anus) TENDERNESS IN MOUTH AND THROAT WITH OR WITHOUT PRESENCE OF ULCERS (sore throat, sores in mouth, or a toothache) UNUSUAL RASH, SWELLING OR PAIN  UNUSUAL VAGINAL DISCHARGE OR ITCHING   Items with * indicate a potential emergency and should be followed up as soon as possible or go to the Emergency Department if any problems should occur.  Please show the CHEMOTHERAPY ALERT CARD or IMMUNOTHERAPY ALERT CARD at check-in to  the Emergency Department and triage nurse.  Should you have questions after your visit or need to cancel or reschedule your appointment, please contact [facility] CANCER [place] AT [place]  [phone] and follow the prompts.  Office hours are 8:00 a.m. to 4:30 p.m. Monday - Friday. Please note that voicemails left after 4:00 p.m. may not be returned until the following business day.  We are closed weekends and major holidays. You have access to a nurse at all times for urgent questions. Please call the main number to the clinic [phone] and follow the prompts.  For any non-urgent questions, you may also contact your provider using MyChart. We now offer e-Visits for anyone 50 and older to request care online for non-urgent symptoms. For details visit [email or web address].   Also download the MyChart app! Go to the app store, search "MyChart", open the app, select [facility], and log in with your MyChart username and password.  Masks are optional in the cancer centers. If you would like for your care team to wear a mask while they are taking care of you, please let them know. For doctor visits, patients may have with them one support person who is at least 50 years old. At this time, visitors are not allowed in the infusion area.

## 2022-04-05 ENCOUNTER — Inpatient Hospital Stay: Payer: BC Managed Care – PPO

## 2022-04-05 ENCOUNTER — Ambulatory Visit: Payer: BC Managed Care – PPO

## 2022-04-05 VITALS — BP 136/83 | HR 61 | Temp 96.3°F | Resp 18

## 2022-04-05 DIAGNOSIS — C3412 Malignant neoplasm of upper lobe, left bronchus or lung: Secondary | ICD-10-CM

## 2022-04-05 DIAGNOSIS — Z5112 Encounter for antineoplastic immunotherapy: Secondary | ICD-10-CM | POA: Diagnosis not present

## 2022-04-05 MED ORDER — ZOLEDRONIC ACID 4 MG/100ML IV SOLN
4.0000 mg | Freq: Once | INTRAVENOUS | Status: AC
  Administered 2022-04-05: 4 mg via INTRAVENOUS
  Filled 2022-04-05: qty 100

## 2022-04-05 MED ORDER — HEPARIN SOD (PORK) LOCK FLUSH 100 UNIT/ML IV SOLN
500.0000 [IU] | Freq: Once | INTRAVENOUS | Status: AC | PRN
  Administered 2022-04-05: 500 [IU]
  Filled 2022-04-05: qty 5

## 2022-04-05 MED ORDER — SODIUM CHLORIDE 0.9 % IV SOLN
Freq: Once | INTRAVENOUS | Status: AC
  Filled 2022-04-05: qty 250

## 2022-04-05 MED ORDER — SODIUM CHLORIDE 0.9 % IV SOLN
100.0000 mg/m2 | Freq: Once | INTRAVENOUS | Status: AC
  Administered 2022-04-05: 180 mg via INTRAVENOUS
  Filled 2022-04-05: qty 9

## 2022-04-05 MED ORDER — SODIUM CHLORIDE 0.9 % IV SOLN
10.0000 mg | Freq: Once | INTRAVENOUS | Status: AC
  Administered 2022-04-05: 10 mg via INTRAVENOUS
  Filled 2022-04-05: qty 10

## 2022-04-05 NOTE — Patient Instructions (Signed)
[facility] CANCER CTR AT [place]  Discharge Instructions: Thank you for choosing [facility] to provide your oncology and hematology care.  If you have a lab appointment with the [facility], please go directly to the [facility] and check in at the registration area.  Wear comfortable clothing and clothing appropriate for easy access to any Portacath or PICC line.   We strive to give you quality time with your provider. You may need to reschedule your appointment if you arrive late (15 or more minutes).  Arriving late affects you and other patients whose appointments are after yours.  Also, if you miss three or more appointments without notifying the office, you may be dismissed from the clinic at the provider's discretion.      For prescription refill requests, have your pharmacy contact our office and allow 72 hours for refills to be completed.    Today you received the following chemotherapy and/or immunotherapy agents ETOPOSIDE and ZOMETA      To help prevent nausea and vomiting after your treatment, we encourage you to take your nausea medication as directed.  BELOW ARE SYMPTOMS THAT SHOULD BE REPORTED IMMEDIATELY: *FEVER GREATER THAN 100.4 F (38 C) OR HIGHER *CHILLS OR SWEATING *NAUSEA AND VOMITING THAT IS NOT CONTROLLED WITH YOUR NAUSEA MEDICATION *UNUSUAL SHORTNESS OF BREATH *UNUSUAL BRUISING OR BLEEDING *URINARY PROBLEMS (pain or burning when urinating, or frequent urination) *BOWEL PROBLEMS (unusual diarrhea, constipation, pain near the anus) TENDERNESS IN MOUTH AND THROAT WITH OR WITHOUT PRESENCE OF ULCERS (sore throat, sores in mouth, or a toothache) UNUSUAL RASH, SWELLING OR PAIN  UNUSUAL VAGINAL DISCHARGE OR ITCHING   Items with * indicate a potential emergency and should be followed up as soon as possible or go to the Emergency Department if any problems should occur.  Please show the CHEMOTHERAPY ALERT CARD or IMMUNOTHERAPY ALERT CARD at  check-in to the Emergency Department and triage nurse.  Should you have questions after your visit or need to cancel or reschedule your appointment, please contact [facility] CANCER [place] AT [place]  [phone] and follow the prompts.  Office hours are 8:00 a.m. to 4:30 p.m. Monday - Friday. Please note that voicemails left after 4:00 p.m. may not be returned until the following business day.  We are closed weekends and major holidays. You have access to a nurse at all times for urgent questions. Please call the main number to the clinic [phone] and follow the prompts.  For any non-urgent questions, you may also contact your provider using MyChart. We now offer e-Visits for anyone 41 and older to request care online for non-urgent symptoms. For details visit [email or web address].   Also download the MyChart app! Go to the app store, search "MyChart", open the app, select [facility], and log in with your MyChart username and password.  Masks are optional in the cancer centers. If you would like for your care team to wear a mask while they are taking care of you, please let them know. For doctor visits, patients may have with them one support person who is at least 50 years old. At this time, visitors are not allowed in the infusion area.   Etoposide, VP-16 injection What is this medication? ETOPOSIDE, VP-16 (e toe POE side) is a chemotherapy drug. It is used to treat testicular cancer, lung cancer, and other cancers. This medicine may be used for other purposes; ask your health care provider or pharmacist if you have questions. COMMON BRAND NAME(S): Etopophos, Toposar,  VePesid What should I tell my care team before I take this medication? They need to know if you have any of these conditions: infection kidney disease liver disease low blood counts, like low white cell, platelet, or red cell counts an unusual or allergic reaction to etoposide, other medicines, foods, dyes, or  preservatives pregnant or trying to get pregnant breast-feeding How should I use this medication? This medicine is for infusion into a vein. It is administered in a hospital or clinic by a specially trained health care professional. Talk to your pediatrician regarding the use of this medicine in children. Special care may be needed. Overdosage: If you think you have taken too much of this medicine contact a poison control center or emergency room at once. NOTE: This medicine is only for you. Do not share this medicine with others. What if I miss a dose? It is important not to miss your dose. Call your doctor or health care professional if you are unable to keep an appointment. What may interact with this medication? This medicine may interact with the following medications: warfarin This list may not describe all possible interactions. Give your health care provider a list of all the medicines, herbs, non-prescription drugs, or dietary supplements you use. Also tell them if you smoke, drink alcohol, or use illegal drugs. Some items may interact with your medicine. What should I watch for while using this medication? Visit your doctor for checks on your progress. This drug may make you feel generally unwell. This is not uncommon, as chemotherapy can affect healthy cells as well as cancer cells. Report any side effects. Continue your course of treatment even though you feel ill unless your doctor tells you to stop. In some cases, you may be given additional medicines to help with side effects. Follow all directions for their use. Call your doctor or health care professional for advice if you get a fever, chills or sore throat, or other symptoms of a cold or flu. Do not treat yourself. This drug decreases your body's ability to fight infections. Try to avoid being around people who are sick. This medicine may increase your risk to bruise or bleed. Call your doctor or health care professional if you  notice any unusual bleeding. Talk to your doctor about your risk of cancer. You may be more at risk for certain types of cancers if you take this medicine. Do not become pregnant while taking this medicine or for at least 6 months after stopping it. Women should inform their doctor if they wish to become pregnant or think they might be pregnant. Women of child-bearing potential will need to have a negative pregnancy test before starting this medicine. There is a potential for serious side effects to an unborn child. Talk to your health care professional or pharmacist for more information. Do not breast-feed an infant while taking this medicine. Men must use a latex condom during sexual contact with a woman while taking this medicine and for at least 4 months after stopping it. A latex condom is needed even if you have had a vasectomy. Contact your doctor right away if your partner becomes pregnant. Do not donate sperm while taking this medicine and for at least 4 months after you stop taking this medicine. Men should inform their doctors if they wish to father a child. This medicine may lower sperm counts. What side effects may I notice from receiving this medication? Side effects that you should report to your doctor or health  care professional as soon as possible: allergic reactions like skin rash, itching or hives, swelling of the face, lips, or tongue low blood counts - this medicine may decrease the number of white blood cells, red blood cells, and platelets. You may be at increased risk for infections and bleeding nausea, vomiting redness, blistering, peeling or loosening of the skin, including inside the mouth signs and symptoms of infection like fever; chills; cough; sore throat; pain or trouble passing urine signs and symptoms of low red blood cells or anemia such as unusually weak or tired; feeling faint or lightheaded; falls; breathing problems unusual bruising or bleeding Side effects that  usually do not require medical attention (report to your doctor or health care professional if they continue or are bothersome): changes in taste diarrhea hair loss loss of appetite mouth sores This list may not describe all possible side effects. Call your doctor for medical advice about side effects. You may report side effects to FDA at 1-800-FDA-1088. Where should I keep my medication? This drug is given in a hospital or clinic and will not be stored at home. NOTE: This sheet is a summary. It may not cover all possible information. If you have questions about this medicine, talk to your doctor, pharmacist, or health care provider.  2023 Elsevier/Gold Standard (2021-09-09 00:00:00)  Zoledronic Acid Injection (Cancer) What is this medication? ZOLEDRONIC ACID (ZOE le dron ik AS id) treats high calcium levels in the blood caused by cancer. It may also be used with chemotherapy to treat weakened bones caused by cancer. It works by slowing down the release of calcium from bones. This lowers calcium levels in your blood. It also makes your bones stronger and less likely to break (fracture). It belongs to a group of medications called bisphosphonates. This medicine may be used for other purposes; ask your health care provider or pharmacist if you have questions. COMMON BRAND NAME(S): Zometa, Zometa Powder What should I tell my care team before I take this medication? They need to know if you have any of these conditions: Dehydration Dental disease Kidney disease Liver disease Low levels of calcium in the blood Lung or breathing disease, such as asthma Receiving steroids, such as dexamethasone or prednisone An unusual or allergic reaction to zoledronic acid, other medications, foods, dyes, or preservatives Pregnant or trying to get pregnant Breast-feeding How should I use this medication? This medication is injected into a vein. It is given by your care team in a hospital or clinic  setting. Talk to your care team about the use of this medication in children. Special care may be needed. Overdosage: If you think you have taken too much of this medicine contact a poison control center or emergency room at once. NOTE: This medicine is only for you. Do not share this medicine with others. What if I miss a dose? Keep appointments for follow-up doses. It is important not to miss your dose. Call your care team if you are unable to keep an appointment. What may interact with this medication? Certain antibiotics given by injection Diuretics, such as bumetanide, furosemide NSAIDs, medications for pain and inflammation, such as ibuprofen or naproxen Teriparatide Thalidomide This list may not describe all possible interactions. Give your health care provider a list of all the medicines, herbs, non-prescription drugs, or dietary supplements you use. Also tell them if you smoke, drink alcohol, or use illegal drugs. Some items may interact with your medicine. What should I watch for while using this medication? Visit  your care team for regular checks on your progress. It may be some time before you see the benefit from this medication. Some people who take this medication have severe bone, joint, or muscle pain. This medication may also increase your risk for jaw problems or a broken thigh bone. Tell your care team right away if you have severe pain in your jaw, bones, joints, or muscles. Tell you care team if you have any pain that does not go away or that gets worse. Tell your dentist and dental surgeon that you are taking this medication. You should not have major dental surgery while on this medication. See your dentist to have a dental exam and fix any dental problems before starting this medication. Take good care of your teeth while on this medication. Make sure you see your dentist for regular follow-up appointments. You should make sure you get enough calcium and vitamin D while you  are taking this medication. Discuss the foods you eat and the vitamins you take with your care team. Check with your care team if you have severe diarrhea, nausea, and vomiting, or if you sweat a lot. The loss of too much body fluid may make it dangerous for you to take this medication. You may need bloodwork while taking this medication. Talk to your care team if you wish to become pregnant or think you might be pregnant. This medication can cause serious birth defects. What side effects may I notice from receiving this medication? Side effects that you should report to your care team as soon as possible: Allergic reactions--skin rash, itching, hives, swelling of the face, lips, tongue, or throat Kidney injury--decrease in the amount of urine, swelling of the ankles, hands, or feet Low calcium level--muscle pain or cramps, confusion, tingling, or numbness in the hands or feet Osteonecrosis of the jaw--pain, swelling, or redness in the mouth, numbness of the jaw, poor healing after dental work, unusual discharge from the mouth, visible bones in the mouth Severe bone, joint, or muscle pain Side effects that usually do not require medical attention (report to your care team if they continue or are bothersome): Constipation Fatigue Fever Loss of appetite Nausea Stomach pain This list may not describe all possible side effects. Call your doctor for medical advice about side effects. You may report side effects to FDA at 1-800-FDA-1088. Where should I keep my medication? This medication is given in a hospital or clinic. It will not be stored at home. NOTE: This sheet is a summary. It may not cover all possible information. If you have questions about this medicine, talk to your doctor, pharmacist, or health care provider.  2023 Elsevier/Gold Standard (2021-12-02 00:00:00)

## 2022-04-05 NOTE — Progress Notes (Signed)
Nutrition  RD planning to see patient during infusion today but infusion completed prior to RD being able to see patient.    Checked waiting room times 2 for patient but not in waiting room.    Called patient via phone to follow-up.  Left  message with call back number.    Cynthia Stainback B. Zenia Resides, [place], [place] Registered Dietitian [phone]

## 2022-04-06 ENCOUNTER — Inpatient Hospital Stay: Payer: BC Managed Care – PPO

## 2022-04-06 DIAGNOSIS — Z5112 Encounter for antineoplastic immunotherapy: Secondary | ICD-10-CM | POA: Diagnosis not present

## 2022-04-06 DIAGNOSIS — C3412 Malignant neoplasm of upper lobe, left bronchus or lung: Secondary | ICD-10-CM

## 2022-04-06 MED ORDER — PEGFILGRASTIM-CBQV 6 MG/0.6ML ~~LOC~~ SOSY
6.0000 mg | PREFILLED_SYRINGE | Freq: Once | SUBCUTANEOUS | Status: AC
  Administered 2022-04-06: 6 mg via SUBCUTANEOUS
  Filled 2022-04-06: qty 0.6

## 2022-04-10 ENCOUNTER — Ambulatory Visit: Payer: Self-pay | Admitting: Licensed Clinical Social Worker

## 2022-04-10 ENCOUNTER — Encounter: Payer: Self-pay | Admitting: Licensed Clinical Social Worker

## 2022-04-10 ENCOUNTER — Telehealth: Payer: Self-pay | Admitting: Licensed Clinical Social Worker

## 2022-04-10 DIAGNOSIS — Z1379 Encounter for other screening for genetic and chromosomal anomalies: Secondary | ICD-10-CM

## 2022-04-10 NOTE — Telephone Encounter (Signed)
Revealed negative genetic testing. This normal result is reassuring and indicates that it is unlikely [place] cancer is due to a hereditary cause.  It is unlikely that there is an increased risk of another cancer due to a mutation in one of these genes.  However, genetic testing is not perfect, and cannot definitively rule out a hereditary cause.  It will be important for him to keep in contact with genetics to learn if any additional testing may be needed in the future.

## 2022-04-10 NOTE — Progress Notes (Signed)
HPI:  Jose Morse was previously seen in the [place] clinic due to a family history of cancer and concerns regarding a hereditary predisposition to cancer. Please refer to our prior cancer genetics clinic note for more information regarding our discussion, assessment and recommendations, at the time. Jose Morse recent genetic test results were disclosed to him, as were recommendations warranted by these results. These results and recommendations are discussed in more detail below.  CANCER HISTORY:  Oncology History Overview Note  # . LUNG, LEFT UPPER LOBE; ENB-GUIDED TRANSBRONCHIAL FORCEPS BIOPSY: - SMALL CELL CARCINOMA.  [Dr.Aleskerov]  Comment:  Invasive carcinoma is present in two fragments, with crush artifact. The  cells are small, with scant cytoplasm, nuclear molding, and  inconspicuous nucleoli. There is necrosis.   Immunohistochemistry (IHC) was performed for further characterization.  The neoplastic cells are positive for CD56 with strong staining of over  90% of cells. They are negative for TTF-1 and p40.    IMPRESSION: 1. LEFT suprahilar mass constricting LEFT upper lobe bronchus consistent with primary bronchogenic carcinoma. 2. Ipsilateral mediastinal nodal metastasis. 3. Distant visceral metastasis to the LIVER and bilateral ADRENAL GLANDS. 4. Distant nodal metastasis to the LEFT external iliac node. 5. Multifocal hypermetabolic skeletal metastasis (approximately 40 lesions). Lesions are occult by CT imaging. 6. Focal activity beneath the skin posterior to the RIGHT ear. Differential includes nodal metastasis versus primary parotid neoplasm. Favor nodal metastasis. (Recommend attention on brain MRI workup).     Electronically Signed   By: Suzy Bouchard M.D.   On: 02/06/2022 13:00  # April 2023-brain MRI incidental/screening 4 mm x 2 cerebellar lesions.  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  Significant diagnoses are listed below: Family History  Problem Relation Age of Onset   Ovarian cancer Mother    Colon cancer Father    Diabetes Father     Jose Morse has 1 son, 23, and 1 daughter, 44. He has 1 sister and 1 brother.   Jose Morse mother had ovarian cancer in her 58s and is living at 66. Patient has 9 maternal aunts/uncles. One uncle and one aunt had cancer, unknown exact types. Maternal grandmother passed in her 17s, grandfather passed at 39.   Jose Morse father had colon cancer in his 28s and passed at 12. No other known cancers on this side of the family.   Jose Morse is unaware of previous family history of genetic testing for hereditary cancer risks. There is no reported Ashkenazi Jewish ancestry. There is no known consanguinity.     GENETIC TEST RESULTS: Genetic testing reported out on 04/07/2022 through the [facility] CancerNext-Expanded+RNA  cancer panel found no pathogenic mutations.   The CancerNext-Expanded + RNAinsight gene panel offered by [employer] and includes sequencing and rearrangement analysis for the following 77 genes: IP, ALK, APC*, ATM*, AXIN2, BAP1, BARD1, BLM, BMPR1A, BRCA1*, BRCA2*, BRIP1*, CDC73, CDH1*,CDK4, CDKN1B,  CDKN2A, CHEK2*, CTNNA1, DICER1, FANCC, FH, FLCN, GALNT12, KIF1B, LZTR1, MAX, MEN1, MET, MLH1*, MSH2*, MSH3, MSH6*, MUTYH*, NBN, NF1*, NF2, NTHL1, PALB2*, PHOX2B, PMS2*, POT1, PRKAR1A, PTCH1, PTEN*, RAD51C*, RAD51D*,RB1,  RECQL, RET, SDHA, SDHAF2, SDHB, SDHC, SDHD, SMAD4, SMARCA4, SMARCB1, SMARCE1, STK11, SUFU, TMEM127, TP53*,TSC1, TSC2, VHL and XRCC2 (sequencing and deletion/duplication); EGFR, EGLN1, HOXB13, KIT, MITF, PDGFRA, POLD1 and POLE (sequencing only); EPCAM and GREM1 (deletion/duplication only).   The test report has been scanned into EPIC and is located under the Molecular Pathology section of the Results Review tab.  A portion of the result report is included below for reference.     We discussed that because current genetic testing is not perfect, it is possible there may be a gene mutation in one of these genes that current testing cannot detect, but that chance is small.  There could be another gene that has not yet been discovered, or that we have not yet tested, that is responsible for the cancer diagnoses in the family. It is also possible there is a hereditary cause for the cancer in the family that Jose Morse did not inherit and therefore was not identified in his testing.  Therefore, it is important to remain in touch with cancer genetics in the future so that we can continue to offer Jose Morse the most up to date genetic testing.   ADDITIONAL GENETIC TESTING:We discussed with Jose Morse that his genetic testing was fairly extensive.  If there are genes identified to increase cancer risk that can be analyzed in the future, we would be happy to discuss and coordinate this testing at that time.    CANCER SCREENING RECOMMENDATIONS: Jose Morse test result is considered negative (normal).  This means that we have not identified a hereditary cause for his  personal and family history of cancer at this time. Most cancers happen by chance and this negative test suggests that his cancer may fall into this category.    While reassuring, this does not definitively rule out a hereditary predisposition to cancer. It is still possible that there could be genetic mutations that are undetectable by current  technology. There could be genetic mutations in genes that have not been tested or identified to increase cancer risk.  Therefore, it is recommended he continue to follow the cancer management and screening guidelines provided by his oncology and primary healthcare provider.   An individual's cancer risk and medical management are not determined by genetic test results alone. Overall cancer risk assessment incorporates additional factors, including personal medical history, family history, and any available genetic information that may result in a personalized plan for cancer prevention and surveillance.  RECOMMENDATIONS FOR FAMILY MEMBERS:  Relatives in this family might be at some increased risk of developing cancer, over the general population risk, simply due to the family history of cancer.  We recommended male relatives in this family have a yearly mammogram beginning at age 67, or 40 years younger than the earliest onset of cancer, an annual clinical breast exam, and perform monthly breast self-exams. Male relatives in this family should also have a gynecological exam as recommended by their primary provider.  All family members should be referred for colonoscopy starting at age 11.    It is also possible there is a hereditary cause for the cancer in Jose Morse family that he did not inherit and therefore was not identified in him.  Based on Jose Morse family history, we recommended his mother/maternal relatives have genetic counseling and testing.  Jose Morse will let us know if we can be of any assistance in coordinating genetic counseling and/or testing for these family members.  FOLLOW-UP: Lastly, we discussed with Jose Morse that cancer genetics is a rapidly advancing field and it is possible that new genetic tests will be appropriate for him and/or his family members in the future. We encouraged him to remain in contact with cancer genetics on an annual basis so we can update his  personal and family histories and let him know of advances in cancer genetics that may benefit this family.   Our contact number was provided. Jose Morse questions were answered to his satisfaction, and he knows he is welcome to call us at anytime with additional questions or concerns.   Faith Rogue, MS, [facility] Genetic Counselor [email or web address] Phone: [phone]

## 2022-04-17 ENCOUNTER — Ambulatory Visit
Admission: RE | Admit: 2022-04-17 | Discharge: 2022-04-17 | Disposition: A | Discharge status: Home / Self Care | Payer: BC Managed Care – PPO | Source: Ambulatory Visit | Attending: Internal Medicine | Admitting: Internal Medicine

## 2022-04-17 DIAGNOSIS — C3490 Malignant neoplasm of unspecified part of unspecified bronchus or lung: Secondary | ICD-10-CM | POA: Diagnosis present

## 2022-04-17 DIAGNOSIS — C7931 Secondary malignant neoplasm of brain: Secondary | ICD-10-CM | POA: Insufficient documentation

## 2022-04-17 DIAGNOSIS — C3412 Malignant neoplasm of upper lobe, left bronchus or lung: Secondary | ICD-10-CM

## 2022-04-17 MED ORDER — GADOBUTROL 1 MMOL/ML IV SOLN
7.0000 mL | Freq: Once | INTRAVENOUS | Status: AC | PRN
  Administered 2022-04-17: 7 mL via INTRAVENOUS

## 2022-04-17 MED ORDER — IOHEXOL 350 MG/ML SOLN
100.0000 mL | Freq: Once | INTRAVENOUS | Status: AC | PRN
  Administered 2022-04-17: 100 mL via INTRAVENOUS

## 2022-04-24 ENCOUNTER — Inpatient Hospital Stay (HOSPITAL_BASED_OUTPATIENT_CLINIC_OR_DEPARTMENT_OTHER): Payer: BC Managed Care – PPO | Admitting: Internal Medicine

## 2022-04-24 ENCOUNTER — Inpatient Hospital Stay: Payer: BC Managed Care – PPO

## 2022-04-24 ENCOUNTER — Inpatient Hospital Stay: Payer: BC Managed Care – PPO | Attending: Internal Medicine

## 2022-04-24 ENCOUNTER — Encounter: Payer: Self-pay | Admitting: Internal Medicine

## 2022-04-24 DIAGNOSIS — Z8041 Family history of malignant neoplasm of ovary: Secondary | ICD-10-CM | POA: Insufficient documentation

## 2022-04-24 DIAGNOSIS — C7931 Secondary malignant neoplasm of brain: Secondary | ICD-10-CM | POA: Insufficient documentation

## 2022-04-24 DIAGNOSIS — R7401 Elevation of levels of liver transaminase levels: Secondary | ICD-10-CM | POA: Diagnosis not present

## 2022-04-24 DIAGNOSIS — Z5112 Encounter for antineoplastic immunotherapy: Secondary | ICD-10-CM | POA: Insufficient documentation

## 2022-04-24 DIAGNOSIS — Z800 Family history of malignant neoplasm of digestive organs: Secondary | ICD-10-CM | POA: Diagnosis not present

## 2022-04-24 DIAGNOSIS — C7951 Secondary malignant neoplasm of bone: Secondary | ICD-10-CM | POA: Diagnosis not present

## 2022-04-24 DIAGNOSIS — J449 Chronic obstructive pulmonary disease, unspecified: Secondary | ICD-10-CM | POA: Diagnosis not present

## 2022-04-24 DIAGNOSIS — E876 Hypokalemia: Secondary | ICD-10-CM | POA: Diagnosis not present

## 2022-04-24 DIAGNOSIS — Z5111 Encounter for antineoplastic chemotherapy: Secondary | ICD-10-CM | POA: Insufficient documentation

## 2022-04-24 DIAGNOSIS — C3412 Malignant neoplasm of upper lobe, left bronchus or lung: Secondary | ICD-10-CM | POA: Diagnosis not present

## 2022-04-24 DIAGNOSIS — Z5189 Encounter for other specified aftercare: Secondary | ICD-10-CM | POA: Insufficient documentation

## 2022-04-24 DIAGNOSIS — R5383 Other fatigue: Secondary | ICD-10-CM | POA: Diagnosis not present

## 2022-04-24 DIAGNOSIS — F17210 Nicotine dependence, cigarettes, uncomplicated: Secondary | ICD-10-CM | POA: Diagnosis not present

## 2022-04-24 DIAGNOSIS — R062 Wheezing: Secondary | ICD-10-CM | POA: Insufficient documentation

## 2022-04-24 LAB — COMPREHENSIVE METABOLIC PANEL
ALT: 44 U/L (ref 0–44)
AST: 35 U/L (ref 15–41)
Albumin: 3.9 g/dL (ref 3.5–5.0)
Alkaline Phosphatase: 82 U/L (ref 38–126)
Anion gap: 10 (ref 5–15)
BUN: 15 mg/dL (ref 6–20)
CO2: 23 mmol/L (ref 22–32)
Calcium: 9.1 mg/dL (ref 8.9–10.3)
Chloride: 105 mmol/L (ref 98–111)
Creatinine, Ser: 0.83 mg/dL (ref 0.61–1.24)
GFR, Estimated: >60 mL/min (ref >60)
Glucose, Bld: 117 mg/dL — ABNORMAL HIGH (ref 70–99)
Potassium: 3.7 mmol/L (ref 3.5–5.1)
Sodium: 138 mmol/L (ref 135–145)
Total Bilirubin: 0.8 mg/dL (ref 0.3–1.2)
Total Protein: 7.4 g/dL (ref 6.5–8.1)

## 2022-04-24 LAB — CBC WITH DIFFERENTIAL/PLATELET
Abs Immature Granulocytes: 0.26 10*3/uL — ABNORMAL HIGH (ref 0.00–0.07)
Basophils Absolute: 0.1 10*3/uL (ref 0.0–0.1)
Basophils Relative: 1 %
Eosinophils Absolute: 0.1 10*3/uL (ref 0.0–0.5)
Eosinophils Relative: 1 %
HCT: 29.4 % — ABNORMAL LOW (ref 39.0–52.0)
Hemoglobin: 9.5 g/dL — ABNORMAL LOW (ref 13.0–17.0)
Immature Granulocytes: 3 %
Lymphocytes Relative: 13 %
Lymphs Abs: 1.2 10*3/uL (ref 0.7–4.0)
MCH: 32 pg (ref 26.0–34.0)
MCHC: 32.3 g/dL (ref 30.0–36.0)
MCV: 99 fL (ref 80.0–100.0)
Monocytes Absolute: 1.3 10*3/uL — ABNORMAL HIGH (ref 0.1–1.0)
Monocytes Relative: 13 %
Neutro Abs: 6.8 10*3/uL (ref 1.7–7.7)
Neutrophils Relative %: 69 %
Platelets: 185 10*3/uL (ref 150–400)
RBC: 2.97 MIL/uL — ABNORMAL LOW (ref 4.22–5.81)
RDW: 21.7 % — ABNORMAL HIGH (ref 11.5–15.5)
WBC: 9.6 10*3/uL (ref 4.0–10.5)
nRBC: 0.5 % — ABNORMAL HIGH (ref 0.0–0.2)

## 2022-04-24 MED ORDER — SODIUM CHLORIDE 0.9 % IV SOLN
1200.0000 mg | Freq: Once | INTRAVENOUS | Status: AC
  Administered 2022-04-24: 1200 mg via INTRAVENOUS
  Filled 2022-04-24: qty 20

## 2022-04-24 MED ORDER — OXYCODONE-ACETAMINOPHEN 5-325 MG PO TABS: 1.0000 | ORAL_TABLET | Freq: Three times a day (TID) | ORAL | 0 refills | Status: DC | PRN

## 2022-04-24 MED ORDER — SODIUM CHLORIDE 0.9 % IV SOLN
632.0000 mg | Freq: Once | INTRAVENOUS | Status: AC
  Administered 2022-04-24: 630 mg via INTRAVENOUS
  Filled 2022-04-24: qty 63

## 2022-04-24 MED ORDER — HEPARIN SOD (PORK) LOCK FLUSH 100 UNIT/ML IV SOLN
500.0000 [IU] | Freq: Once | INTRAVENOUS | Status: AC | PRN
  Administered 2022-04-24: 500 [IU]
  Filled 2022-04-24: qty 5

## 2022-04-24 MED ORDER — SODIUM CHLORIDE 0.9 % IV SOLN
Freq: Once | INTRAVENOUS | Status: AC
  Filled 2022-04-24: qty 250

## 2022-04-24 MED ORDER — SODIUM CHLORIDE 0.9 % IV SOLN
100.0000 mg/m2 | Freq: Once | INTRAVENOUS | Status: AC
  Administered 2022-04-24: 180 mg via INTRAVENOUS
  Filled 2022-04-24: qty 9

## 2022-04-24 MED ORDER — ZOLEDRONIC ACID 4 MG/100ML IV SOLN: 4.0000 mg | Freq: Once | INTRAVENOUS | Status: DC

## 2022-04-24 MED ORDER — SODIUM CHLORIDE 0.9 % IV SOLN
10.0000 mg | Freq: Once | INTRAVENOUS | Status: AC
  Administered 2022-04-24: 10 mg via INTRAVENOUS
  Filled 2022-04-24: qty 10

## 2022-04-24 MED ORDER — SODIUM CHLORIDE 0.9 % IV SOLN
150.0000 mg | Freq: Once | INTRAVENOUS | Status: AC
  Administered 2022-04-24: 150 mg via INTRAVENOUS
  Filled 2022-04-24: qty 150

## 2022-04-24 MED ORDER — PALONOSETRON HCL INJECTION 0.25 MG/5ML
0.2500 mg | Freq: Once | INTRAVENOUS | Status: AC
  Administered 2022-04-24: 0.25 mg via INTRAVENOUS
  Filled 2022-04-24: qty 5

## 2022-04-24 NOTE — Progress Notes (Signed)
[facility] CONSULT NOTE  Patient Care Team: Tracie Harrier, MD as PCP - General (Internal Medicine) Telford Nab, RN as Oncology Nurse Navigator Cammie Sickle, MD as Consulting Physician (Oncology)  CHIEF COMPLAINTS/PURPOSE OF CONSULTATION: lung cancer  #  Oncology History Overview Note  # . LUNG, LEFT UPPER LOBE; ENB-GUIDED TRANSBRONCHIAL FORCEPS BIOPSY: - SMALL CELL CARCINOMA.  [Dr.Aleskerov]  Comment:  Invasive carcinoma is present in two fragments, with crush artifact. The  cells are small, with scant cytoplasm, nuclear molding, and  inconspicuous nucleoli. There is necrosis.   Immunohistochemistry (IHC) was performed for further characterization.  The neoplastic cells are positive for CD56 with strong staining of over  90% of cells. They are negative for TTF-1 and p40.    IMPRESSION: 1. LEFT suprahilar mass constricting LEFT upper lobe bronchus consistent with primary bronchogenic carcinoma. 2. Ipsilateral mediastinal nodal metastasis. 3. Distant visceral metastasis to the LIVER and bilateral ADRENAL GLANDS. 4. Distant nodal metastasis to the LEFT external iliac node. 5. Multifocal hypermetabolic skeletal metastasis (approximately 40 lesions). Lesions are occult by CT imaging. 6. Focal activity beneath the skin posterior to the RIGHT ear. Differential includes nodal metastasis versus primary parotid neoplasm. Favor nodal metastasis. (Recommend attention on brain MRI workup).     Electronically Signed   By: Suzy Bouchard M.D.   On: 02/06/2022 13:00  # April 2023-brain MRI incidental/screening 4 mm x 2 cerebellar lesions.  # MAY 1st, 2023- carboo-Etop [d-1-3]; udenyca   Primary cancer of left upper lobe of lung ([place])  02/16/2022 Initial Diagnosis   Primary cancer of left upper lobe of lung ([place])   02/16/2022 Cancer Staging   Staging form: Lung, AJCC 8th Edition - Clinical: Stage IVB (cT3, cN2, pM1c) - Signed by Cammie Sickle,  MD on 02/16/2022 Stage prefix: Initial diagnosis   02/20/2022 -  Chemotherapy   Patient is on Treatment Plan : LUNG SCLC Carboplatin + Etoposide + Atezolizumab Induction q21d / Atezolizumab Maintenance q21d        HISTORY OF PRESENTING ILLNESS: Ambulating independently.  Alone.   Jose Morse 51 y.o.  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  Denies any worsening bone pain.  Patient taking oxycodone as needed.  Denies any worsening shortness of breath or cough.  Denies hemoptysis.  Denies any worsening headaches.  Appetite is good.  No weight loss.   Review of Systems  Constitutional:  Positive for malaise/fatigue. Negative for chills, diaphoresis, fever and weight loss.  HENT:  Negative for nosebleeds and sore throat.   Eyes:  Negative for double vision.  Respiratory:  Positive for wheezing. Negative for cough, hemoptysis, sputum production and shortness of breath.   Cardiovascular:  Positive for chest pain. Negative for palpitations, orthopnea and leg swelling.  Gastrointestinal:  Negative for abdominal pain, blood in stool, constipation, diarrhea, heartburn, melena, nausea and vomiting.  Genitourinary:  Negative for dysuria, frequency and urgency.  Musculoskeletal:  Positive for back pain and joint pain.  Skin: Negative.  Negative for itching and rash.  Neurological:  Negative for dizziness, tingling, focal weakness, weakness and headaches.  Endo/Heme/Allergies:  Does not bruise/bleed easily.  Psychiatric/Behavioral:  Negative for depression. The patient is not nervous/anxious and does not have insomnia.      MEDICAL HISTORY:  Past Medical History:  Diagnosis Date   History of kidney stones    Hypercholesteremia     SURGICAL HISTORY: Past  Surgical History:  Procedure Laterality Date   CHOLECYSTECTOMY     IR IMAGING GUIDED PORT INSERTION  02/23/2022    VIDEO BRONCHOSCOPY WITH ENDOBRONCHIAL NAVIGATION N/A 02/10/2022   Procedure: VIDEO BRONCHOSCOPY WITH ENDOBRONCHIAL NAVIGATION;  Surgeon: Ottie Glazier, MD;  Location: [facility] ORS;  Service: Thoracic;  Laterality: N/A;   VIDEO BRONCHOSCOPY WITH ENDOBRONCHIAL ULTRASOUND N/A 02/10/2022   Procedure: VIDEO BRONCHOSCOPY WITH ENDOBRONCHIAL ULTRASOUND;  Surgeon: Ottie Glazier, MD;  Location: [facility] ORS;  Service: Thoracic;  Laterality: N/A;    SOCIAL HISTORY: Social History   Socioeconomic History   Marital status: Married    Spouse name: Not on file   Number of children: Not on file   Years of education: Not on file   Highest education level: Not on file  Occupational History   Not on file  Tobacco Use   Smoking status: Every Day    Packs/day: 0.50    Years: 34.00    Total pack years: 17.00    Types: Cigarettes   Smokeless tobacco: Never  Vaping Use   Vaping Use: Never used  Substance and Sexual Activity   Alcohol use: No   Drug use: Not Currently    Types: Marijuana   Sexual activity: Yes  Other Topics Concern   Not on file  Social History Narrative   Lives in [place]; with wife/son- 41 y [2023]; works in [employer]. Smoker; no alcohol.    Social Determinants of Health   Financial Resource Strain: Not on file  Food Insecurity: Not on file  Transportation Needs: Not on file  Physical Activity: Not on file  Stress: Not on file  Social Connections: Not on file  Intimate Partner Violence: Not on file    FAMILY HISTORY: Family History  Problem Relation Age of Onset   Ovarian cancer Mother    Colon cancer Father    Diabetes Father     ALLERGIES:  is allergic to codeine.  MEDICATIONS:  Current Outpatient Medications  Medication Sig Dispense Refill   albuterol (PROVENTIL HFA) 108 (90 Base) MCG/ACT inhaler Inhale 2 puffs into the lungs every 4 (four) hours as needed for wheezing or shortness of breath. 1 each 0   Budeson-Glycopyrrol-Formoterol (BREZTRI AEROSPHERE)  160-9-4.8 MCG/ACT AERO Inhale 2 puffs into the lungs in the morning and at bedtime.     lidocaine-prilocaine (EMLA) cream Apply on the port. 30 -45 min  prior to port access. 30 g 3   ondansetron (ZOFRAN) 8 MG tablet One pill every 8 hours as needed for nausea/vomitting. 40 tablet 1   oxyCODONE-acetaminophen (PERCOCET/ROXICET) 5-325 MG tablet Take 1 tablet by mouth every 8 (eight) hours as needed for severe pain. 60 tablet 0   potassium chloride SA (KLOR-CON M) 20 MEQ tablet 1 pill twice a day 60 tablet 3   prochlorperazine (COMPAZINE) 10 MG tablet Take 1 tablet (10 mg total) by mouth every 6 (six) hours as needed for nausea or vomiting. 40 tablet 1   rosuvastatin (CRESTOR) 5 MG tablet Take 5 mg by mouth at bedtime.     traMADol (ULTRAM) 50 MG tablet Take 1 tablet (50 mg total) by mouth every 6 (six) hours as needed. 60 tablet 0   No current facility-administered medications for this visit.   Facility-Administered Medications Ordered in Other Visits  Medication Dose Route Frequency Provider Last Rate Last Admin   atezolizumab (TECENTRIQ) 1,200 mg in sodium chloride 0.9 % 250 mL chemo infusion  1,200 mg Intravenous Once Cammie Sickle, MD  CARBOplatin (PARAPLATIN) 630 mg in sodium chloride 0.9 % 250 mL chemo infusion  630 mg Intravenous Once Charlaine Dalton R, MD       etoposide (VEPESID) 180 mg in sodium chloride 0.9 % 500 mL chemo infusion  100 mg/m2 (Treatment Plan Recorded) Intravenous Once Charlaine Dalton R, MD       heparin lock flush 100 unit/mL  500 Units Intracatheter Once PRN Cammie Sickle, MD          Left chest wall skin rash anterior-maculopapular skin rash.;  Not in dermatomal distribution.  PHYSICAL EXAMINATION: ECOG PERFORMANCE STATUS: 1 - Symptomatic but completely ambulatory  Vitals:   04/24/22 0852  BP: (!) 145/92  Pulse: 82  Temp: 97.6 F (36.4 C)  SpO2: 100%   Filed Weights   04/24/22 0852  Weight: 151 lb 12.8 oz (68.9 kg)     Physical Exam Vitals and nursing note reviewed.  HENT:     Head: Normocephalic and atraumatic.     Mouth/Throat:     Pharynx: Oropharynx is clear.  Eyes:     Extraocular Movements: Extraocular movements intact.     Pupils: Pupils are equal, round, and reactive to light.  Cardiovascular:     Rate and Rhythm: Normal rate and regular rhythm.  Pulmonary:     Comments: Decreased breath sounds bilaterally.  Abdominal:     Palpations: Abdomen is soft.  Musculoskeletal:        General: Normal range of motion.     Cervical back: Normal range of motion.  Skin:    General: Skin is warm.  Neurological:     General: No focal deficit present.     Mental Status: He is alert and oriented to person, place, and time.  Psychiatric:        Behavior: Behavior normal.        Judgment: Judgment normal.      LABORATORY DATA:  I have reviewed the data as listed Lab Results  Component Value Date   WBC 9.6 04/24/2022   HGB 9.5 (L) 04/24/2022   HCT 29.4 (L) 04/24/2022   MCV 99.0 04/24/2022   PLT 185 04/24/2022   Recent Labs    03/21/22 1254 04/03/22 0758 04/24/22 0904  NA 134* 137 138  K 3.8 4.0 3.7  CL 103 107 105  CO2 25 24 23   GLUCOSE 118* 121* 117*  BUN 13 11 15   CREATININE 0.69 0.78 0.83  CALCIUM 8.9 8.8* 9.1  GFRNONAA >60 >60 >60  PROT 6.8 7.4 7.4  ALBUMIN 3.7 3.7 3.9  AST 21 33 35  ALT 37 54* 44  ALKPHOS 169* 122 82  BILITOT 0.5 0.8 0.8    RADIOGRAPHIC STUDIES: I have personally reviewed the radiological images as listed and agreed with the findings in the report. MR BRAIN W WO CONTRAST  Result Date: 04/17/2022 CLINICAL DATA:  Metastatic lung cancer EXAM: MRI HEAD WITHOUT AND WITH CONTRAST TECHNIQUE: Multiplanar, multiecho pulse sequences of the brain and surrounding structures were obtained without and with intravenous contrast. CONTRAST:  61mL GADAVIST GADOBUTROL 1 MMOL/ML IV SOLN COMPARISON:  02/16/2022 FINDINGS: Brain: Foci of cerebellar enhancement have  resolved. No new mass or abnormal enhancement. There is no acute infarction or intracranial hemorrhage. No mass effect or edema. There is no hydrocephalus or extra-axial fluid collection. Ventricles and sulci are normal in size and configuration. Vascular: Major vessel flow voids at the skull base are preserved. Skull and upper cervical spine: Stable abnormal marrow signal within the high posterior  right parietal calvarium. Previously seen C4 metastasis is not well evaluated. Sinuses/Orbits: Paranasal sinus mucosal thickening. Orbits are unremarkable. Other: Right periauricular lymph node posterior to the parotid is no longer identified. Sella is unremarkable. Mastoid air cells are clear. IMPRESSION: Resolution of cerebellar lesions. No new intracranial mass or abnormal enhancement. 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 Resolution of previously seen right periauricular lymph node. Electronically Signed   By: Macy Mis M.D.   On: 04/17/2022 14:09   CT CHEST ABDOMEN PELVIS W CONTRAST  Result Date: 04/17/2022 CLINICAL DATA:  Small cell lung cancer, assess treatment response. * Tracking Code: BO * EXAM: CT CHEST, ABDOMEN, AND PELVIS WITH CONTRAST TECHNIQUE: Multidetector CT imaging of the chest, abdomen and pelvis was performed following the standard protocol during bolus administration of intravenous contrast. RADIATION DOSE REDUCTION: This exam was performed according to the departmental dose-optimization program which includes automated exposure control, adjustment of the mA and/or kV according to patient size and/or use of iterative reconstruction technique. CONTRAST:  186mL OMNIPAQUE IOHEXOL 350 MG/ML SOLN COMPARISON:  CT chest 02/09/2022, 01/03/2022 and PET 02/06/2022. FINDINGS: CT CHEST FINDINGS Cardiovascular: Right IJ Port-A-Cath terminates in the high right atrium. Heart size normal. No pericardial effusion. Mediastinum/Nodes: No pathologically enlarged mediastinal, hilar or axillary  lymph nodes. Minimal residual soft tissue thickening in the left hilum. Esophagus is grossly unremarkable. Lungs/Pleura: Mild centrilobular emphysema. 4 mm posterior right lower lobe nodule (4/100), stable. Previously seen perihilar left upper lobe mass has nearly completely resolved with minimal residual soft tissue thickening in the left hilum as well as a tiny area of nodular consolidation in the posterolateral left upper lobe, measuring 5 mm (4/54). Perifissural lymph node along the left major fissure. No pleural fluid. Debris in the airway. Musculoskeletal: No worrisome lytic or sclerotic lesions. CT ABDOMEN PELVIS FINDINGS Hepatobiliary: Hypodense metastasis in the peripheral left hepatic lobe has decreased in size, now measuring 1.1 x 1.3 cm (2/61), previously approximately 2.4 x 2.7 cm. Blush of hyperattenuation in the peripheral right hepatic lobe measures 2.0 x 2.2 cm and corresponds to a low-attenuation lesion on 01/03/2022. No associated hypermetabolism on PET 02/06/2022. Findings favor a hemangioma. Cholecystectomy. No biliary ductal dilatation. Pancreas: Negative. Spleen: Negative. Adrenals/Urinary Tract: Adrenal glands are unremarkable. Scarring in the lower pole right kidney. Kidneys are otherwise unremarkable. Ureters are decompressed. Bladder is thick-walled and rather low in volume. Stomach/Bowel: Stomach, small bowel, appendix and colon are unremarkable. Vascular/Lymphatic: Vascular structures are unremarkable. No pathologically enlarged lymph nodes. Iliac chain lymph nodes measure up to 7 mm on the right (2/110). Reproductive: Prostate is visualized. Other: No free fluid. Left inguinal hernia contains fat. Mesenteries and peritoneum are unremarkable. There is a soft tissue tract from the anus to the medial right buttock skin surface (2/126). No definite organized fluid collection. Musculoskeletal: No worrisome lytic or sclerotic lesions. IMPRESSION: 1. Profound response to therapy with minimal  residual soft tissue thickening in the left hilum and a tiny area of nodular consolidation in the posterolateral left upper lobe. Small residual left hepatic lobe metastasis. No additional evidence of metastatic disease. 2. 4 mm posterior right lower lobe nodule, stable. Recommend continued attention on follow-up. 3. Thick-walled bladder. 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 5.  Emphysema (ICD10-J43.9). Electronically Signed   By: Lorin Picket M.D.   On: 04/17/2022 13:54    ASSESSMENT & PLAN:   Primary cancer of left upper lobe of lung ([facility]) #Extensive stage small cell lung cancer /stage IV. April 2023-brain MRI-4 mm x  2 metastases noted.  Currently on [place] etoposide-Tecentriq every 3 weeks x 4 cycles; followed by maintenance Tecentriq post chemotherapy. JUNE 26th, 2023- IMPRESSION: Significant response to therapy [both in lung and liver; no new disease]; 4 mm right lower lobe nodule-monitor.  See incidental findings.  #Proceed with cycle #4-  of carboplatin etoposide-Tecentriq; labs today reviewed;  acceptable for treatment today-except for elevated LFTs see below. JUNE 2023- TSH- WNL.  Again reviewed that patient will proceed with maintenance immunotherapy starting next cycle.   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  #Hypokalemia potassium 3.7 secondary chemotherapy--STABLE-continue K-Dur one a day.  # #Brain metastases : April 2023-MRI brain 4 mm x 2 [asymptomatic/screening MRI]-NO WBRT.  Status post chemo-chemotherapy.  JUNE 2023-resolution of brain metastases. STABLE.   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 s/p Zometa-4 mg IV every  6 weeks.   #Left chest wall skin rash anterior-appears allergic; no concerns for shingles.  Recommend hydrocortisone ointment.  # #Incidental  findings on Imaging JUNE , 2023 CT scan: Emphysema; gallbladder I reviewed/discussed/counseled the patient.   # Smoking: Active smoker;  the process of quitting. STABLE.   # COPD: Continue inhalers [Dr.Aleskerov]- STABLE.   # DISPOSITION: # chemo today and chemo this week as planned;  D-4 Udenyca  #  NO Zometa today   # follow up in 3 weeks with NP;labs- cbc/cmp- Tecentriq;ONLY  # follow up in 6 weeks with MD; Gildardo Pounds; Zometa-  Dr.B.   # I reviewed the blood work- with the patient in detail; also reviewed the imaging independently [as summarized above]; and with the patient in detail.       All questions were answered. The patient knows to call the clinic with any problems, questions or concerns.       Cammie Sickle, MD 04/24/2022 11:02 AM

## 2022-04-24 NOTE — Patient Instructions (Signed)
[facility] [place]  Discharge Instructions: Thank you for choosing [facility] to provide your oncology and hematology care.  If you have a lab appointment with the [facility], please go directly to the [facility] and check in at the registration area.  Wear comfortable clothing and clothing appropriate for easy access to any Portacath or PICC line.   We strive to give you quality time with your provider. You may need to reschedule your appointment if you arrive late (15 or more minutes).  Arriving late affects you and other patients whose appointments are after yours.  Also, if you miss three or more appointments without notifying the office, you may be dismissed from the clinic at the provider's discretion.      For prescription refill requests, have your pharmacy contact our office and allow 72 hours for refills to be completed.    Today you received the following chemotherapy and/or immunotherapy agents Carboplatin, Etoposide, Tecentriq      To help prevent nausea and vomiting after your treatment, we encourage you to take your nausea medication as directed.  BELOW ARE SYMPTOMS THAT SHOULD BE REPORTED IMMEDIATELY: *FEVER GREATER THAN 100.4 F (38 C) OR HIGHER *CHILLS OR SWEATING *NAUSEA AND VOMITING THAT IS NOT CONTROLLED WITH YOUR NAUSEA MEDICATION *UNUSUAL SHORTNESS OF BREATH *UNUSUAL BRUISING OR BLEEDING *URINARY PROBLEMS (pain or burning when urinating, or frequent urination) *BOWEL PROBLEMS (unusual diarrhea, constipation, pain near the anus) TENDERNESS IN MOUTH AND THROAT WITH OR WITHOUT PRESENCE OF ULCERS (sore throat, sores in mouth, or a toothache) UNUSUAL RASH, SWELLING OR PAIN  UNUSUAL VAGINAL DISCHARGE OR ITCHING   Items with * indicate a potential emergency and should be followed up as soon as possible or go to the Emergency Department if any problems should occur.  Please show the CHEMOTHERAPY ALERT CARD or IMMUNOTHERAPY  ALERT CARD at check-in to the Emergency Department and triage nurse.  Should you have questions after your visit or need to cancel or reschedule your appointment, please contact [facility] [place] [facility] [place]  [phone] and follow the prompts.  Office hours are 8:00 a.m. to 4:30 p.m. Monday - Friday. Please note that voicemails left after 4:00 p.m. may not be returned until the following business day.  We are closed weekends and major holidays. You have access to a nurse at all times for urgent questions. Please call the main number to the clinic [phone] and follow the prompts.  For any non-urgent questions, you may also contact your provider using MyChart. We now offer e-Visits for anyone 50 and older to request care online for non-urgent symptoms. For details visit [email or web address].   Also download the MyChart app! Go to the app store, search "MyChart", open the app, select [facility], and log in with your MyChart username and password.  Masks are optional in the cancer centers. If you would like for your care team to wear a mask while they are taking care of you, please let them know. For doctor visits, patients may have with them one support person who is at least 50 years old. At this time, visitors are not allowed in the infusion area.

## 2022-04-24 NOTE — Assessment & Plan Note (Addendum)
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 April 2023-brain MRI-4 mm x 2 metastases noted.  Currently on [place] etoposide-Tecentriq every 3 weeks x 4 cycles; followed by maintenance Tecentriq post chemotherapy. JUNE 26th, 2023- IMPRESSION: Significant response to therapy [both in lung and liver; no new disease]; 4 mm right lower lobe nodule-monitor.  See incidental findings.  #Proceed with cycle #4-  of carboplatin etoposide-Tecentriq; labs today reviewed;  acceptable for treatment today-except for elevated LFTs see below. JUNE 2023- TSH- WNL.  Again reviewed that patient will proceed with maintenance immunotherapy starting next cycle.   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  #Hypokalemia potassium 3.7 secondary chemotherapy--STABLE-continue K-Dur one a day.  # #Brain metastases : April 2023-MRI brain 4 mm x 2 [asymptomatic/screening MRI]-NO WBRT.  Status post chemo-chemotherapy.  JUNE 2023-resolution of brain metastases. STABLE.   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 s/p Zometa-4 mg IV every  6 weeks.   #Left chest wall skin rash anterior-appears allergic; no concerns for shingles.  Recommend hydrocortisone ointment.  # #Incidental findings on Imaging JUNE , 2023 CT scan: Emphysema; gallbladder I reviewed/discussed/counseled the patient.   # Smoking: Active smoker;  the process of quitting. STABLE.   # COPD: Continue inhalers [Dr.Aleskerov]- STABLE.   # DISPOSITION: # chemo today and chemo this week as planned;  D-4 Udenyca  #  NO Zometa today   # follow up in 3 weeks with NP;labs- cbc/cmp- Tecentriq;ONLY  # follow up in 6 weeks with MD; Gildardo Pounds; Zometa-  Dr.B.   # I reviewed the blood work- with the patient in detail; also reviewed the imaging independently [as summarized  above]; and with the patient in detail.

## 2022-04-26 ENCOUNTER — Inpatient Hospital Stay: Payer: BC Managed Care – PPO

## 2022-04-26 VITALS — BP 117/73 | HR 61 | Temp 98.1°F | Resp 16

## 2022-04-26 DIAGNOSIS — Z5112 Encounter for antineoplastic immunotherapy: Secondary | ICD-10-CM | POA: Diagnosis not present

## 2022-04-26 DIAGNOSIS — C3412 Malignant neoplasm of upper lobe, left bronchus or lung: Secondary | ICD-10-CM

## 2022-04-26 MED ORDER — SODIUM CHLORIDE 0.9 % IV SOLN
100.0000 mg/m2 | Freq: Once | INTRAVENOUS | Status: AC
  Administered 2022-04-26: 180 mg via INTRAVENOUS
  Filled 2022-04-26: qty 9

## 2022-04-26 MED ORDER — SODIUM CHLORIDE 0.9 % IV SOLN
10.0000 mg | Freq: Once | INTRAVENOUS | Status: AC
  Administered 2022-04-26: 10 mg via INTRAVENOUS
  Filled 2022-04-26: qty 1

## 2022-04-26 MED ORDER — SODIUM CHLORIDE 0.9% FLUSH
10.0000 mL | INTRAVENOUS | Status: DC | PRN
  Filled 2022-04-26: qty 10

## 2022-04-26 MED ORDER — SODIUM CHLORIDE 0.9 % IV SOLN
Freq: Once | INTRAVENOUS | Status: AC
  Filled 2022-04-26: qty 250

## 2022-04-26 MED ORDER — HEPARIN SOD (PORK) LOCK FLUSH 100 UNIT/ML IV SOLN
500.0000 [IU] | Freq: Once | INTRAVENOUS | Status: AC | PRN
  Administered 2022-04-26: 500 [IU]
  Filled 2022-04-26: qty 5

## 2022-04-27 ENCOUNTER — Inpatient Hospital Stay: Payer: BC Managed Care – PPO

## 2022-04-27 VITALS — BP 128/83 | HR 71 | Temp 97.1°F | Resp 16

## 2022-04-27 DIAGNOSIS — C3412 Malignant neoplasm of upper lobe, left bronchus or lung: Secondary | ICD-10-CM

## 2022-04-27 DIAGNOSIS — Z5112 Encounter for antineoplastic immunotherapy: Secondary | ICD-10-CM | POA: Diagnosis not present

## 2022-04-27 MED ORDER — SODIUM CHLORIDE 0.9 % IV SOLN
10.0000 mg | Freq: Once | INTRAVENOUS | Status: AC
  Administered 2022-04-27: 10 mg via INTRAVENOUS
  Filled 2022-04-27: qty 1

## 2022-04-27 MED ORDER — SODIUM CHLORIDE 0.9 % IV SOLN
Freq: Once | INTRAVENOUS | Status: AC
  Filled 2022-04-27: qty 250

## 2022-04-27 MED ORDER — HEPARIN SOD (PORK) LOCK FLUSH 100 UNIT/ML IV SOLN
500.0000 [IU] | Freq: Once | INTRAVENOUS | Status: AC | PRN
  Administered 2022-04-27: 500 [IU]
  Filled 2022-04-27: qty 5

## 2022-04-27 MED ORDER — SODIUM CHLORIDE 0.9 % IV SOLN
100.0000 mg/m2 | Freq: Once | INTRAVENOUS | Status: AC
  Administered 2022-04-27: 180 mg via INTRAVENOUS
  Filled 2022-04-27: qty 9

## 2022-04-27 MED ORDER — SODIUM CHLORIDE 0.9% FLUSH
10.0000 mL | INTRAVENOUS | Status: DC | PRN
  Administered 2022-04-27: 10 mL
  Filled 2022-04-27: qty 10

## 2022-04-28 ENCOUNTER — Inpatient Hospital Stay: Payer: BC Managed Care – PPO

## 2022-04-28 DIAGNOSIS — Z5112 Encounter for antineoplastic immunotherapy: Secondary | ICD-10-CM | POA: Diagnosis not present

## 2022-04-28 DIAGNOSIS — C3412 Malignant neoplasm of upper lobe, left bronchus or lung: Secondary | ICD-10-CM

## 2022-04-28 MED ORDER — PEGFILGRASTIM-CBQV 6 MG/0.6ML ~~LOC~~ SOSY
6.0000 mg | PREFILLED_SYRINGE | Freq: Once | SUBCUTANEOUS | Status: AC
  Administered 2022-04-28: 6 mg via SUBCUTANEOUS
  Filled 2022-04-28: qty 0.6

## 2022-05-15 ENCOUNTER — Inpatient Hospital Stay: Payer: BC Managed Care – PPO

## 2022-05-15 ENCOUNTER — Inpatient Hospital Stay (HOSPITAL_BASED_OUTPATIENT_CLINIC_OR_DEPARTMENT_OTHER): Payer: BC Managed Care – PPO | Admitting: Medical Oncology

## 2022-05-15 ENCOUNTER — Other Ambulatory Visit: Payer: Self-pay

## 2022-05-15 ENCOUNTER — Encounter: Payer: Self-pay | Admitting: Medical Oncology

## 2022-05-15 VITALS — BP 137/86 | HR 88 | Temp 98.2°F | Resp 20 | Ht 67.0 in | Wt 153.0 lb

## 2022-05-15 DIAGNOSIS — C3490 Malignant neoplasm of unspecified part of unspecified bronchus or lung: Secondary | ICD-10-CM | POA: Diagnosis not present

## 2022-05-15 DIAGNOSIS — C7931 Secondary malignant neoplasm of brain: Secondary | ICD-10-CM

## 2022-05-15 DIAGNOSIS — R7989 Other specified abnormal findings of blood chemistry: Secondary | ICD-10-CM

## 2022-05-15 DIAGNOSIS — C3412 Malignant neoplasm of upper lobe, left bronchus or lung: Secondary | ICD-10-CM

## 2022-05-15 DIAGNOSIS — E876 Hypokalemia: Secondary | ICD-10-CM

## 2022-05-15 DIAGNOSIS — Z5112 Encounter for antineoplastic immunotherapy: Secondary | ICD-10-CM | POA: Diagnosis not present

## 2022-05-15 DIAGNOSIS — G893 Neoplasm related pain (acute) (chronic): Secondary | ICD-10-CM

## 2022-05-15 LAB — CBC WITH DIFFERENTIAL/PLATELET
Abs Immature Granulocytes: 0.65 10*3/uL — ABNORMAL HIGH (ref 0.00–0.07)
Basophils Absolute: 0.1 10*3/uL (ref 0.0–0.1)
Basophils Relative: 1 %
Eosinophils Absolute: 0 10*3/uL (ref 0.0–0.5)
Eosinophils Relative: 0 %
HCT: 26.2 % — ABNORMAL LOW (ref 39.0–52.0)
Hemoglobin: 8.6 g/dL — ABNORMAL LOW (ref 13.0–17.0)
Immature Granulocytes: 8 %
Lymphocytes Relative: 16 %
Lymphs Abs: 1.2 10*3/uL (ref 0.7–4.0)
MCH: 33.9 pg (ref 26.0–34.0)
MCHC: 32.8 g/dL (ref 30.0–36.0)
MCV: 103.1 fL — ABNORMAL HIGH (ref 80.0–100.0)
Monocytes Absolute: 1.4 10*3/uL — ABNORMAL HIGH (ref 0.1–1.0)
Monocytes Relative: 17 %
Neutro Abs: 4.5 10*3/uL (ref 1.7–7.7)
Neutrophils Relative %: 58 %
Platelets: 139 10*3/uL — ABNORMAL LOW (ref 150–400)
RBC: 2.54 MIL/uL — ABNORMAL LOW (ref 4.22–5.81)
RDW: 22.2 % — ABNORMAL HIGH (ref 11.5–15.5)
WBC: 7.8 10*3/uL (ref 4.0–10.5)
nRBC: 1.8 % — ABNORMAL HIGH (ref 0.0–0.2)

## 2022-05-15 LAB — COMPREHENSIVE METABOLIC PANEL
ALT: 50 U/L — ABNORMAL HIGH (ref 0–44)
AST: 35 U/L (ref 15–41)
Albumin: 4.1 g/dL (ref 3.5–5.0)
Alkaline Phosphatase: 80 U/L (ref 38–126)
Anion gap: 7 (ref 5–15)
BUN: 15 mg/dL (ref 6–20)
CO2: 24 mmol/L (ref 22–32)
Calcium: 8.9 mg/dL (ref 8.9–10.3)
Chloride: 108 mmol/L (ref 98–111)
Creatinine, Ser: 0.86 mg/dL (ref 0.61–1.24)
GFR, Estimated: >60 mL/min (ref >60)
Glucose, Bld: 127 mg/dL — ABNORMAL HIGH (ref 70–99)
Potassium: 3.2 mmol/L — ABNORMAL LOW (ref 3.5–5.1)
Sodium: 139 mmol/L (ref 135–145)
Total Bilirubin: 0.9 mg/dL (ref 0.3–1.2)
Total Protein: 7.4 g/dL (ref 6.5–8.1)

## 2022-05-15 MED ORDER — HEPARIN SOD (PORK) LOCK FLUSH 100 UNIT/ML IV SOLN
500.0000 [IU] | Freq: Once | INTRAVENOUS | Status: AC | PRN
  Administered 2022-05-15: 500 [IU]
  Filled 2022-05-15: qty 5

## 2022-05-15 MED ORDER — POTASSIUM CHLORIDE 20 MEQ/100ML IV SOLN
20.0000 meq | Freq: Once | INTRAVENOUS | Status: AC
  Administered 2022-05-15: 20 meq via INTRAVENOUS

## 2022-05-15 MED ORDER — SODIUM CHLORIDE 0.9 % IV SOLN
Freq: Once | INTRAVENOUS | Status: AC
  Filled 2022-05-15: qty 250

## 2022-05-15 MED ORDER — SODIUM CHLORIDE 0.9 % IV SOLN: Freq: Once | INTRAVENOUS | Status: DC

## 2022-05-15 MED ORDER — TRAMADOL HCL 50 MG PO TABS
50.0000 mg | ORAL_TABLET | Freq: Four times a day (QID) | ORAL | 0 refills | Status: DC | PRN
Start: 2022-05-15 — End: 2022-06-09

## 2022-05-15 MED ORDER — SODIUM CHLORIDE 0.9 % IV SOLN
1200.0000 mg | Freq: Once | INTRAVENOUS | Status: AC
  Administered 2022-05-15: 1200 mg via INTRAVENOUS
  Filled 2022-05-15: qty 20

## 2022-05-15 MED ORDER — OXYCODONE-ACETAMINOPHEN 5-325 MG PO TABS: 1.0000 | ORAL_TABLET | Freq: Three times a day (TID) | ORAL | 0 refills | Status: DC | PRN

## 2022-05-15 NOTE — Progress Notes (Signed)
[place] CONSULT NOTE  Patient Care Team: Tracie Harrier, MD as PCP - General (Internal Medicine) Telford Nab, RN as Oncology Nurse Navigator Cammie Sickle, MD as Consulting Physician (Oncology)  CHIEF COMPLAINTS/PURPOSE OF CONSULTATION: lung cancer  #  Oncology History Overview Note  # . LUNG, LEFT UPPER LOBE; ENB-GUIDED TRANSBRONCHIAL FORCEPS BIOPSY: - SMALL CELL CARCINOMA.  [Dr.Aleskerov]  Comment:  Invasive carcinoma is present in two fragments, with crush artifact. The  cells are small, with scant cytoplasm, nuclear molding, and  inconspicuous nucleoli. There is necrosis.   Immunohistochemistry (IHC) was performed for further characterization.  The neoplastic cells are positive for CD56 with strong staining of over  90% of cells. They are negative for TTF-1 and p40.    IMPRESSION: 1. LEFT suprahilar mass constricting LEFT upper lobe bronchus consistent with primary bronchogenic carcinoma. 2. Ipsilateral mediastinal nodal metastasis. 3. Distant visceral metastasis to the LIVER and bilateral ADRENAL GLANDS. 4. Distant nodal metastasis to the LEFT external iliac node. 5. Multifocal hypermetabolic skeletal metastasis (approximately 40 lesions). Lesions are occult by CT imaging. 6. Focal activity beneath the skin posterior to the RIGHT ear. Differential includes nodal metastasis versus primary parotid neoplasm. Favor nodal metastasis. (Recommend attention on brain MRI workup).     Electronically Signed   By: Suzy Bouchard M.D.   On: 02/06/2022 13:00  # April 2023-brain MRI incidental/screening 4 mm x 2 cerebellar lesions.  # MAY 1st, 2023- carboo-Etop [d-1-3]; udenyca   Primary cancer of left upper lobe of lung ([place])  02/16/2022 Initial Diagnosis   Primary cancer of left upper lobe of lung ([place])   02/16/2022 Cancer Staging   Staging form: Lung, AJCC 8th Edition - Clinical: Stage IVB (cT3, cN2, pM1c) - Signed by Cammie Sickle,  MD on 02/16/2022 Stage prefix: Initial diagnosis   02/20/2022 -  Chemotherapy   Patient is on Treatment Plan : LUNG SCLC Carboplatin + Etoposide + Atezolizumab Induction q21d / Atezolizumab Maintenance q21d        HISTORY OF PRESENTING ILLNESS: Ambulating independently.  Alone.   Jose Morse 51 y.o.  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   Denies any worsening shortness of breath or cough.  Denies hemoptysis.  Denies any worsening headaches.  Appetite is good.  No weight loss.   Review of Systems  Constitutional:  Positive for malaise/fatigue. Negative for chills, diaphoresis, fever and weight loss.  HENT:  Negative for nosebleeds and sore throat.   Eyes:  Negative for double vision.  Respiratory:  Positive for wheezing. Negative for cough, hemoptysis, sputum production and shortness of breath.   Cardiovascular:  Positive for chest pain. Negative for palpitations, orthopnea and leg swelling.  Gastrointestinal:  Negative for abdominal pain, blood in stool, constipation, diarrhea, heartburn, melena, nausea and vomiting.  Genitourinary:  Negative for dysuria, frequency and urgency.  Musculoskeletal:  Positive for back pain and joint pain.  Skin: Negative.  Negative for itching and rash.  Neurological:  Negative for dizziness, tingling, focal weakness, weakness and headaches.  Endo/Heme/Allergies:  Does not bruise/bleed easily.  Psychiatric/Behavioral:  Negative for depression. The patient is not nervous/anxious and does not have insomnia.      MEDICAL HISTORY:  Past Medical History:  Diagnosis Date   History of kidney stones    Hypercholesteremia  SURGICAL HISTORY: Past Surgical History:  Procedure Laterality Date   CHOLECYSTECTOMY     IR IMAGING GUIDED PORT INSERTION   02/23/2022   VIDEO BRONCHOSCOPY WITH ENDOBRONCHIAL NAVIGATION N/A 02/10/2022   Procedure: VIDEO BRONCHOSCOPY WITH ENDOBRONCHIAL NAVIGATION;  Surgeon: Ottie Glazier, MD;  Location: [facility] ORS;  Service: Thoracic;  Laterality: N/A;   VIDEO BRONCHOSCOPY WITH ENDOBRONCHIAL ULTRASOUND N/A 02/10/2022   Procedure: VIDEO BRONCHOSCOPY WITH ENDOBRONCHIAL ULTRASOUND;  Surgeon: Ottie Glazier, MD;  Location: [facility] ORS;  Service: Thoracic;  Laterality: N/A;    SOCIAL HISTORY: Social History   Socioeconomic History   Marital status: Married    Spouse name: Not on file   Number of children: Not on file   Years of education: Not on file   Highest education level: Not on file  Occupational History   Not on file  Tobacco Use   Smoking status: Every Day    Packs/day: 0.50    Years: 34.00    Total pack years: 17.00    Types: Cigarettes   Smokeless tobacco: Never  Vaping Use   Vaping Use: Never used  Substance and Sexual Activity   Alcohol use: No   Drug use: Not Currently    Types: Marijuana   Sexual activity: Yes  Other Topics Concern   Not on file  Social History Narrative   Lives in [place]; with wife/son- 69 y [2023]; works in [employer]. Smoker; no alcohol.    Social Determinants of Health   Financial Resource Strain: Not on file  Food Insecurity: Not on file  Transportation Needs: Not on file  Physical Activity: Not on file  Stress: Not on file  Social Connections: Not on file  Intimate Partner Violence: Not on file    FAMILY HISTORY: Family History  Problem Relation Age of Onset   Ovarian cancer Mother    Colon cancer Father    Diabetes Father     ALLERGIES:  is allergic to codeine.  MEDICATIONS:  Current Outpatient Medications  Medication Sig Dispense Refill   albuterol (PROVENTIL HFA) 108 (90 Base) MCG/ACT inhaler Inhale 2 puffs into the lungs every 4 (four) hours as needed for wheezing or shortness of breath. 1 each 0   Budeson-Glycopyrrol-Formoterol (BREZTRI  AEROSPHERE) 160-9-4.8 MCG/ACT AERO Inhale 2 puffs into the lungs in the morning and at bedtime.     lidocaine-prilocaine (EMLA) cream Apply on the port. 30 -45 min  prior to port access. 30 g 3   ondansetron (ZOFRAN) 8 MG tablet One pill every 8 hours as needed for nausea/vomitting. 40 tablet 1   oxyCODONE-acetaminophen (PERCOCET/ROXICET) 5-325 MG tablet Take 1 tablet by mouth every 8 (eight) hours as needed for severe pain. 60 tablet 0   potassium chloride SA (KLOR-CON M) 20 MEQ tablet 1 pill twice a day (Patient taking differently: 20 mEq daily. 1 pill twice a day) 60 tablet 3   prochlorperazine (COMPAZINE) 10 MG tablet Take 1 tablet (10 mg total) by mouth every 6 (six) hours as needed for nausea or vomiting. 40 tablet 1   rosuvastatin (CRESTOR) 5 MG tablet Take 5 mg by mouth at bedtime.     traMADol (ULTRAM) 50 MG tablet Take 1 tablet (50 mg total) by mouth every 6 (six) hours as needed. 60 tablet 0   No current facility-administered medications for this visit.      Left chest wall skin rash anterior-maculopapular skin rash.;  Not in dermatomal distribution.  PHYSICAL EXAMINATION: ECOG PERFORMANCE STATUS: 1 - Symptomatic but completely ambulatory  Vitals:  05/15/22 0858  BP: 137/86  Pulse: 88  Resp: 20  Temp: 98.2 F (36.8 C)  SpO2: 100%   Filed Weights   05/15/22 0858  Weight: 153 lb (69.4 kg)    Physical Exam Vitals and nursing note reviewed.  HENT:     Head: Normocephalic and atraumatic.     Mouth/Throat:     Pharynx: Oropharynx is clear.  Eyes:     Extraocular Movements: Extraocular movements intact.     Pupils: Pupils are equal, round, and reactive to light.  Cardiovascular:     Rate and Rhythm: Normal rate and regular rhythm.  Pulmonary:     Comments: Decreased breath sounds bilaterally.  Abdominal:     Palpations: Abdomen is soft.  Musculoskeletal:        General: Normal range of motion.     Cervical back: Normal range of motion.  Skin:    General: Skin  is warm.  Neurological:     General: No focal deficit present.     Mental Status: He is alert and oriented to person, place, and time.  Psychiatric:        Behavior: Behavior normal.        Judgment: Judgment normal.      LABORATORY DATA:  I have reviewed the data as listed Lab Results  Component Value Date   WBC 9.6 04/24/2022   HGB 9.5 (L) 04/24/2022   HCT 29.4 (L) 04/24/2022   MCV 99.0 04/24/2022   PLT 185 04/24/2022   Recent Labs    03/21/22 1254 04/03/22 0758 04/24/22 0904  NA 134* 137 138  K 3.8 4.0 3.7  CL 103 107 105  CO2 25 24 23   GLUCOSE 118* 121* 117*  BUN 13 11 15   CREATININE 0.69 0.78 0.83  CALCIUM 8.9 8.8* 9.1  GFRNONAA >60 >60 >60  PROT 6.8 7.4 7.4  ALBUMIN 3.7 3.7 3.9  AST 21 33 35  ALT 37 54* 44  ALKPHOS 169* 122 82  BILITOT 0.5 0.8 0.8    RADIOGRAPHIC STUDIES: I have personally reviewed the radiological images as listed and agreed with the findings in the report. MR BRAIN W WO CONTRAST  Result Date: 04/17/2022 CLINICAL DATA:  Metastatic lung cancer EXAM: MRI HEAD WITHOUT AND WITH CONTRAST TECHNIQUE: Multiplanar, multiecho pulse sequences of the brain and surrounding structures were obtained without and with intravenous contrast. CONTRAST:  64mL GADAVIST GADOBUTROL 1 MMOL/ML IV SOLN COMPARISON:  02/16/2022 FINDINGS: Brain: Foci of cerebellar enhancement have resolved. No new mass or abnormal enhancement. There is no acute infarction or intracranial hemorrhage. No mass effect or edema. There is no hydrocephalus or extra-axial fluid collection. Ventricles and sulci are normal in size and configuration. Vascular: Major vessel flow voids at the skull base are preserved. Skull and upper cervical spine: Stable abnormal marrow signal within the high posterior right parietal calvarium. Previously seen C4 metastasis is not well evaluated. Sinuses/Orbits: Paranasal sinus mucosal thickening. Orbits are unremarkable. Other: Right periauricular lymph node posterior  to the parotid is no longer identified. Sella is unremarkable. Mastoid air cells are clear. IMPRESSION: Resolution of cerebellar lesions. No new intracranial mass or abnormal enhancement. 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 Resolution of previously seen right periauricular lymph node. Electronically Signed   By: Macy Mis M.D.   On: 04/17/2022 14:09   CT CHEST ABDOMEN PELVIS W CONTRAST  Result Date: 04/17/2022 CLINICAL DATA:  Small cell lung cancer, assess treatment response. * Tracking Code: BO * EXAM: CT CHEST, ABDOMEN, AND  PELVIS WITH CONTRAST TECHNIQUE: Multidetector CT imaging of the chest, abdomen and pelvis was performed following the standard protocol during bolus administration of intravenous contrast. RADIATION DOSE REDUCTION: This exam was performed according to the departmental dose-optimization program which includes automated exposure control, adjustment of the mA and/or kV according to patient size and/or use of iterative reconstruction technique. CONTRAST:  144mL OMNIPAQUE IOHEXOL 350 MG/ML SOLN COMPARISON:  CT chest 02/09/2022, 01/03/2022 and PET 02/06/2022. FINDINGS: CT CHEST FINDINGS Cardiovascular: Right IJ Port-A-Cath terminates in the high right atrium. Heart size normal. No pericardial effusion. Mediastinum/Nodes: No pathologically enlarged mediastinal, hilar or axillary lymph nodes. Minimal residual soft tissue thickening in the left hilum. Esophagus is grossly unremarkable. Lungs/Pleura: Mild centrilobular emphysema. 4 mm posterior right lower lobe nodule (4/100), stable. Previously seen perihilar left upper lobe mass has nearly completely resolved with minimal residual soft tissue thickening in the left hilum as well as a tiny area of nodular consolidation in the posterolateral left upper lobe, measuring 5 mm (4/54). Perifissural lymph node along the left major fissure. No pleural fluid. Debris in the airway. Musculoskeletal: No worrisome lytic or sclerotic  lesions. CT ABDOMEN PELVIS FINDINGS Hepatobiliary: Hypodense metastasis in the peripheral left hepatic lobe has decreased in size, now measuring 1.1 x 1.3 cm (2/61), previously approximately 2.4 x 2.7 cm. Blush of hyperattenuation in the peripheral right hepatic lobe measures 2.0 x 2.2 cm and corresponds to a low-attenuation lesion on 01/03/2022. No associated hypermetabolism on PET 02/06/2022. Findings favor a hemangioma. Cholecystectomy. No biliary ductal dilatation. Pancreas: Negative. Spleen: Negative. Adrenals/Urinary Tract: Adrenal glands are unremarkable. Scarring in the lower pole right kidney. Kidneys are otherwise unremarkable. Ureters are decompressed. Bladder is thick-walled and rather low in volume. Stomach/Bowel: Stomach, small bowel, appendix and colon are unremarkable. Vascular/Lymphatic: Vascular structures are unremarkable. No pathologically enlarged lymph nodes. Iliac chain lymph nodes measure up to 7 mm on the right (2/110). Reproductive: Prostate is visualized. Other: No free fluid. Left inguinal hernia contains fat. Mesenteries and peritoneum are unremarkable. There is a soft tissue tract from the anus to the medial right buttock skin surface (2/126). No definite organized fluid collection. Musculoskeletal: No worrisome lytic or sclerotic lesions. IMPRESSION: 1. Profound response to therapy with minimal residual soft tissue thickening in the left hilum and a tiny area of nodular consolidation in the posterolateral left upper lobe. Small residual left hepatic lobe metastasis. No additional evidence of metastatic disease. 2. 4 mm posterior right lower lobe nodule, stable. Recommend continued attention on follow-up. 3. Thick-walled bladder. 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 5.  Emphysema (ICD10-J43.9). Electronically Signed   By: Lorin Picket M.D.   On: 04/17/2022 13:54    ASSESSMENT & PLAN:   No problem-specific Assessment & Plan notes found for this encounter.  Encounter  Diagnoses  Name Primary?   Primary cancer of left upper lobe of lung ([place]) Yes   Lung cancer metastatic to brain [facility])    Elevated liver function tests    Hypokalemia    Hypercalcemia    Cancer related pain    Lung Cancer: #Extensive stage small cell lung cancer /stage IV. April 2023-brain MRI-4 mm x 2 metastases noted.  Finished chemotherapy last visit. Now on Tecentriq post chemotherapy. Discussed and reviewed labs.  See above Found on recent CMP. CMP today shows stable mild elevation not high enough to impact treatment. Will monitor with follow up next week.  Acute on chronic. Giving 20 MEQ today of K, He will continue his home potassium.  RTC one week for recheck.  Resolved Chronic in nature and stable. Secondary to stage IV disease related pain. Uses tramadol more often PRN than oxycodone. Oxycodone for more severe pain. Denies using them together. Needs refill on both today.    PLAN: Tecentriq today 20 MEQ K today RTC 1 week with APP with labs (CBC, CMP, magnesium) +- K/IVF-[place]   All questions were answered. The patient knows to call the clinic with any problems, questions or concerns.       Hughie Closs, PA-C 05/15/2022 9:08 AM

## 2022-05-23 ENCOUNTER — Inpatient Hospital Stay: Payer: BC Managed Care – PPO

## 2022-05-23 ENCOUNTER — Inpatient Hospital Stay: Payer: BC Managed Care – PPO | Attending: Internal Medicine

## 2022-05-23 ENCOUNTER — Encounter: Payer: Self-pay | Admitting: Nurse Practitioner

## 2022-05-23 ENCOUNTER — Inpatient Hospital Stay (HOSPITAL_BASED_OUTPATIENT_CLINIC_OR_DEPARTMENT_OTHER): Payer: BC Managed Care – PPO | Admitting: Nurse Practitioner

## 2022-05-23 VITALS — BP 141/92 | HR 82 | Temp 96.9°F | Resp 18 | Ht 67.0 in | Wt 154.0 lb

## 2022-05-23 DIAGNOSIS — C7951 Secondary malignant neoplasm of bone: Secondary | ICD-10-CM | POA: Diagnosis not present

## 2022-05-23 DIAGNOSIS — R7401 Elevation of levels of liver transaminase levels: Secondary | ICD-10-CM | POA: Diagnosis not present

## 2022-05-23 DIAGNOSIS — C7931 Secondary malignant neoplasm of brain: Secondary | ICD-10-CM

## 2022-05-23 DIAGNOSIS — R519 Headache, unspecified: Secondary | ICD-10-CM | POA: Diagnosis not present

## 2022-05-23 DIAGNOSIS — J449 Chronic obstructive pulmonary disease, unspecified: Secondary | ICD-10-CM | POA: Diagnosis not present

## 2022-05-23 DIAGNOSIS — T451X5A Adverse effect of antineoplastic and immunosuppressive drugs, initial encounter: Secondary | ICD-10-CM | POA: Diagnosis not present

## 2022-05-23 DIAGNOSIS — F17210 Nicotine dependence, cigarettes, uncomplicated: Secondary | ICD-10-CM | POA: Insufficient documentation

## 2022-05-23 DIAGNOSIS — Z800 Family history of malignant neoplasm of digestive organs: Secondary | ICD-10-CM | POA: Diagnosis not present

## 2022-05-23 DIAGNOSIS — C3412 Malignant neoplasm of upper lobe, left bronchus or lung: Secondary | ICD-10-CM | POA: Diagnosis present

## 2022-05-23 DIAGNOSIS — D6481 Anemia due to antineoplastic chemotherapy: Secondary | ICD-10-CM | POA: Insufficient documentation

## 2022-05-23 DIAGNOSIS — R062 Wheezing: Secondary | ICD-10-CM | POA: Diagnosis not present

## 2022-05-23 DIAGNOSIS — Z95828 Presence of other vascular implants and grafts: Secondary | ICD-10-CM

## 2022-05-23 DIAGNOSIS — G893 Neoplasm related pain (acute) (chronic): Secondary | ICD-10-CM | POA: Diagnosis not present

## 2022-05-23 DIAGNOSIS — R7989 Other specified abnormal findings of blood chemistry: Secondary | ICD-10-CM

## 2022-05-23 DIAGNOSIS — C3490 Malignant neoplasm of unspecified part of unspecified bronchus or lung: Secondary | ICD-10-CM

## 2022-05-23 DIAGNOSIS — E876 Hypokalemia: Secondary | ICD-10-CM | POA: Insufficient documentation

## 2022-05-23 DIAGNOSIS — Z8041 Family history of malignant neoplasm of ovary: Secondary | ICD-10-CM | POA: Insufficient documentation

## 2022-05-23 LAB — CBC WITH DIFFERENTIAL/PLATELET
Abs Immature Granulocytes: 0.01 10*3/uL (ref 0.00–0.07)
Basophils Absolute: 0.1 10*3/uL (ref 0.0–0.1)
Basophils Relative: 1 %
Eosinophils Absolute: 0 10*3/uL (ref 0.0–0.5)
Eosinophils Relative: 1 %
HCT: 30.3 % — ABNORMAL LOW (ref 39.0–52.0)
Hemoglobin: 9.8 g/dL — ABNORMAL LOW (ref 13.0–17.0)
Immature Granulocytes: 0 %
Lymphocytes Relative: 20 %
Lymphs Abs: 1.4 10*3/uL (ref 0.7–4.0)
MCH: 32.9 pg (ref 26.0–34.0)
MCHC: 32.3 g/dL (ref 30.0–36.0)
MCV: 101.7 fL — ABNORMAL HIGH (ref 80.0–100.0)
Monocytes Absolute: 1.4 10*3/uL — ABNORMAL HIGH (ref 0.1–1.0)
Monocytes Relative: 20 %
Neutro Abs: 4.1 10*3/uL (ref 1.7–7.7)
Neutrophils Relative %: 58 %
Platelets: 277 10*3/uL (ref 150–400)
RBC: 2.98 MIL/uL — ABNORMAL LOW (ref 4.22–5.81)
RDW: 20 % — ABNORMAL HIGH (ref 11.5–15.5)
WBC: 7.1 10*3/uL (ref 4.0–10.5)
nRBC: 0.3 % — ABNORMAL HIGH (ref 0.0–0.2)

## 2022-05-23 LAB — COMPREHENSIVE METABOLIC PANEL
ALT: 51 U/L — ABNORMAL HIGH (ref 0–44)
AST: 43 U/L — ABNORMAL HIGH (ref 15–41)
Albumin: 4.4 g/dL (ref 3.5–5.0)
Alkaline Phosphatase: 85 U/L (ref 38–126)
Anion gap: 10 (ref 5–15)
BUN: 13 mg/dL (ref 6–20)
CO2: 23 mmol/L (ref 22–32)
Calcium: 8.8 mg/dL — ABNORMAL LOW (ref 8.9–10.3)
Chloride: 105 mmol/L (ref 98–111)
Creatinine, Ser: 0.88 mg/dL (ref 0.61–1.24)
GFR, Estimated: >60 mL/min (ref >60)
Glucose, Bld: 125 mg/dL — ABNORMAL HIGH (ref 70–99)
Potassium: 3.4 mmol/L — ABNORMAL LOW (ref 3.5–5.1)
Sodium: 138 mmol/L (ref 135–145)
Total Bilirubin: 0.9 mg/dL (ref 0.3–1.2)
Total Protein: 7.8 g/dL (ref 6.5–8.1)

## 2022-05-23 MED ORDER — SODIUM CHLORIDE 0.9% FLUSH
10.0000 mL | Freq: Once | INTRAVENOUS | Status: AC
  Administered 2022-05-23: 10 mL via INTRAVENOUS
  Filled 2022-05-23: qty 10

## 2022-05-23 MED ORDER — SODIUM CHLORIDE 0.9 % IV SOLN
INTRAVENOUS | Status: DC
  Filled 2022-05-23 (×2): qty 250

## 2022-05-23 MED ORDER — HEPARIN SOD (PORK) LOCK FLUSH 100 UNIT/ML IV SOLN
500.0000 [IU] | Freq: Once | INTRAVENOUS | Status: AC
  Administered 2022-05-23: 500 [IU] via INTRAVENOUS
  Filled 2022-05-23: qty 5

## 2022-05-23 MED ORDER — SODIUM CHLORIDE 0.9 % IV SOLN
INTRAVENOUS | Status: DC
  Filled 2022-05-23: qty 250

## 2022-05-23 NOTE — Patient Instructions (Signed)
Rehydration, Adult Rehydration is the replacement of body fluids, salts, and minerals (electrolytes) that are lost during dehydration. Dehydration is when there is not enough water or other fluids in the body. This happens when you lose more fluids than you take in. Common causes of dehydration include: Not drinking enough fluids. This can occur when you are ill or doing activities that require a lot of energy, especially in hot weather. Conditions that cause loss of water or other fluids, such as diarrhea, vomiting, sweating, or urinating a lot. Other illnesses, such as fever or infection. Certain medicines, such as those that remove excess fluid from the body (diuretics). Symptoms of mild or moderate dehydration may include thirst, dry lips and mouth, and dizziness. Symptoms of severe dehydration may include increased heart rate, confusion, fainting, and not urinating. For severe dehydration, you may need to get fluids through an IV at the hospital. For mild or moderate dehydration, you can usually rehydrate at home by drinking certain fluids as told by your health care provider. What are the risks? Generally, rehydration is safe. However, taking in too much fluid (overhydration) can be a problem. This is rare. Overhydration can cause an electrolyte imbalance, kidney failure, or a decrease in salt (sodium) levels in the body. Supplies needed You will need an oral rehydration solution (ORS) if your health care provider tells you to use one. This is a drink to treat dehydration. It can be found in pharmacies and retail stores. How to rehydrate Fluids Follow instructions from your health care provider for rehydration. The kind of fluid and the amount you should drink depend on your condition. In general, you should choose drinks that you prefer. If told by your health care provider, drink an ORS. Make an ORS by following instructions on the package. Start by drinking small amounts, about  cup (120  mL) every 5-10 minutes. Slowly increase how much you drink until you have taken the amount recommended by your health care provider. Drink enough clear fluids to keep your urine pale yellow. If you were told to drink an ORS, finish it first, then start slowly drinking other clear fluids. Drink fluids such as: Water. This includes sparkling water and flavored water. Drinking only water can lead to having too little sodium in your body (hyponatremia). Follow the advice of your health care provider. Water from ice chips you suck on. Fruit juice with water you add to it (diluted). Sports drinks. Hot or cold herbal teas. Broth-based soups. Milk or milk products. Food Follow instructions from your health care provider about what to eat while you rehydrate. Your health care provider may recommend that you slowly begin eating regular foods in small amounts. Eat foods that contain a healthy balance of electrolytes, such as bananas, oranges, potatoes, tomatoes, and spinach. Avoid foods that are greasy or contain a lot of sugar. In some cases, you may get nutrition through a feeding tube that is passed through your nose and into your stomach (nasogastric tube, or NG tube). This may be done if you have uncontrolled vomiting or diarrhea. Beverages to avoid  Certain beverages may make dehydration worse. While you rehydrate, avoid drinking alcohol. How to tell if you are recovering from dehydration You may be recovering from dehydration if: You are urinating more often than before you started rehydrating. Your urine is pale yellow. Your energy level improves. You vomit less frequently. You have diarrhea less frequently. Your appetite improves or returns to normal. You feel less dizzy or less light-headed.   Your skin tone and color start to look more normal. Follow these instructions at home: Take over-the-counter and prescription medicines only as told by your health care provider. Do not take sodium  tablets. Doing this can lead to having too much sodium in your body (hypernatremia). Contact a health care provider if: You continue to have symptoms of mild or moderate dehydration, such as: Thirst. Dry lips. Slightly dry mouth. Dizziness. Dark urine or less urine than normal. Muscle cramps. You continue to vomit or have diarrhea. Get help right away if you: Have symptoms of dehydration that get worse. Have a fever. Have a severe headache. Have been vomiting and the following happens: Your vomiting gets worse or does not go away. Your vomit includes blood or green matter (bile). You cannot eat or drink without vomiting. Have problems with urination or bowel movements, such as: Diarrhea that gets worse or does not go away. Blood in your stool (feces). This may cause stool to look black and tarry. Not urinating, or urinating only a small amount of very dark urine, within 6-8 hours. Have trouble breathing. Have symptoms that get worse with treatment. These symptoms may represent a serious problem that is an emergency. Do not wait to see if the symptoms will go away. Get medical help right away. Call your local emergency services (911 in the [place] Do not drive yourself to the hospital. Summary Rehydration is the replacement of body fluids and minerals (electrolytes) that are lost during dehydration. Follow instructions from your health care provider for rehydration. The kind of fluid and amount you should drink depend on your condition. Slowly increase how much you drink until you have taken the amount recommended by your health care provider. Contact your health care provider if you continue to show signs of mild or moderate dehydration. This information is not intended to replace advice given to you by your health care provider. Make sure you discuss any questions you have with your health care provider. Document Revised: 12/10/2019 Document Reviewed: 10/20/2019 Elsevier Patient  Education  2023 [employer]  

## 2022-05-23 NOTE — Progress Notes (Signed)
Patient complains of having headaches

## 2022-05-23 NOTE — Progress Notes (Signed)
[facility] CONSULT NOTE  Patient Care Team: Tracie Harrier, MD as PCP - General (Internal Medicine) Telford Nab, RN as Oncology Nurse Navigator Cammie Sickle, MD as Consulting Physician (Oncology)  CHIEF COMPLAINTS/PURPOSE OF CONSULTATION: lung cancer  #  Oncology History Overview Note  # . LUNG, LEFT UPPER LOBE; ENB-GUIDED TRANSBRONCHIAL FORCEPS BIOPSY: - SMALL CELL CARCINOMA.  [Dr.Aleskerov]  Comment:  Invasive carcinoma is present in two fragments, with crush artifact. The  cells are small, with scant cytoplasm, nuclear molding, and  inconspicuous nucleoli. There is necrosis.   Immunohistochemistry (IHC) was performed for further characterization.  The neoplastic cells are positive for CD56 with strong staining of over  90% of cells. They are negative for TTF-1 and p40.    IMPRESSION: 1. LEFT suprahilar mass constricting LEFT upper lobe bronchus consistent with primary bronchogenic carcinoma. 2. Ipsilateral mediastinal nodal metastasis. 3. Distant visceral metastasis to the LIVER and bilateral ADRENAL GLANDS. 4. Distant nodal metastasis to the LEFT external iliac node. 5. Multifocal hypermetabolic skeletal metastasis (approximately 40 lesions). Lesions are occult by CT imaging. 6. Focal activity beneath the skin posterior to the RIGHT ear. Differential includes nodal metastasis versus primary parotid neoplasm. Favor nodal metastasis. (Recommend attention on brain MRI workup).     Electronically Signed   By: Suzy Bouchard M.D.   On: 02/06/2022 13:00  # April 2023-brain MRI incidental/screening 4 mm x 2 cerebellar lesions.  # MAY 1st, 2023- carboo-Etop [d-1-3]; udenyca   Primary cancer of left upper lobe of lung ([place])  02/16/2022 Initial Diagnosis   Primary cancer of left upper lobe of lung ([place])   02/16/2022 Cancer Staging   Staging form: Lung, AJCC 8th Edition - Clinical: Stage IVB (cT3, cN2, pM1c) - Signed by Cammie Sickle,  MD on 02/16/2022 Stage prefix: Initial diagnosis   02/20/2022 -  Chemotherapy   Patient is on Treatment Plan : LUNG SCLC Carboplatin + Etoposide + Atezolizumab Induction q21d / Atezolizumab Maintenance q21d        HISTORY OF PRESENTING ILLNESS: Ambulating independently. Alone.   [name] 51 y.o.  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  He has bone pain which is stable and unchanged.  Says that he tolerated Tecentriq better than previous chemotherapy.  Feels well.  Has some generalized headache.  Would like to receive IV fluids today.  No vision changes no associated nausea.  No interval falls.  Appetite is good and he denies weight loss.  no shortness of breath or cough.  No hemoptysis.  No new rash.  Review of Systems  Constitutional:  Negative for chills, diaphoresis, fever, malaise/fatigue and weight loss.  HENT:  Negative for nosebleeds and sore throat.   Eyes:  Negative for double vision.  Respiratory:  Negative for cough, hemoptysis, sputum production, shortness of breath and wheezing.   Cardiovascular:  Positive for chest pain. Negative for palpitations, orthopnea and leg swelling.  Gastrointestinal:  Negative for abdominal pain, blood in stool, constipation, diarrhea, heartburn, melena, nausea and vomiting.  Genitourinary:  Negative for dysuria, frequency and urgency.  Musculoskeletal:  Positive for back pain and joint pain.  Skin: Negative.  Negative for itching and rash.  Neurological:  Negative for dizziness, tingling, focal weakness, weakness and headaches.  Endo/Heme/Allergies:  Does not bruise/bleed easily.  Psychiatric/Behavioral:  Negative for depression. The patient is not nervous/anxious and does not have insomnia.      MEDICAL HISTORY:  Past Medical History:  Diagnosis Date   History of kidney stones    Hypercholesteremia     SURGICAL HISTORY: Past  Surgical History:  Procedure Laterality Date   CHOLECYSTECTOMY     IR IMAGING GUIDED PORT INSERTION  02/23/2022   VIDEO BRONCHOSCOPY WITH ENDOBRONCHIAL NAVIGATION N/A 02/10/2022   Procedure: VIDEO BRONCHOSCOPY WITH ENDOBRONCHIAL NAVIGATION;  Surgeon: Ottie Glazier, MD;  Location: [facility] ORS;  Service: Thoracic;  Laterality: N/A;   VIDEO BRONCHOSCOPY WITH ENDOBRONCHIAL ULTRASOUND N/A 02/10/2022   Procedure: VIDEO BRONCHOSCOPY WITH ENDOBRONCHIAL ULTRASOUND;  Surgeon: Ottie Glazier, MD;  Location: [facility] ORS;  Service: Thoracic;  Laterality: N/A;    SOCIAL HISTORY: Social History   Socioeconomic History   Marital status: Married    Spouse name: Not on file   Number of children: Not on file   Years of education: Not on file   Highest education level: Not on file  Occupational History   Not on file  Tobacco Use   Smoking status: Every Day    Packs/day: 0.50    Years: 34.00    Total pack years: 17.00    Types: Cigarettes   Smokeless tobacco: Never  Vaping Use   Vaping Use: Never used  Substance and Sexual Activity   Alcohol use: No   Drug use: Not Currently    Types: Marijuana   Sexual activity: Yes  Other Topics Concern   Not on file  Social History Narrative   Lives in [place]; with wife/son- 10 y [2023]; works in Architect. Smoker; no alcohol.    Social Determinants of Health   Financial Resource Strain: Not on file  Food Insecurity: Not on file  Transportation Needs: Not on file  Physical Activity: Not on file  Stress: Not on file  Social Connections: Not on file  Intimate Partner Violence: Not on file    FAMILY HISTORY: Family History  Problem Relation Age of Onset   Ovarian cancer Mother    Colon cancer Father    Diabetes Father     ALLERGIES:  is allergic to codeine.  MEDICATIONS:  Current Outpatient Medications  Medication Sig Dispense Refill   albuterol (PROVENTIL HFA) 108 (90 Base) MCG/ACT inhaler Inhale 2 puffs into the lungs every 4 (four) hours as  needed for wheezing or shortness of breath. 1 each 0   Budeson-Glycopyrrol-Formoterol (BREZTRI AEROSPHERE) 160-9-4.8 MCG/ACT AERO Inhale 2 puffs into the lungs in the morning and at bedtime.     lidocaine-prilocaine (EMLA) cream Apply on the port. 30 -45 min  prior to port access. 30 g 3   ondansetron (ZOFRAN) 8 MG tablet One pill every 8 hours as needed for nausea/vomitting. 40 tablet 1   oxyCODONE-acetaminophen (PERCOCET/ROXICET) 5-325 MG tablet Take 1 tablet by mouth every 8 (eight) hours as needed for severe pain. 60 tablet 0   potassium chloride SA (KLOR-CON M) 20 MEQ tablet 1 pill twice a day (Patient taking differently: 20 mEq daily. 1 pill twice a day) 60 tablet 3   prochlorperazine (COMPAZINE) 10 MG tablet Take 1 tablet (10 mg total) by mouth every 6 (six) hours as needed for nausea or vomiting. 40 tablet 1   rosuvastatin (CRESTOR) 5 MG tablet Take 5 mg by mouth at bedtime.     traMADol (ULTRAM) 50 MG tablet Take 1 tablet (50 mg total) by mouth every 6 (six) hours as needed. 60 tablet 0   No current facility-administered medications for this visit.   Facility-Administered Medications Ordered in Other Visits  Medication Dose Route  Frequency Provider Last Rate Last Admin   heparin lock flush 100 unit/mL  500 Units Intravenous Once Verlon Au, NP       sodium chloride flush (NS) 0.9 % injection 10 mL  10 mL Intravenous Once Verlon Au, NP        PHYSICAL EXAMINATION: ECOG PERFORMANCE STATUS: 1 - Symptomatic but completely ambulatory  Vitals:   05/23/22 0943  BP: (!) 141/92  Pulse: 82  Resp: 18  Temp: (!) 96.9 F (36.1 C)  SpO2: 100%   Filed Weights   05/23/22 0943  Weight: 154 lb (69.9 kg)    Physical Exam Vitals and nursing note reviewed.  HENT:     Head: Normocephalic and atraumatic.     Mouth/Throat:     Pharynx: Oropharynx is clear.  Eyes:     Extraocular Movements: Extraocular movements intact.     Pupils: Pupils are equal, round, and reactive to light.   Cardiovascular:     Rate and Rhythm: Normal rate and regular rhythm.  Pulmonary:     Comments: Decreased breath sounds bilaterally.  Abdominal:     Palpations: Abdomen is soft.  Musculoskeletal:        General: Normal range of motion.     Cervical back: Normal range of motion.  Skin:    General: Skin is warm.  Neurological:     General: No focal deficit present.     Mental Status: He is alert and oriented to person, place, and time.  Psychiatric:        Behavior: Behavior normal.        Judgment: Judgment normal.      LABORATORY DATA:  I have reviewed the data as listed Lab Results  Component Value Date   WBC 7.1 05/23/2022   HGB 9.8 (L) 05/23/2022   HCT 30.3 (L) 05/23/2022   MCV 101.7 (H) 05/23/2022   PLT 277 05/23/2022   Recent Labs    04/24/22 0904 05/15/22 0854 05/23/22 0933  NA 138 139 138  K 3.7 3.2* 3.4*  CL 105 108 105  CO2 23 24 23   GLUCOSE 117* 127* 125*  BUN 15 15 13   CREATININE 0.83 0.86 0.88  CALCIUM 9.1 8.9 8.8*  GFRNONAA >60 >60 >60  PROT 7.4 7.4 7.8  ALBUMIN 3.9 4.1 4.4  AST 35 35 43*  ALT 44 50* 51*  ALKPHOS 82 80 85  BILITOT 0.8 0.9 0.9    RADIOGRAPHIC STUDIES: I have personally reviewed the radiological images as listed and agreed with the findings in the report. No results found.  ASSESSMENT & PLAN:   No problem-specific Assessment & Plan notes found for this encounter.  No diagnosis found.  Extensive stage small cell lung cancer- April 2023-brain MRI-4 mm x 2 metastases noted.  Finished chemotherapy last visit. Now on Tecentriq post chemotherapy. Tolerated well. Follow up with Dr. Rogue Bussing as scheduled.  Lung cancer metastatic to brain- See above Elevated LFTs- Monitor closely. Avoid hepatotoxic substances. Monitor tramadol & oxycodone closely vs rotate off if LFTs worsen.  Hypokalemia- Continue home oral potassium. K improved to 3.4.   Hypercalcemia- Resolved. Ca 8.8 today.  Anemia- likely secondary to chemotherapy.  Hemoglobin improved to 9.8. Monitor.  Cancer related pain- continue as prescribed.   PLAN: IVF today Follow up as scheduled In interim, rtc as needed-la  All questions were answered. The patient knows to call the clinic with any problems, questions or concerns.   Verlon Au, NP 05/23/2022

## 2022-05-29 ENCOUNTER — Telehealth: Payer: Self-pay | Admitting: Oncology

## 2022-05-29 NOTE — Telephone Encounter (Signed)
Patient's wife called and reports new bone pain since last Friday, both shoulders, wrists, knees.  10 out of the 10 "Tramadol does not touch it" Oxycodone partially relieves his pain and he only has a few left.  Recommend him to increase oxycodone 5/325mg  2 tablets every 8 hour PRN.  Patient 's wife voices understanding.  Wife understands that narcotics are not able to be called in by on-call physicians If pain is excruciating, not relieved despite increased dose of oxycodone.  Recommend patient to go to emergency room for further evaluation..  Wife does not think that patient would like to go to emergency room.  I recommend over-the-counter ibuprofen 600 mg 1 dose, wife says that they were instructed by treating team not to take any NSAIDs  cc Dr. Aletha Halim team to follow-up in a.m.

## 2022-05-30 ENCOUNTER — Other Ambulatory Visit: Payer: Self-pay | Admitting: Internal Medicine

## 2022-05-30 ENCOUNTER — Other Ambulatory Visit: Payer: Self-pay

## 2022-05-30 MED ORDER — OXYCODONE-ACETAMINOPHEN 10-325 MG PO TABS: 1.0000 | ORAL_TABLET | Freq: Three times a day (TID) | ORAL | 0 refills | Status: DC | PRN

## 2022-05-30 NOTE — Telephone Encounter (Signed)
New dose for Oxycodone 10/325 Q8H prn #60, as directed by MD, pended for MD to send.

## 2022-05-30 NOTE — Telephone Encounter (Signed)
Patient's wife informed of new Oxycodone dose sent to pharmacy.

## 2022-05-30 NOTE — Addendum Note (Signed)
Addended by: Vanice Sarah on: 05/30/2022 01:25 PM   Modules accepted: Orders

## 2022-05-30 NOTE — Telephone Encounter (Signed)
Spoke to patient's wife.  Mr. Jose Morse took Oxycodone 5/325 mg 2 tabs as directed last night and this did help relieve his pain.  Requesting a refill be sent in for new Oxycodone dose instead of [facility] eval.

## 2022-06-01 ENCOUNTER — Telehealth: Payer: Self-pay | Admitting: Oncology

## 2022-06-01 NOTE — Telephone Encounter (Signed)
Wife called and reports that patient is sick.  Patient sleeps a lot, feels extremely weak, forehead temp 100.6 tonight, oral temp was 99. Wife is not sure if this is an accurate reading as patient keeps talking.  He has decreased appetite, nausea and threw up once yesterday. He tried nausea med today and nausea has improved.  Recommend patient to go to ER for evaluation. Patient does not want to go to hospital or urgent care. Encourage oral hydration. He may consider coming to cancer center for evaluation tomorrow.  cc this message to Dr.Brahmanday's team to follow up in AM and arrange him to come to cancer center for evaluation.

## 2022-06-02 ENCOUNTER — Other Ambulatory Visit: Payer: Self-pay | Admitting: *Deleted

## 2022-06-02 ENCOUNTER — Inpatient Hospital Stay (HOSPITAL_BASED_OUTPATIENT_CLINIC_OR_DEPARTMENT_OTHER): Payer: BC Managed Care – PPO | Admitting: Hospice and Palliative Medicine

## 2022-06-02 ENCOUNTER — Inpatient Hospital Stay: Payer: BC Managed Care – PPO

## 2022-06-02 ENCOUNTER — Other Ambulatory Visit: Payer: Self-pay | Admitting: Internal Medicine

## 2022-06-02 ENCOUNTER — Telehealth: Payer: Self-pay | Admitting: *Deleted

## 2022-06-02 ENCOUNTER — Encounter: Payer: Self-pay | Admitting: Hospice and Palliative Medicine

## 2022-06-02 ENCOUNTER — Other Ambulatory Visit: Payer: Self-pay

## 2022-06-02 VITALS — BP 151/96 | HR 91 | Temp 96.3°F | Resp 18 | Ht 67.0 in | Wt 149.5 lb

## 2022-06-02 DIAGNOSIS — E876 Hypokalemia: Secondary | ICD-10-CM

## 2022-06-02 DIAGNOSIS — E860 Dehydration: Secondary | ICD-10-CM

## 2022-06-02 DIAGNOSIS — R112 Nausea with vomiting, unspecified: Secondary | ICD-10-CM

## 2022-06-02 DIAGNOSIS — C3412 Malignant neoplasm of upper lobe, left bronchus or lung: Secondary | ICD-10-CM

## 2022-06-02 DIAGNOSIS — C3490 Malignant neoplasm of unspecified part of unspecified bronchus or lung: Secondary | ICD-10-CM

## 2022-06-02 DIAGNOSIS — Z95828 Presence of other vascular implants and grafts: Secondary | ICD-10-CM

## 2022-06-02 DIAGNOSIS — C7931 Secondary malignant neoplasm of brain: Secondary | ICD-10-CM

## 2022-06-02 LAB — COMPREHENSIVE METABOLIC PANEL
ALT: 50 U/L — ABNORMAL HIGH (ref 0–44)
AST: 35 U/L (ref 15–41)
Albumin: 4 g/dL (ref 3.5–5.0)
Alkaline Phosphatase: 123 U/L (ref 38–126)
Anion gap: 11 (ref 5–15)
BUN: 14 mg/dL (ref 6–20)
CO2: 27 mmol/L (ref 22–32)
Calcium: 9.4 mg/dL (ref 8.9–10.3)
Chloride: 97 mmol/L — ABNORMAL LOW (ref 98–111)
Creatinine, Ser: 0.92 mg/dL (ref 0.61–1.24)
GFR, Estimated: >60 mL/min (ref >60)
Glucose, Bld: 110 mg/dL — ABNORMAL HIGH (ref 70–99)
Potassium: 4 mmol/L (ref 3.5–5.1)
Sodium: 135 mmol/L (ref 135–145)
Total Bilirubin: 0.9 mg/dL (ref 0.3–1.2)
Total Protein: 7.8 g/dL (ref 6.5–8.1)

## 2022-06-02 LAB — CBC WITH DIFFERENTIAL/PLATELET
Abs Immature Granulocytes: 0.02 10*3/uL (ref 0.00–0.07)
Basophils Absolute: 0.1 10*3/uL (ref 0.0–0.1)
Basophils Relative: 1 %
Eosinophils Absolute: 0.1 10*3/uL (ref 0.0–0.5)
Eosinophils Relative: 1 %
HCT: 38.4 % — ABNORMAL LOW (ref 39.0–52.0)
Hemoglobin: 12.2 g/dL — ABNORMAL LOW (ref 13.0–17.0)
Immature Granulocytes: 0 %
Lymphocytes Relative: 15 %
Lymphs Abs: 1.2 10*3/uL (ref 0.7–4.0)
MCH: 31.9 pg (ref 26.0–34.0)
MCHC: 31.8 g/dL (ref 30.0–36.0)
MCV: 100.3 fL — ABNORMAL HIGH (ref 80.0–100.0)
Monocytes Absolute: 1.2 10*3/uL — ABNORMAL HIGH (ref 0.1–1.0)
Monocytes Relative: 14 %
Neutro Abs: 6 10*3/uL (ref 1.7–7.7)
Neutrophils Relative %: 69 %
Platelets: 242 10*3/uL (ref 150–400)
RBC: 3.83 MIL/uL — ABNORMAL LOW (ref 4.22–5.81)
RDW: 15.5 % (ref 11.5–15.5)
WBC: 8.6 10*3/uL (ref 4.0–10.5)
nRBC: 0 % (ref 0.0–0.2)

## 2022-06-02 LAB — MAGNESIUM: Magnesium: 1.9 mg/dL (ref 1.7–2.4)

## 2022-06-02 MED ORDER — SODIUM CHLORIDE 0.9 % IV SOLN
INTRAVENOUS | Status: DC
  Filled 2022-06-02 (×2): qty 250

## 2022-06-02 MED ORDER — OXYCODONE-ACETAMINOPHEN 10-325 MG PO TABS: 1.0000 | ORAL_TABLET | Freq: Four times a day (QID) | ORAL | 0 refills | Status: DC | PRN

## 2022-06-02 MED ORDER — SODIUM CHLORIDE 0.9% FLUSH
10.0000 mL | Freq: Once | INTRAVENOUS | Status: AC
  Administered 2022-06-02: 10 mL via INTRAVENOUS
  Filled 2022-06-02: qty 10

## 2022-06-02 MED ORDER — HEPARIN SOD (PORK) LOCK FLUSH 100 UNIT/ML IV SOLN
500.0000 [IU] | Freq: Once | INTRAVENOUS | Status: AC
  Administered 2022-06-02: 500 [IU]
  Filled 2022-06-02: qty 5

## 2022-06-02 MED ORDER — ONDANSETRON HCL 4 MG/2ML IJ SOLN: 8.0000 mg | Freq: Once | INTRAMUSCULAR | Status: DC

## 2022-06-02 NOTE — Progress Notes (Signed)
Symptom Management and [place] at [facility] [phone] [phone]  Patient Care Team: Tracie Harrier, MD as PCP - General (Internal Medicine) Telford Nab, RN as Oncology Nurse Navigator Cammie Sickle, MD as Consulting Physician (Oncology)   NAME OF PATIENT: Jose Morse  [id number]  03-30-1971   DATE OF VISIT: 06/02/22  REASON FOR CONSULT: Jose Morse is a 51 y.o. 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  Has history of perianal fistula.  Patient is on treatment with carbo/etoposide/atezo.    INTERVAL HISTORY: Patient last received Tecentriq on 05/11/2022  He presents to clinic today for evaluation of worsening fatigue over the past week.  He has had some generalized arthralgias in addition to developing a low-grade fever (Tmax of 100.6) last night.  He reports several days of dry cough but denies rhinorrhea/sore throat.  No worsening shortness of breath.  Denies any neurologic complaints.  Denies any easy bleeding or bruising. Reports fair appetite and denies weight loss. Denies chest pain. Denies any nausea, vomiting, constipation, or diarrhea. Denies urinary complaints. Patient offers no further specific complaints today.  SOCIAL HISTORY:     reports that he has been smoking cigarettes. He has a 17.00 pack-year smoking history. He has never used smokeless tobacco. He reports that he does not currently use drugs after having used the following drugs: Marijuana. He reports that he does not drink alcohol.  Patient is married and lives at home with his wife.  He has a son at home and a daughter nearby.  Patient works in Architect.  ADVANCE DIRECTIVES:  Not on file  CODE STATUS:    PAST MEDICAL HISTORY: Past Medical History:  Diagnosis Date   History of kidney stones    Hypercholesteremia     PAST SURGICAL  HISTORY:  Past Surgical History:  Procedure Laterality Date   CHOLECYSTECTOMY     IR IMAGING GUIDED PORT INSERTION  02/23/2022   VIDEO BRONCHOSCOPY WITH ENDOBRONCHIAL NAVIGATION N/A 02/10/2022   Procedure: VIDEO BRONCHOSCOPY WITH ENDOBRONCHIAL NAVIGATION;  Surgeon: Ottie Glazier, MD;  Location: [facility] ORS;  Service: Thoracic;  Laterality: N/A;   VIDEO BRONCHOSCOPY WITH ENDOBRONCHIAL ULTRASOUND N/A 02/10/2022   Procedure: VIDEO BRONCHOSCOPY WITH ENDOBRONCHIAL ULTRASOUND;  Surgeon: Ottie Glazier, MD;  Location: [facility] ORS;  Service: Thoracic;  Laterality: N/A;    HEMATOLOGY/ONCOLOGY HISTORY:  Oncology History Overview Note  # . LUNG, LEFT UPPER LOBE; ENB-GUIDED TRANSBRONCHIAL FORCEPS BIOPSY: - SMALL CELL CARCINOMA.  [Dr.Aleskerov]  Comment:  Invasive carcinoma is present in two fragments, with crush artifact. The  cells are small, with scant cytoplasm, nuclear molding, and  inconspicuous nucleoli. There is necrosis.   Immunohistochemistry (IHC) was performed for further characterization.  The neoplastic cells are positive for CD56 with strong staining of over  90% of cells. They are negative for TTF-1 and p40.    IMPRESSION: 1. LEFT suprahilar mass constricting LEFT upper lobe bronchus consistent with primary bronchogenic carcinoma. 2. Ipsilateral mediastinal nodal metastasis. 3. Distant visceral metastasis to the LIVER and bilateral ADRENAL GLANDS. 4. Distant nodal metastasis to the LEFT external iliac node. 5. Multifocal hypermetabolic skeletal metastasis (approximately 40 lesions). Lesions are occult by CT imaging. 6. Focal activity beneath the skin posterior to the RIGHT ear. Differential includes nodal metastasis versus primary parotid neoplasm. Favor nodal metastasis. (Recommend attention on brain MRI workup).     Electronically Signed   By: Helane Gunther.D.  On: 02/06/2022 13:00  # April 2023-brain MRI incidental/screening 4 mm x 2 cerebellar lesions.  # MAY 1st,  2023- carboo-Etop [d-1-3]; udenyca   Primary cancer of left upper lobe of lung ([place])  02/16/2022 Initial Diagnosis   Primary cancer of left upper lobe of lung ([place])   02/16/2022 Cancer Staging   Staging form: Lung, AJCC 8th Edition - Clinical: Stage IVB (cT3, cN2, pM1c) - Signed by Cammie Sickle, MD on 02/16/2022 Stage prefix: Initial diagnosis   02/20/2022 -  Chemotherapy   Patient is on Treatment Plan : LUNG SCLC Carboplatin + Etoposide + Atezolizumab Induction q21d / Atezolizumab Maintenance q21d       ALLERGIES:  is allergic to codeine.  MEDICATIONS:  Current Outpatient Medications  Medication Sig Dispense Refill   albuterol (PROVENTIL HFA) 108 (90 Base) MCG/ACT inhaler Inhale 2 puffs into the lungs every 4 (four) hours as needed for wheezing or shortness of breath. 1 each 0   Budeson-Glycopyrrol-Formoterol (BREZTRI AEROSPHERE) 160-9-4.8 MCG/ACT AERO Inhale 2 puffs into the lungs in the morning and at bedtime.     lidocaine-prilocaine (EMLA) cream Apply on the port. 30 -45 min  prior to port access. 30 g 3   ondansetron (ZOFRAN) 8 MG tablet One pill every 8 hours as needed for nausea/vomitting. 40 tablet 1   oxyCODONE-acetaminophen (PERCOCET) 10-325 MG tablet Take 1 tablet by mouth every 8 (eight) hours as needed for pain. 60 tablet 0   potassium chloride SA (KLOR-CON M) 20 MEQ tablet 1 pill twice a day (Patient taking differently: 20 mEq daily. 1 pill twice a day) 60 tablet 3   prochlorperazine (COMPAZINE) 10 MG tablet Take 1 tablet (10 mg total) by mouth every 6 (six) hours as needed for nausea or vomiting. 40 tablet 1   rosuvastatin (CRESTOR) 5 MG tablet Take 5 mg by mouth at bedtime.     traMADol (ULTRAM) 50 MG tablet Take 1 tablet (50 mg total) by mouth every 6 (six) hours as needed. 60 tablet 0   No current facility-administered medications for this visit.    VITAL SIGNS: There were no vitals taken for this visit. There were no vitals filed for this visit.  Estimated  body mass index is 24.12 kg/m as calculated from the following:   Height as of 05/23/22: 5\' 7"  (1.702 m).   Weight as of 05/23/22: 154 lb (69.9 kg).  LABS: CBC:    Component Value Date/Time   WBC 7.1 05/23/2022 0933   HGB 9.8 (L) 05/23/2022 0933   HCT 30.3 (L) 05/23/2022 0933   PLT 277 05/23/2022 0933   MCV 101.7 (H) 05/23/2022 0933   NEUTROABS 4.1 05/23/2022 0933   LYMPHSABS 1.4 05/23/2022 0933   MONOABS 1.4 (H) 05/23/2022 0933   EOSABS 0.0 05/23/2022 0933   BASOSABS 0.1 05/23/2022 0933   Comprehensive Metabolic Panel:    Component Value Date/Time   NA 138 05/23/2022 0933   K 3.4 (L) 05/23/2022 0933   CL 105 05/23/2022 0933   CO2 23 05/23/2022 0933   BUN 13 05/23/2022 0933   CREATININE 0.88 05/23/2022 0933   GLUCOSE 125 (H) 05/23/2022 0933   CALCIUM 8.8 (L) 05/23/2022 0933   AST 43 (H) 05/23/2022 0933   ALT 51 (H) 05/23/2022 0933   ALKPHOS 85 05/23/2022 0933   BILITOT 0.9 05/23/2022 0933   PROT 7.8 05/23/2022 0933   ALBUMIN 4.4 05/23/2022 0933    RADIOGRAPHIC STUDIES: No results found.  PERFORMANCE STATUS (ECOG) : 1 - Symptomatic but completely  ambulatory  Review of Systems Unless otherwise noted, a complete review of systems is negative.  Physical Exam General: NAD Cardiovascular: regular rate and rhythm Pulmonary: clear anterior/posterior fields Abdomen: soft, nontender, + bowel sounds GU: no suprapubic tenderness Extremities: no edema, no joint deformities Skin: no rashes Neurological: Weakness but otherwise nonfocal  IMPRESSION: Labs and exam are benign.  Will provide supportive care with IV fluids today.  Plan to send for COVID testing.  Patient and wife will let us know if COVID test is positive and will initiate treatment at that time.  Discussed that many of his symptoms could potentially be secondary to cancer/treatment.  Will plan to liberalize his Percocet to every 6 hours dosing for improved comfort.  Would recommend switching to oxycodone IR  at time of next refill given history of transaminitis.    PLAN: -Referral for COVID testing -IVFs today -Increase Percocet to Q6H PRN -Daily bowel regimen -Recommend palliative care/ongoing supportive care -RTC on Monday as previously scheduled  Case and plan discussed with Dr. Rogue Bussing   Patient expressed understanding and was in agreement with this plan. He also understands that He can call clinic at any time with any questions, concerns, or complaints.   Thank you for allowing me to participate in the care of this very pleasant patient.   Time Total: 15 minutes  Visit consisted of counseling and education dealing with the complex and emotionally intense issues of symptom management in the setting of serious illness.Greater than 50%  of this time was spent counseling and coordinating care related to the above assessment and plan.  Signed by: Altha Harm, PhD, NP-C

## 2022-06-02 NOTE — Telephone Encounter (Signed)
Attempted to reach patient and his wife. No answer left vm.

## 2022-06-02 NOTE — Telephone Encounter (Signed)
Patient wife called requesting a return call from nurse stating that she has questions about patient appointment this morning

## 2022-06-02 NOTE — Telephone Encounter (Signed)
Spoke with patient's wife. Patient is having a lot of joint pain- generalized body aches in wrist, shoulder and hips in addition to N&V. Pt accepted apt at 930 am for [facility].

## 2022-06-02 NOTE — Telephone Encounter (Signed)
Had a long conversation with patient's wife.  They have been married 23 years, although they met and became friends in middle school.   She had multiple questions regarding prognosis and wanted to discuss what to expect as he nears end-of-life.  She says that patient is not accepting of the terminal nature of his cancer but that she and their daughter recognize his prognosis is poor.    Last scans showed treatment response.  I explained that future scans would help guide [place].  She explained that their immediate goal is to attend their daughter's wedding on September 2 and have patient able to walk her down the aisle.  We discussed utilization of supportive services in the cancer center if needed.  All questions answered to her verbalized satisfaction.

## 2022-06-03 ENCOUNTER — Other Ambulatory Visit: Payer: Self-pay

## 2022-06-05 ENCOUNTER — Inpatient Hospital Stay: Payer: BC Managed Care – PPO

## 2022-06-05 ENCOUNTER — Encounter: Payer: Self-pay | Admitting: Internal Medicine

## 2022-06-05 ENCOUNTER — Inpatient Hospital Stay (HOSPITAL_BASED_OUTPATIENT_CLINIC_OR_DEPARTMENT_OTHER): Payer: BC Managed Care – PPO | Admitting: Internal Medicine

## 2022-06-05 DIAGNOSIS — C3412 Malignant neoplasm of upper lobe, left bronchus or lung: Secondary | ICD-10-CM

## 2022-06-05 LAB — CBC WITH DIFFERENTIAL/PLATELET
Abs Immature Granulocytes: 0.02 10*3/uL (ref 0.00–0.07)
Basophils Absolute: 0.1 10*3/uL (ref 0.0–0.1)
Basophils Relative: 1 %
Eosinophils Absolute: 0.3 10*3/uL (ref 0.0–0.5)
Eosinophils Relative: 4 %
HCT: 38 % — ABNORMAL LOW (ref 39.0–52.0)
Hemoglobin: 12.1 g/dL — ABNORMAL LOW (ref 13.0–17.0)
Immature Granulocytes: 0 %
Lymphocytes Relative: 16 %
Lymphs Abs: 1.1 10*3/uL (ref 0.7–4.0)
MCH: 31.8 pg (ref 26.0–34.0)
MCHC: 31.8 g/dL (ref 30.0–36.0)
MCV: 99.7 fL (ref 80.0–100.0)
Monocytes Absolute: 0.9 10*3/uL (ref 0.1–1.0)
Monocytes Relative: 14 %
Neutro Abs: 4.3 10*3/uL (ref 1.7–7.7)
Neutrophils Relative %: 65 %
Platelets: 228 10*3/uL (ref 150–400)
RBC: 3.81 MIL/uL — ABNORMAL LOW (ref 4.22–5.81)
RDW: 14.8 % (ref 11.5–15.5)
WBC: 6.6 10*3/uL (ref 4.0–10.5)
nRBC: 0 % (ref 0.0–0.2)

## 2022-06-05 LAB — COMPREHENSIVE METABOLIC PANEL
ALT: 44 U/L (ref 0–44)
AST: 53 U/L — ABNORMAL HIGH (ref 15–41)
Albumin: 4 g/dL (ref 3.5–5.0)
Alkaline Phosphatase: 102 U/L (ref 38–126)
Anion gap: 10 (ref 5–15)
BUN: 13 mg/dL (ref 6–20)
CO2: 25 mmol/L (ref 22–32)
Calcium: 9.4 mg/dL (ref 8.9–10.3)
Chloride: 102 mmol/L (ref 98–111)
Creatinine, Ser: 0.9 mg/dL (ref 0.61–1.24)
GFR, Estimated: >60 mL/min (ref >60)
Glucose, Bld: 141 mg/dL — ABNORMAL HIGH (ref 70–99)
Potassium: 3.2 mmol/L — ABNORMAL LOW (ref 3.5–5.1)
Sodium: 137 mmol/L (ref 135–145)
Total Bilirubin: 0.8 mg/dL (ref 0.3–1.2)
Total Protein: 7.6 g/dL (ref 6.5–8.1)

## 2022-06-05 LAB — TSH: TSH: 0.956 u[IU]/mL (ref 0.350–4.500)

## 2022-06-05 MED ORDER — SODIUM CHLORIDE 0.9 % IV SOLN
Freq: Once | INTRAVENOUS | Status: AC
  Filled 2022-06-05: qty 250

## 2022-06-05 MED ORDER — SODIUM CHLORIDE 0.9% FLUSH
10.0000 mL | Freq: Once | INTRAVENOUS | Status: AC
  Administered 2022-06-05: 10 mL via INTRAVENOUS
  Filled 2022-06-05: qty 10

## 2022-06-05 MED ORDER — SODIUM CHLORIDE 0.9 % IV SOLN: 4.0000 mg | Freq: Once | INTRAVENOUS | Status: DC

## 2022-06-05 MED ORDER — DEXAMETHASONE SODIUM PHOSPHATE 10 MG/ML IJ SOLN
INTRAMUSCULAR | Status: AC
  Filled 2022-06-05: qty 1

## 2022-06-05 MED ORDER — DEXAMETHASONE SODIUM PHOSPHATE 10 MG/ML IJ SOLN
4.0000 mg | Freq: Once | INTRAMUSCULAR | Status: AC
  Administered 2022-06-05: 4 mg via INTRAVENOUS

## 2022-06-05 MED ORDER — HEPARIN SOD (PORK) LOCK FLUSH 100 UNIT/ML IV SOLN
INTRAVENOUS | Status: AC
  Filled 2022-06-05: qty 5

## 2022-06-05 MED ORDER — PREDNISONE 20 MG PO TABS: 20.0000 mg | ORAL_TABLET | Freq: Every day | ORAL | 0 refills | Status: DC

## 2022-06-05 MED ORDER — HEPARIN SOD (PORK) LOCK FLUSH 100 UNIT/ML IV SOLN
500.0000 [IU] | Freq: Once | INTRAVENOUS | Status: AC
  Administered 2022-06-05: 500 [IU] via INTRAVENOUS
  Filled 2022-06-05: qty 5

## 2022-06-05 NOTE — Progress Notes (Signed)
C/o headaches after last treatment.

## 2022-06-05 NOTE — Assessment & Plan Note (Addendum)
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 April 2023-brain MRI-4 mm x 2 metastases noted.  Currently on [place] etoposide-Tecentriq every 3 weeks x 4 cycles; followed by maintenance Tecentriq post chemotherapy. JUNE 26th, 2023- IMPRESSION: Significant response to therapy [both in lung and liver; no new disease]; 4 mm right lower lobe nodule-monitor.  See incidental findings.  # HOLD  cycle #4-  of carboplatin etoposide-Tecentriq; labs today reviewed-worsening joint pains see below;  JUNE 2023- TSH- WNL.  Clinically stable.  # worsening joint pains [knees, wrist, shoulder pain]- ? From Atezo- Immune sideffects.  Clinically less likely from underlying progression of disease.-start prednisone.  Take 3 pills once a day for 1 week; 2 pills once a day for 1 week; and then 1 pill a day.  Take it in the morning with food.  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 s/p Zometa-4 mg IV every  6 weeks- Stable; see above.   #Hypokalemia potassium 3.7 secondary chemotherapy--STABLE-continue K-Dur one a day.  # #Brain metastases : April 2023-MRI brain 4 mm x 2 [asymptomatic/screening MRI]-NO WBRT.  Status post chemo-chemotherapy.  JUNE 2023-resolution of brain metastases-STABLE.   # Smoking: Active smoker;  the process of quitting. STABLE.   # COPD: Continue inhalers [Dr.Aleskerov]- STABLE.   # DISPOSITION:  # ONLY Dexamethasone today.  NO Tenctriq/ Zometa today;  # follow up in 3 weeks with MD; labs-cbc/cmp-Tecentriq; Zometa-  Dr.B.

## 2022-06-05 NOTE — Progress Notes (Signed)
[facility] CONSULT NOTE  Patient Care Team: Tracie Harrier, MD as PCP - General (Internal Medicine) Telford Nab, RN as Oncology Nurse Navigator Cammie Sickle, MD as Consulting Physician (Oncology)  CHIEF COMPLAINTS/PURPOSE OF CONSULTATION: lung cancer  #  Oncology History Overview Note  # . LUNG, LEFT UPPER LOBE; ENB-GUIDED TRANSBRONCHIAL FORCEPS BIOPSY: - SMALL CELL CARCINOMA.  [Dr.Aleskerov]  Comment:  Invasive carcinoma is present in two fragments, with crush artifact. The  cells are small, with scant cytoplasm, nuclear molding, and  inconspicuous nucleoli. There is necrosis.   Immunohistochemistry (IHC) was performed for further characterization.  The neoplastic cells are positive for CD56 with strong staining of over  90% of cells. They are negative for TTF-1 and p40.    IMPRESSION: 1. LEFT suprahilar mass constricting LEFT upper lobe bronchus consistent with primary bronchogenic carcinoma. 2. Ipsilateral mediastinal nodal metastasis. 3. Distant visceral metastasis to the LIVER and bilateral ADRENAL GLANDS. 4. Distant nodal metastasis to the LEFT external iliac node. 5. Multifocal hypermetabolic skeletal metastasis (approximately 40 lesions). Lesions are occult by CT imaging. 6. Focal activity beneath the skin posterior to the RIGHT ear. Differential includes nodal metastasis versus primary parotid neoplasm. Favor nodal metastasis. (Recommend attention on brain MRI workup).     Electronically Signed   By: Suzy Bouchard M.D.   On: 02/06/2022 13:00  # April 2023-brain MRI incidental/screening 4 mm x 2 cerebellar lesions.  # MAY 1st, 2023- carboo-Etop [d-1-3]; udenyca   Primary cancer of left upper lobe of lung ([place])  02/16/2022 Initial Diagnosis   Primary cancer of left upper lobe of lung ([place])   02/16/2022 Cancer Staging   Staging form: Lung, AJCC 8th Edition - Clinical: Stage IVB (cT3, cN2, pM1c) - Signed by Cammie Sickle,  MD on 02/16/2022 Stage prefix: Initial diagnosis   02/20/2022 - 05/15/2022 Chemotherapy   Patient is on Treatment Plan : LUNG SCLC Carboplatin + Etoposide + Atezolizumab Induction q21d / Atezolizumab Maintenance q21d     02/20/2022 -  Chemotherapy   Patient is on Treatment Plan : LUNG SCLC Carboplatin + Etoposide + Atezolizumab Induction q21d x 4 cycles / Atezolizumab Maintenance q21d      HISTORY OF PRESENTING ILLNESS: Ambulating independently.  Alone.   Jose Morse 51 y.o.  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   In the interim patient evaluated in the [facility] regarding worsening "pains".  Patient's dose of oxycodone was also recently increased.  Denies any worsening shortness of breath or cough.  Denies hemoptysis.  Complains of mild headach this morning.  Appetite is good.  Blood sugar weight loss.  Review of Systems  Constitutional:  Positive for malaise/fatigue. Negative for chills, diaphoresis, fever and weight loss.  HENT:  Negative for nosebleeds and sore throat.   Eyes:  Negative for double vision.  Respiratory:  Positive for wheezing. Negative for cough, hemoptysis, sputum production and shortness of breath.   Cardiovascular:  Positive for chest pain. Negative for palpitations, orthopnea and leg swelling.  Gastrointestinal:  Negative for abdominal pain, blood in stool, constipation, diarrhea, heartburn, melena, nausea and vomiting.  Genitourinary:  Negative for dysuria, frequency and urgency.  Musculoskeletal:  Positive for back pain and joint pain.  Skin: Negative.  Negative for itching and rash.  Neurological:  Negative for dizziness, tingling, focal weakness, weakness and headaches.  Endo/Heme/Allergies:  Does not bruise/bleed easily.  Psychiatric/Behavioral:  Negative for depression. The patient is not nervous/anxious  and does not have insomnia.      MEDICAL HISTORY:  Past Medical  History:  Diagnosis Date   History of kidney stones    Hypercholesteremia     SURGICAL HISTORY: Past Surgical History:  Procedure Laterality Date   CHOLECYSTECTOMY     IR IMAGING GUIDED PORT INSERTION  02/23/2022   VIDEO BRONCHOSCOPY WITH ENDOBRONCHIAL NAVIGATION N/A 02/10/2022   Procedure: VIDEO BRONCHOSCOPY WITH ENDOBRONCHIAL NAVIGATION;  Surgeon: Ottie Glazier, MD;  Location: [facility];  Service: Thoracic;  Laterality: N/A;   VIDEO BRONCHOSCOPY WITH ENDOBRONCHIAL ULTRASOUND N/A 02/10/2022   Procedure: VIDEO BRONCHOSCOPY WITH ENDOBRONCHIAL ULTRASOUND;  Surgeon: Ottie Glazier, MD;  Location: [facility];  Service: Thoracic;  Laterality: N/A;    SOCIAL HISTORY: Social History   Socioeconomic History   Marital status: Married    Spouse name: Not on file   Number of children: Not on file   Years of education: Not on file   Highest education level: Not on file  Occupational History   Not on file  Tobacco Use   Smoking status: Every Day    Packs/day: 0.50    Years: 34.00    Total pack years: 17.00    Types: Cigarettes   Smokeless tobacco: Never  Vaping Use   Vaping Use: Never used  Substance and Sexual Activity   Alcohol use: No   Drug use: Not Currently    Types: Marijuana   Sexual activity: Yes  Other Topics Concern   Not on file  Social History Narrative   Lives in [place]; with wife/son- 71 y [2023]; works in [employer]. Smoker; no alcohol.    Social Determinants of Health   Financial Resource Strain: Not on file  Food Insecurity: Not on file  Transportation Needs: Not on file  Physical Activity: Not on file  Stress: Not on file  Social Connections: Not on file  Intimate Partner Violence: Not on file    FAMILY HISTORY: Family History  Problem Relation Age of Onset   Ovarian cancer Mother    Colon cancer Father    Diabetes Father     ALLERGIES:  is allergic to codeine.  MEDICATIONS:  Current Outpatient Medications  Medication Sig Dispense Refill    albuterol (PROVENTIL HFA) 108 (90 Base) MCG/ACT inhaler Inhale 2 puffs into the lungs every 4 (four) hours as needed for wheezing or shortness of breath. 1 each 0   Budeson-Glycopyrrol-Formoterol (BREZTRI AEROSPHERE) 160-9-4.8 MCG/ACT AERO Inhale 2 puffs into the lungs in the morning and at bedtime.     lidocaine-prilocaine (EMLA) cream Apply on the port. 30 -45 min  prior to port access. 30 g 3   ondansetron (ZOFRAN) 8 MG tablet One pill every 8 hours as needed for nausea/vomitting. 40 tablet 1   oxyCODONE-acetaminophen (PERCOCET) 10-325 MG tablet Take 1 tablet by mouth every 6 (six) hours as needed for pain. 60 tablet 0   potassium chloride SA (KLOR-CON M) 20 MEQ tablet 1 pill twice a day (Patient taking differently: 20 mEq daily. 1 pill twice a day) 60 tablet 3   predniSONE (DELTASONE) 20 MG tablet Take 1 tablet (20 mg total) by mouth daily with breakfast. Take 3 pills once a day for 1 week; 2 pills once a day for 1 week; and then 1 pill a day.  Take it in the morning with food. 45 tablet 0   prochlorperazine (COMPAZINE) 10 MG tablet Take 1 tablet (10 mg total) by mouth every 6 (six) hours as needed for nausea or vomiting.  40 tablet 1   rosuvastatin (CRESTOR) 5 MG tablet Take 5 mg by mouth at bedtime.     traMADol (ULTRAM) 50 MG tablet Take 1 tablet (50 mg total) by mouth every 6 (six) hours as needed. 60 tablet 0   No current facility-administered medications for this visit.   Facility-Administered Medications Ordered in Other Visits  Medication Dose Route Frequency Provider Last Rate Last Admin   dexamethasone (DECADRON) 10 MG/ML injection            heparin lock flush 100 UNIT/ML injection               Left chest wall skin rash anterior-maculopapular skin rash.;  Not in dermatomal distribution.  PHYSICAL EXAMINATION: ECOG PERFORMANCE STATUS: 1 - Symptomatic but completely ambulatory  Vitals:   06/05/22 0828  BP: (!) 136/94  Pulse: 91  Temp: (!) 96.5 F (35.8 C)  SpO2: 99%    Filed Weights   06/05/22 0828  Weight: 149 lb 3.2 oz (67.7 kg)    Physical Exam Vitals and nursing note reviewed.  HENT:     Head: Normocephalic and atraumatic.     Mouth/Throat:     Pharynx: Oropharynx is clear.  Eyes:     Extraocular Movements: Extraocular movements intact.     Pupils: Pupils are equal, round, and reactive to light.  Cardiovascular:     Rate and Rhythm: Normal rate and regular rhythm.  Pulmonary:     Comments: Decreased breath sounds bilaterally.  Abdominal:     Palpations: Abdomen is soft.  Musculoskeletal:        General: Normal range of motion.     Cervical back: Normal range of motion.  Skin:    General: Skin is warm.  Neurological:     General: No focal deficit present.     Mental Status: He is alert and oriented to person, place, and time.  Psychiatric:        Behavior: Behavior normal.        Judgment: Judgment normal.      LABORATORY DATA:  I have reviewed the data as listed Lab Results  Component Value Date   WBC 6.6 06/05/2022   HGB 12.1 (L) 06/05/2022   HCT 38.0 (L) 06/05/2022   MCV 99.7 06/05/2022   PLT 228 06/05/2022   Recent Labs    05/23/22 0933 06/02/22 1012 06/05/22 0810  NA 138 135 137  K 3.4* 4.0 3.2*  CL 105 97* 102  CO2 23 27 25   GLUCOSE 125* 110* 141*  BUN 13 14 13   CREATININE 0.88 0.92 0.90  CALCIUM 8.8* 9.4 9.4  GFRNONAA >60 >60 >60  PROT 7.8 7.8 7.6  ALBUMIN 4.4 4.0 4.0  AST 43* 35 53*  ALT 51* 50* 44  ALKPHOS 85 123 102  BILITOT 0.9 0.9 0.8    RADIOGRAPHIC STUDIES: I have personally reviewed the radiological images as listed and agreed with the findings in the report. No results found.  ASSESSMENT & PLAN:   Primary cancer of left upper lobe of lung ([place]) #Extensive stage small cell lung cancer /stage IV. April 2023-brain MRI-4 mm x 2 metastases noted.  Currently on [place] etoposide-Tecentriq every 3 weeks x 4 cycles; followed by maintenance Tecentriq post chemotherapy. JUNE 26th, 2023-  IMPRESSION: Significant response to therapy [both in lung and liver; no new disease]; 4 mm right lower lobe nodule-monitor.  See incidental findings.  # HOLD  cycle #4-  of carboplatin etoposide-Tecentriq; labs today reviewed-worsening joint pains see below;  JUNE 2023- TSH- WNL.  Clinically stable.  # worsening joint pains [knees, wrist, shoulder pain]- ? From Atezo- Immune sideffects.  Clinically less likely from underlying progression of disease.-start prednisone.  Take 3 pills once a day for 1 week; 2 pills once a day for 1 week; and then 1 pill a day.  Take it in the morning with food.  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 s/p Zometa-4 mg IV every  6 weeks- Stable; see above.   #Hypokalemia potassium 3.7 secondary chemotherapy--STABLE-continue K-Dur one a day.  # #Brain metastases : April 2023-MRI brain 4 mm x 2 [asymptomatic/screening MRI]-NO WBRT.  Status post chemo-chemotherapy.  JUNE 2023-resolution of brain metastases-STABLE.   # Smoking: Active smoker;  the process of quitting. STABLE.   # COPD: Continue inhalers [Dr.Aleskerov]- STABLE.   # DISPOSITION:  # ONLY Dexamethasone today.  NO Tenctriq/ Zometa today;  # follow up in 3 weeks with MD; labs-cbc/cmp-Tecentriq; Zometa-  Dr.B.       All questions were answered. The patient knows to call the clinic with any problems, questions or concerns.       Cammie Sickle, MD 06/05/2022 4:46 PM

## 2022-06-06 ENCOUNTER — Telehealth: Payer: Self-pay | Admitting: *Deleted

## 2022-06-06 ENCOUNTER — Other Ambulatory Visit: Payer: Self-pay | Admitting: Internal Medicine

## 2022-06-06 LAB — T4: T4, Total: 9 ug/dL (ref 4.5–12.0)

## 2022-06-06 NOTE — Telephone Encounter (Signed)
Wife called stating that the directions on the pill bottle are different from what he was told to take in the office and she would like clarification

## 2022-06-06 NOTE — Telephone Encounter (Signed)
Left message for a call back with the wife.

## 2022-06-07 ENCOUNTER — Other Ambulatory Visit: Payer: Self-pay

## 2022-06-07 ENCOUNTER — Other Ambulatory Visit: Payer: Self-pay | Admitting: Internal Medicine

## 2022-06-07 NOTE — Telephone Encounter (Signed)
I left a message on wifes vm with these instructions for the prednisone Dr. B called in: Take 1 tablet (20 mg total) by mouth daily with breakfast. Take 3 pills once a day for 1 week; 2 pills once a day for 1 week; and then 1 pill a day.  Take it in the morning with food.

## 2022-06-08 ENCOUNTER — Other Ambulatory Visit: Payer: Self-pay | Admitting: Internal Medicine

## 2022-06-08 NOTE — Telephone Encounter (Signed)
Component Ref Range & Units 3 d ago (06/05/22) 6 d ago (06/02/22) 2 wk ago (05/23/22) 3 wk ago (05/15/22) 1 mo ago (04/24/22) 2 mo ago (04/03/22) 2 mo ago (03/21/22)  Potassium 3.5 - 5.1 mmol/L 3.2 Low   4.0  3.4 Low   3.2 Low   3.7  4.0  3.8

## 2022-06-09 ENCOUNTER — Other Ambulatory Visit: Payer: Self-pay | Admitting: *Deleted

## 2022-06-09 ENCOUNTER — Telehealth: Payer: Self-pay | Admitting: *Deleted

## 2022-06-09 ENCOUNTER — Telehealth: Payer: Self-pay | Admitting: Nurse Practitioner

## 2022-06-09 DIAGNOSIS — C3490 Malignant neoplasm of unspecified part of unspecified bronchus or lung: Secondary | ICD-10-CM

## 2022-06-09 DIAGNOSIS — C3412 Malignant neoplasm of upper lobe, left bronchus or lung: Secondary | ICD-10-CM

## 2022-06-09 DIAGNOSIS — M2550 Pain in unspecified joint: Secondary | ICD-10-CM

## 2022-06-09 MED ORDER — OXYCODONE-ACETAMINOPHEN 10-325 MG PO TABS: 1.0000 | ORAL_TABLET | Freq: Four times a day (QID) | ORAL | 0 refills | Status: DC | PRN

## 2022-06-09 NOTE — Telephone Encounter (Signed)
Josh/ Dr. Jacinto Reap- please advise.

## 2022-06-09 NOTE — Telephone Encounter (Signed)
Returned Kim's call. Patient has pain in joints different than previous. Now more stiff neck, headache, poor sleep. Reviewed that prednisone may be contributing to impaired sleep but should improve as he titrates down. She is concerned for progressive disease. Patient is currently working and does not want to miss work for evaluation. Prefers virtual evaluation. I've asked to have hims cheduled with [facility] on Monday for virtual visit.

## 2022-06-09 NOTE — Telephone Encounter (Signed)
Lauren spoke to patient's wife at 15

## 2022-06-09 NOTE — Telephone Encounter (Signed)
Kim called reporting that patient joint pain is better, but his back and neck are stiff and hurting, and he has a headache. She is asking if him not getting his chemotherapy is causing this to come back. She also reports that he has not slept well the past 4 nights. Please advise

## 2022-06-09 NOTE — Telephone Encounter (Signed)
Lauren- please advise.

## 2022-06-09 NOTE — Telephone Encounter (Signed)
Per Ander Purpura, pt needs a mychart visit with josh on Monday 8/21. Pamala Hurry- please arrange. Thanks.

## 2022-06-12 ENCOUNTER — Inpatient Hospital Stay (HOSPITAL_BASED_OUTPATIENT_CLINIC_OR_DEPARTMENT_OTHER): Payer: BC Managed Care – PPO | Admitting: Hospice and Palliative Medicine

## 2022-06-12 ENCOUNTER — Telehealth: Payer: BC Managed Care – PPO | Admitting: Hospice and Palliative Medicine

## 2022-06-12 DIAGNOSIS — Z515 Encounter for palliative care: Secondary | ICD-10-CM | POA: Diagnosis not present

## 2022-06-12 DIAGNOSIS — G893 Neoplasm related pain (acute) (chronic): Secondary | ICD-10-CM | POA: Diagnosis not present

## 2022-06-12 DIAGNOSIS — C3412 Malignant neoplasm of upper lobe, left bronchus or lung: Secondary | ICD-10-CM

## 2022-06-12 NOTE — Progress Notes (Signed)
Virtual Visit via Telephone Note  I connected with [name] on 06/12/22 at  3:00 PM EDT by telephone and verified that I am speaking with the correct person using two identifiers.  Location: Patient: Work Provider: Clinic   I discussed the limitations, risks, security and privacy concerns of performing an evaluation and management service by telephone and the availability of in person appointments. I also discussed with the patient that there may be a patient responsible charge related to this service. The patient expressed understanding and agreed to proceed.   History of Present Illness: Jose Morse is a 51 y.o. 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  Has history of perianal fistula.   Patient is on treatment with carbo/etoposide/atezo.    Observations/Objective: I called and spoke with patient.  Patient reports that he is feeling much improved.  He has some ongoing fatigue but states that he has been able to return to work and is felt better.  Pain is also improved.  He currently rates it as 2 out of 10.  He has had some insomnia on the steroids that is also improved and patient is set to reduce the dose.  No acute changes reported today  Assessment and Plan: Neoplasm related pain -continue Percocet.  Can consider starting a long-acting opioid if needed.  Daily bowel regimen.  Continue prednisone wean.  Follow Up Instructions: Follow-up telephone visit 1 to 2 months   I discussed the assessment and treatment plan with the patient. The patient was provided an opportunity to ask questions and all were answered. The patient agreed with the plan and demonstrated an understanding of the instructions.   The patient was advised to call back or seek an in-person evaluation if the symptoms worsen or if the condition fails to improve as anticipated.  I provided 5 minutes of non-face-to-face time  during this encounter.   [name], NP

## 2022-06-13 ENCOUNTER — Other Ambulatory Visit: Payer: Self-pay | Admitting: Internal Medicine

## 2022-06-23 ENCOUNTER — Telehealth: Payer: Self-pay | Admitting: *Deleted

## 2022-06-23 ENCOUNTER — Inpatient Hospital Stay: Payer: BC Managed Care – PPO | Attending: Internal Medicine

## 2022-06-23 ENCOUNTER — Other Ambulatory Visit: Payer: Self-pay

## 2022-06-23 ENCOUNTER — Inpatient Hospital Stay: Payer: BC Managed Care – PPO

## 2022-06-23 ENCOUNTER — Inpatient Hospital Stay (HOSPITAL_BASED_OUTPATIENT_CLINIC_OR_DEPARTMENT_OTHER): Payer: BC Managed Care – PPO | Admitting: Hospice and Palliative Medicine

## 2022-06-23 VITALS — BP 135/93 | HR 82 | Temp 96.2°F | Resp 16

## 2022-06-23 DIAGNOSIS — C3412 Malignant neoplasm of upper lobe, left bronchus or lung: Secondary | ICD-10-CM

## 2022-06-23 DIAGNOSIS — R112 Nausea with vomiting, unspecified: Secondary | ICD-10-CM | POA: Insufficient documentation

## 2022-06-23 DIAGNOSIS — J449 Chronic obstructive pulmonary disease, unspecified: Secondary | ICD-10-CM | POA: Insufficient documentation

## 2022-06-23 DIAGNOSIS — Z9221 Personal history of antineoplastic chemotherapy: Secondary | ICD-10-CM | POA: Insufficient documentation

## 2022-06-23 DIAGNOSIS — Z95828 Presence of other vascular implants and grafts: Secondary | ICD-10-CM | POA: Diagnosis not present

## 2022-06-23 DIAGNOSIS — C7931 Secondary malignant neoplasm of brain: Secondary | ICD-10-CM | POA: Insufficient documentation

## 2022-06-23 DIAGNOSIS — G893 Neoplasm related pain (acute) (chronic): Secondary | ICD-10-CM | POA: Diagnosis not present

## 2022-06-23 DIAGNOSIS — F17210 Nicotine dependence, cigarettes, uncomplicated: Secondary | ICD-10-CM | POA: Diagnosis not present

## 2022-06-23 DIAGNOSIS — R030 Elevated blood-pressure reading, without diagnosis of hypertension: Secondary | ICD-10-CM | POA: Diagnosis not present

## 2022-06-23 DIAGNOSIS — Z79899 Other long term (current) drug therapy: Secondary | ICD-10-CM | POA: Diagnosis not present

## 2022-06-23 DIAGNOSIS — C7951 Secondary malignant neoplasm of bone: Secondary | ICD-10-CM | POA: Diagnosis not present

## 2022-06-23 DIAGNOSIS — E876 Hypokalemia: Secondary | ICD-10-CM | POA: Insufficient documentation

## 2022-06-23 DIAGNOSIS — Z7952 Long term (current) use of systemic steroids: Secondary | ICD-10-CM | POA: Insufficient documentation

## 2022-06-23 DIAGNOSIS — Z5112 Encounter for antineoplastic immunotherapy: Secondary | ICD-10-CM | POA: Insufficient documentation

## 2022-06-23 DIAGNOSIS — R066 Hiccough: Secondary | ICD-10-CM | POA: Diagnosis not present

## 2022-06-23 LAB — COMPREHENSIVE METABOLIC PANEL
ALT: 64 U/L — ABNORMAL HIGH (ref 0–44)
AST: 23 U/L (ref 15–41)
Albumin: 4.4 g/dL (ref 3.5–5.0)
Alkaline Phosphatase: 69 U/L (ref 38–126)
Anion gap: 13 (ref 5–15)
BUN: 31 mg/dL — ABNORMAL HIGH (ref 6–20)
CO2: 24 mmol/L (ref 22–32)
Calcium: 9.5 mg/dL (ref 8.9–10.3)
Chloride: 102 mmol/L (ref 98–111)
Creatinine, Ser: 0.86 mg/dL (ref 0.61–1.24)
GFR, Estimated: >60 mL/min (ref >60)
Glucose, Bld: 91 mg/dL (ref 70–99)
Potassium: 3.9 mmol/L (ref 3.5–5.1)
Sodium: 139 mmol/L (ref 135–145)
Total Bilirubin: 1.4 mg/dL — ABNORMAL HIGH (ref 0.3–1.2)
Total Protein: 7.9 g/dL (ref 6.5–8.1)

## 2022-06-23 LAB — CBC WITH DIFFERENTIAL/PLATELET
Abs Immature Granulocytes: 0.04 10*3/uL (ref 0.00–0.07)
Basophils Absolute: 0 10*3/uL (ref 0.0–0.1)
Basophils Relative: 0 %
Eosinophils Absolute: 0.1 10*3/uL (ref 0.0–0.5)
Eosinophils Relative: 1 %
HCT: 47.6 % (ref 39.0–52.0)
Hemoglobin: 15.1 g/dL (ref 13.0–17.0)
Immature Granulocytes: 0 %
Lymphocytes Relative: 15 %
Lymphs Abs: 2.3 10*3/uL (ref 0.7–4.0)
MCH: 30.8 pg (ref 26.0–34.0)
MCHC: 31.7 g/dL (ref 30.0–36.0)
MCV: 97.1 fL (ref 80.0–100.0)
Monocytes Absolute: 1.2 10*3/uL — ABNORMAL HIGH (ref 0.1–1.0)
Monocytes Relative: 8 %
Neutro Abs: 11.6 10*3/uL — ABNORMAL HIGH (ref 1.7–7.7)
Neutrophils Relative %: 76 %
Platelets: 170 10*3/uL (ref 150–400)
RBC: 4.9 MIL/uL (ref 4.22–5.81)
RDW: 13.7 % (ref 11.5–15.5)
WBC: 15.3 10*3/uL — ABNORMAL HIGH (ref 4.0–10.5)
nRBC: 0 % (ref 0.0–0.2)

## 2022-06-23 LAB — MAGNESIUM: Magnesium: 2.1 mg/dL (ref 1.7–2.4)

## 2022-06-23 MED ORDER — SODIUM CHLORIDE 0.9% FLUSH
10.0000 mL | Freq: Once | INTRAVENOUS | Status: AC
  Administered 2022-06-23: 10 mL via INTRAVENOUS
  Filled 2022-06-23: qty 10

## 2022-06-23 MED ORDER — OXYCODONE-ACETAMINOPHEN 10-325 MG PO TABS: 1.0000 | ORAL_TABLET | Freq: Four times a day (QID) | ORAL | 0 refills | Status: AC | PRN

## 2022-06-23 MED ORDER — OLANZAPINE 10 MG PO TABS: 5.0000 mg | ORAL_TABLET | Freq: Every evening | ORAL | 0 refills | Status: DC | PRN

## 2022-06-23 MED ORDER — HEPARIN SOD (PORK) LOCK FLUSH 100 UNIT/ML IV SOLN
500.0000 [IU] | Freq: Once | INTRAVENOUS | Status: AC
  Administered 2022-06-23: 500 [IU] via INTRAVENOUS
  Filled 2022-06-23: qty 5

## 2022-06-23 MED ORDER — SODIUM CHLORIDE 0.9 % IV SOLN
INTRAVENOUS | Status: DC
  Filled 2022-06-23 (×2): qty 250

## 2022-06-23 MED ORDER — ONDANSETRON 8 MG PO TBDP
8.0000 mg | ORAL_TABLET | Freq: Three times a day (TID) | ORAL | 0 refills | Status: DC | PRN
Start: 2022-06-23 — End: 2022-07-01

## 2022-06-23 MED ORDER — SODIUM CHLORIDE 0.9 % IV SOLN
10.0000 mg | Freq: Once | INTRAVENOUS | Status: AC
  Administered 2022-06-23: 10 mg via INTRAVENOUS
  Filled 2022-06-23: qty 10

## 2022-06-23 MED ORDER — ONDANSETRON HCL 4 MG/2ML IJ SOLN
8.0000 mg | Freq: Once | INTRAMUSCULAR | Status: AC
  Administered 2022-06-23: 8 mg via INTRAVENOUS
  Filled 2022-06-23: qty 4

## 2022-06-23 NOTE — Addendum Note (Signed)
Addended by: Altha Harm R on: 06/23/2022 05:01 PM   Modules accepted: Orders

## 2022-06-23 NOTE — Telephone Encounter (Signed)
Hassan Rowan- Please let wife know that Merrily Pew will call her later this evening.

## 2022-06-23 NOTE — Telephone Encounter (Signed)
She is not happy about not being called back she states that he is vomiting up his antiemetics and she thinks he needs to come in to be seen TODAY He is weak and only able to get out of bed to go to bathroom

## 2022-06-23 NOTE — Progress Notes (Addendum)
Symptom Management [place] at [facility] [phone] [phone]) [phone]  Patient Care Team: Tracie Harrier, MD as PCP - General (Internal Medicine) Telford Nab, RN as Oncology Nurse Navigator Cammie Sickle, MD as Consulting Physician (Oncology)   NAME OF PATIENT: Jose Morse  [id number]  Mar 24, 1971   DATE OF VISIT: 06/23/22  REASON FOR CONSULT: TORRIE LAFAVOR is a 51 y.o. 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  Patient has history of perianal fistula.   Patient received 4 cycles of carbo/etoposide.  He is now on maintenance Tecentriq.  He last received treatment on 05/15/2022 with cycle 5 Tecentriq.   INTERVAL HISTORY: Wife called today requesting urgent evaluation stating that patient has been progressively symptomatic over the past week with nausea and vomiting.  Reportedly, patient is supposed to attend his daughter's wedding tomorrow.  Patient added on to schedule this afternoon.  He walked in the clinic.  He reports that he has had nausea with about 4 episodes of vomiting over the past 24 hours.  Vomiting is nonbloody nonbilious.  Patient's had reduced oral intake.  No fever or chills.  No other symptomatic complaints.  Denies any neurologic complaints. Denies recent fevers or illnesses. Denies any easy bleeding or bruising. Reports fair appetite and denies weight loss. Denies chest pain. Denies urinary complaints. Patient offers no further specific complaints today.   PAST MEDICAL HISTORY: Past Medical History:  Diagnosis Date   History of kidney stones    Hypercholesteremia     PAST SURGICAL HISTORY:  Past Surgical History:  Procedure Laterality Date   CHOLECYSTECTOMY     IR IMAGING GUIDED PORT INSERTION  02/23/2022   VIDEO BRONCHOSCOPY WITH ENDOBRONCHIAL NAVIGATION N/A 02/10/2022   Procedure: VIDEO BRONCHOSCOPY WITH ENDOBRONCHIAL  NAVIGATION;  Surgeon: Ottie Glazier, MD;  Location: [facility] ORS;  Service: Thoracic;  Laterality: N/A;   VIDEO BRONCHOSCOPY WITH ENDOBRONCHIAL ULTRASOUND N/A 02/10/2022   Procedure: VIDEO BRONCHOSCOPY WITH ENDOBRONCHIAL ULTRASOUND;  Surgeon: Ottie Glazier, MD;  Location: [facility] OR[facility];  Service: Thoracic;  Laterality: N/A;    HEMATOLOGY/ONCOLOGY HISTORY:  Oncology History Overview Note  # . LUNG, LEFT UPPER LOBE; ENB-GUIDED TRANSBRONCHIAL FORCEPS BIOPSY: - SMALL CELL CARCINOMA.  [Dr.Aleskerov]  Comment:  Invasive carcinoma is present in two fragments, with crush artifact. The  cells are small, with scant cytoplasm, nuclear molding, and  inconspicuous nucleoli. There is necrosis.   Immunohistochemistry (IHC) was performed for further characterization.  The neoplastic cells are positive for CD56 with strong staining of over  90% of cells. They are negative for TTF-1 and p40.    IMPRESSION: 1. LEFT suprahilar mass constricting LEFT upper lobe bronchus consistent with primary bronchogenic carcinoma. 2. Ipsilateral mediastinal nodal metastasis. 3. Distant visceral metastasis to the LIVER and bilateral ADRENAL GLANDS. 4. Distant nodal metastasis to the LEFT external iliac node. 5. Multifocal hypermetabolic skeletal metastasis (approximately 40 lesions). Lesions are occult by CT imaging. 6. Focal activity beneath the skin posterior to the RIGHT ear. Differential includes nodal metastasis versus primary parotid neoplasm. Favor nodal metastasis. (Recommend attention on brain MRI workup).     Electronically Signed   By: Suzy Bouchard M.D.   On: 02/06/2022 13:00  # April 2023-brain MRI incidental/screening 4 mm x 2 cerebellar lesions.  # MAY 1st, 2023- carboo-Etop [d-1-3]; udenyca   Primary cancer of left upper lobe of lung ([place])  02/16/2022 Initial Diagnosis   Primary cancer of left upper lobe of  lung ([place])   02/16/2022 Cancer Staging   Staging form: Lung, AJCC 8th Edition -  Clinical: Stage IVB (cT3, cN2, pM1c) - Signed by Cammie Sickle, MD on 02/16/2022 Stage prefix: Initial diagnosis   02/20/2022 - 05/15/2022 Chemotherapy   Patient is on Treatment Plan : LUNG SCLC Carboplatin + Etoposide + Atezolizumab Induction q21d / Atezolizumab Maintenance q21d     02/20/2022 -  Chemotherapy   Patient is on Treatment Plan : LUNG SCLC Carboplatin + Etoposide + Atezolizumab Induction q21d x 4 cycles / Atezolizumab Maintenance q21d       ALLERGIES:  is allergic to codeine.  MEDICATIONS:  Current Outpatient Medications  Medication Sig Dispense Refill   albuterol (PROVENTIL HFA) 108 (90 Base) MCG/ACT inhaler Inhale 2 puffs into the lungs every 4 (four) hours as needed for wheezing or shortness of breath. 1 each 0   Budeson-Glycopyrrol-Formoterol (BREZTRI AEROSPHERE) 160-9-4.8 MCG/ACT AERO Inhale 2 puffs into the lungs in the morning and at bedtime.     lidocaine-prilocaine (EMLA) cream Apply on the port. 30 -45 min  prior to port access. 30 g 3   ondansetron (ZOFRAN) 8 MG tablet One pill every 8 hours as needed for nausea/vomitting. 40 tablet 1   oxyCODONE-acetaminophen (PERCOCET) 10-325 MG tablet Take 1 tablet by mouth every 6 (six) hours as needed for pain. 60 tablet 0   potassium chloride SA (KLOR-CON M20) 20 MEQ tablet 1 TABLET TWICE A DAY 60 tablet 1   predniSONE (DELTASONE) 20 MG tablet Take 1 tablet (20 mg total) by mouth daily with breakfast. Take 3 pills once a day for 1 week; 2 pills once a day for 1 week; and then 1 pill a day.  Take it in the morning with food. 45 tablet 0   prochlorperazine (COMPAZINE) 10 MG tablet Take 1 tablet (10 mg total) by mouth every 6 (six) hours as needed for nausea or vomiting. 40 tablet 1   rosuvastatin (CRESTOR) 5 MG tablet Take 5 mg by mouth at bedtime.     No current facility-administered medications for this visit.   Facility-Administered Medications Ordered in Other Visits  Medication Dose Route Frequency Provider Last Rate  Last Admin   0.9 %  sodium chloride infusion   Intravenous Continuous Brooke Payes, Kirt Boys, NP 999 mL/hr at 06/23/22 1555 New Bag at 06/23/22 1555   dexamethasone (DECADRON) 10 mg in sodium chloride 0.9 % 50 mL IVPB  10 mg Intravenous Once Mercy Malena R, NP 204 mL/hr at 06/23/22 1603 10 mg at 06/23/22 1603   dexamethasone (DECADRON) 10 MG/ML injection            heparin lock flush 100 UNIT/ML injection            ondansetron (ZOFRAN) injection 8 mg  8 mg Intravenous Once Evona Westra, Kirt Boys, NP        VITAL SIGNS: There were no vitals taken for this visit. There were no vitals filed for this visit.  Estimated body mass index is 23.37 kg/m as calculated from the following:   Height as of 06/05/22: 5\' 7"  (1.702 m).   Weight as of 06/05/22: 149 lb 3.2 oz (67.7 kg).  LABS: CBC:    Component Value Date/Time   WBC 15.3 (H) 06/23/2022 1555   HGB 15.1 06/23/2022 1555   HCT 47.6 06/23/2022 1555   PLT 170 06/23/2022 1555   MCV 97.1 06/23/2022 1555   NEUTROABS 11.6 (H) 06/23/2022 1555   LYMPHSABS 2.3 06/23/2022 1555   MONOABS 1.2 (  H) 06/23/2022 1555   EOSABS 0.1 06/23/2022 1555   BASOSABS 0.0 06/23/2022 1555   Comprehensive Metabolic Panel:    Component Value Date/Time   NA 137 06/05/2022 0810   K 3.2 (L) 06/05/2022 0810   CL 102 06/05/2022 0810   CO2 25 06/05/2022 0810   BUN 13 06/05/2022 0810   CREATININE 0.90 06/05/2022 0810   GLUCOSE 141 (H) 06/05/2022 0810   CALCIUM 9.4 06/05/2022 0810   AST 53 (H) 06/05/2022 0810   ALT 44 06/05/2022 0810   ALKPHOS 102 06/05/2022 0810   BILITOT 0.8 06/05/2022 0810   PROT 7.6 06/05/2022 0810   ALBUMIN 4.0 06/05/2022 0810    RADIOGRAPHIC STUDIES: No results found.  PERFORMANCE STATUS (ECOG) : 1 - Symptomatic but completely ambulatory  Review of Systems Unless otherwise noted, a complete review of systems is negative.  Physical Exam General: NAD Cardiovascular: regular rate and rhythm Pulmonary: clear ant fields Abdomen: soft,  nontender, + bowel sounds GU: no suprapubic tenderness Extremities: no edema, no joint deformities Skin: no rashes Neurological: Weakness but otherwise nonfocal  IMPRESSION/PLAN: Nausea/vomiting -patient nontoxic-appearing.  He was ambulatory into clinic.  Exam is benign.  Vitals are stable.  Labs grossly unchanged from baseline other than slight increase in serum bilirubin, which is likely attributed to Tecentriq.  We will proceed with IV fluids/steroids/antiemetics.  Patient is taking ondansetron but is having difficulty keeping it down.  We will switch to ODT.  He has not found much benefit from prochlorperazine and is not regularly taking it.  We will DC prochlorperazine and start olanzapine at bedtime.  Continue supportive care.  Neoplasm related pain - refill Percocet. PDMP reviewed.   RTC next week  Patient expressed understanding and was in agreement with this plan. He also understands that He can call clinic at any time with any questions, concerns, or complaints.   Thank you for allowing me to participate in the care of this very pleasant patient.   Time Total: 20 minutes  Visit consisted of counseling and education dealing with the complex and emotionally intense issues of symptom management in the setting of serious illness.Greater than 50%  of this time was spent counseling and coordinating care related to the above assessment and plan.  Signed by: Altha Harm, PhD, NP-C

## 2022-06-23 NOTE — Telephone Encounter (Signed)
Spoke with patient's wife.  Patient has been symptomatic all week with nausea vomiting and is now progressively weak and not eating or drinking.  He has had 4 episodes of vomiting today.  No fever or chills.  He has been unable to keep down antiemetics.  I recommended that he go to the ED for further evaluation management the patient's wife states that he refuses to go to the emergency department and will only agree to be seen by [place] in clinic.  I explained that most likely my recommendation even if I saw him would be to send him to the emergency department if she asked repeatedly if we could evaluate him in clinic.  We will bring him in for labs provider visit and possible fluids.

## 2022-06-23 NOTE — Telephone Encounter (Signed)
Mrs Davtyan called asking Merrily Pew to Please call her ASAP because patient has been sick all week and told her not to call or her would remove her from list to be able to speak to. She states he is vomiting non stop, very weak, coughing non productively. Denies fever and she said there is more that she will discuss with JB when he calls her and that he is aware of the situation with him. She mentioned that he is to walk his daughter down the aisle and something about the doctors were supposed to get together to discuss what to do for him so he could do this, but he has not heard back about that and his next appointment is not until after the wedding.

## 2022-06-26 ENCOUNTER — Other Ambulatory Visit: Payer: Self-pay | Admitting: Oncology

## 2022-06-26 ENCOUNTER — Telehealth: Payer: Self-pay | Admitting: Oncology

## 2022-06-26 MED ORDER — BACLOFEN 10 MG PO TABS: 5.0000 mg | ORAL_TABLET | Freq: Three times a day (TID) | ORAL | 0 refills | Status: DC

## 2022-06-26 NOTE — Telephone Encounter (Signed)
Wife called to report that patient has persistent hiccups, causeing him to have nausea and vomit.  He has zofran ODT, has been swallowing the pill. He used last time with some relief of nausea.  Recommend patient to utilize Zofran and uee sublingually.  Baclofen 5-10mg  TID Rx sent to pharmacy for hiccups.  Cc Dr. Sharmaine Base team to follow tomorrow.

## 2022-06-27 ENCOUNTER — Telehealth: Payer: Self-pay | Admitting: *Deleted

## 2022-06-27 NOTE — Telephone Encounter (Signed)
Per Merrily Pew- Pt is scheduled tomorrow to see Dr. Jacinto Reap-. He can be evaluated for these concerns tomorrow at is scheduled apt.

## 2022-06-27 NOTE — Telephone Encounter (Signed)
CAll returned to [facility] and informed that they can discuss concerns at [place] appointment, she apologized and said she forgot he had appointment tomorrow and was fine waiting until then

## 2022-06-27 NOTE — Telephone Encounter (Signed)
Kim called reporting that the Baclofen is only lasting 6 hours and not 8 hours he hiccups to the point that he vomits. He is now alternating baclofen 10 mg and Zofran  to get some relief. She is also asking if he is to continue taking Prednisone because he is almost out of that prescription. She had originally called asking for number to call hospice to speak with them and see what they offer. She feels he has given up  now that his daughter is married and he was able to walk her down the aisle.  Please advise re hiccups and prednisone

## 2022-06-28 ENCOUNTER — Telehealth: Payer: Self-pay | Admitting: *Deleted

## 2022-06-28 ENCOUNTER — Other Ambulatory Visit: Payer: Self-pay | Admitting: Internal Medicine

## 2022-06-28 ENCOUNTER — Inpatient Hospital Stay: Payer: BC Managed Care – PPO

## 2022-06-28 ENCOUNTER — Encounter: Payer: Self-pay | Admitting: Internal Medicine

## 2022-06-28 ENCOUNTER — Inpatient Hospital Stay (HOSPITAL_BASED_OUTPATIENT_CLINIC_OR_DEPARTMENT_OTHER): Payer: BC Managed Care – PPO | Admitting: Internal Medicine

## 2022-06-28 VITALS — BP 141/105 | HR 82 | Temp 95.0°F | Ht 67.0 in | Wt 143.6 lb

## 2022-06-28 VITALS — BP 130/87 | HR 68

## 2022-06-28 DIAGNOSIS — Z5112 Encounter for antineoplastic immunotherapy: Secondary | ICD-10-CM | POA: Diagnosis not present

## 2022-06-28 DIAGNOSIS — C3412 Malignant neoplasm of upper lobe, left bronchus or lung: Secondary | ICD-10-CM

## 2022-06-28 LAB — CBC WITH DIFFERENTIAL/PLATELET
Abs Immature Granulocytes: 0.04 10*3/uL (ref 0.00–0.07)
Basophils Absolute: 0 10*3/uL (ref 0.0–0.1)
Basophils Relative: 0 %
Eosinophils Absolute: 0.2 10*3/uL (ref 0.0–0.5)
Eosinophils Relative: 2 %
HCT: 48.1 % (ref 39.0–52.0)
Hemoglobin: 15.7 g/dL (ref 13.0–17.0)
Immature Granulocytes: 0 %
Lymphocytes Relative: 20 %
Lymphs Abs: 2.4 10*3/uL (ref 0.7–4.0)
MCH: 31 pg (ref 26.0–34.0)
MCHC: 32.6 g/dL (ref 30.0–36.0)
MCV: 95.1 fL (ref 80.0–100.0)
Monocytes Absolute: 0.9 10*3/uL (ref 0.1–1.0)
Monocytes Relative: 8 %
Neutro Abs: 8.3 10*3/uL — ABNORMAL HIGH (ref 1.7–7.7)
Neutrophils Relative %: 70 %
Platelets: 162 10*3/uL (ref 150–400)
RBC: 5.06 MIL/uL (ref 4.22–5.81)
RDW: 13.2 % (ref 11.5–15.5)
WBC: 12 10*3/uL — ABNORMAL HIGH (ref 4.0–10.5)
nRBC: 0 % (ref 0.0–0.2)

## 2022-06-28 LAB — COMPREHENSIVE METABOLIC PANEL
ALT: 45 U/L — ABNORMAL HIGH (ref 0–44)
AST: 20 U/L (ref 15–41)
Albumin: 4.3 g/dL (ref 3.5–5.0)
Alkaline Phosphatase: 62 U/L (ref 38–126)
Anion gap: 7 (ref 5–15)
BUN: 24 mg/dL — ABNORMAL HIGH (ref 6–20)
CO2: 28 mmol/L (ref 22–32)
Calcium: 9.5 mg/dL (ref 8.9–10.3)
Chloride: 103 mmol/L (ref 98–111)
Creatinine, Ser: 0.94 mg/dL (ref 0.61–1.24)
GFR, Estimated: >60 mL/min (ref >60)
Glucose, Bld: 103 mg/dL — ABNORMAL HIGH (ref 70–99)
Potassium: 3.7 mmol/L (ref 3.5–5.1)
Sodium: 138 mmol/L (ref 135–145)
Total Bilirubin: 1.6 mg/dL — ABNORMAL HIGH (ref 0.3–1.2)
Total Protein: 7.7 g/dL (ref 6.5–8.1)

## 2022-06-28 MED ORDER — PANTOPRAZOLE SODIUM 40 MG PO TBEC: 40.0000 mg | DELAYED_RELEASE_TABLET | Freq: Two times a day (BID) | ORAL | 1 refills | Status: AC

## 2022-06-28 MED ORDER — METOCLOPRAMIDE HCL 10 MG PO TABS: 10.0000 mg | ORAL_TABLET | Freq: Three times a day (TID) | ORAL | 0 refills | Status: DC

## 2022-06-28 MED ORDER — SODIUM CHLORIDE 0.9 % IV SOLN
Freq: Once | INTRAVENOUS | Status: AC
  Filled 2022-06-28: qty 250

## 2022-06-28 MED ORDER — PREDNISONE 20 MG PO TABS: 20.0000 mg | ORAL_TABLET | Freq: Every day | ORAL | 0 refills | Status: DC

## 2022-06-28 MED ORDER — CHLORPROMAZINE HCL 25 MG PO TABS: 25.0000 mg | ORAL_TABLET | Freq: Three times a day (TID) | ORAL | 0 refills | Status: AC

## 2022-06-28 MED ORDER — ZOLEDRONIC ACID 4 MG/100ML IV SOLN
4.0000 mg | Freq: Once | INTRAVENOUS | Status: AC
  Administered 2022-06-28: 4 mg via INTRAVENOUS
  Filled 2022-06-28: qty 100

## 2022-06-28 MED ORDER — SODIUM CHLORIDE 0.9 % IV SOLN
1200.0000 mg | Freq: Once | INTRAVENOUS | Status: AC
  Administered 2022-06-28: 1200 mg via INTRAVENOUS
  Filled 2022-06-28: qty 20

## 2022-06-28 MED ORDER — HEPARIN SOD (PORK) LOCK FLUSH 100 UNIT/ML IV SOLN
500.0000 [IU] | Freq: Once | INTRAVENOUS | Status: AC | PRN
  Administered 2022-06-28: 500 [IU]
  Filled 2022-06-28: qty 5

## 2022-06-28 NOTE — Assessment & Plan Note (Addendum)
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 April 2023-brain MRI-4 mm x 2 metastases noted.  Currently on [place] etoposide-Tecentriq every 3 weeks x 4 cycles; followed by maintenance Tecentriq post chemotherapy. JUNE 26th, 2023- IMPRESSION: Significant response to therapy [both in lung and liver; no new disease]; 4 mm right lower lobe nodule-monitor.  See incidental findings.  #  Cycle #6 of Tecentriq-was held a month ago because of worsening joint pains [see below]; discussed the pro and cons of treatment.   # Proceed with Cycle #6  of Tecentriq-Labs today reviewed;  acceptable for treatment today.   # Intractable hiccups-on baclofen- not resolved; discontinue baclofen. Added Thorazine; reglan- added PPI.  # elevated BP- recheck prior to treatment today.   # worsening joint pains [knees, wrist, shoulder pain]- ? From Atezo- Immune sideffects. On prednisone Improved; not resolved- Take half a pill a day for 1 week; and then half a pill every other day; and then stop.  Take it in the morning with food.  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 s/p Zometa-4 mg IV every  6 weeks- Stable; see above.   #Hypokalemia potassium 3.7 secondary chemotherapy--STABLE-continue K-Dur one a day.  # #Brain metastases : April 2023-MRI brain 4 mm x 2 [asymptomatic/screening MRI]-NO WBRT.  Status post chemo-chemotherapy.  JUNE 2023-resolution of brain metastases-STABLE.   # Smoking: Active smoker;  the process of quitting. STABLE.   # COPD: Continue inhalers [Dr.Aleskerov]- STABLE.   # DISPOSITION:  # Tecentriq and zometa today- await for labs  # follow up in 3 weeks with MD; labs-cbc/cmp-Tecentriq; CT CAP-  Dr.B.

## 2022-06-28 NOTE — Progress Notes (Signed)
C/o hiccups, fatigue and weakness. Sometimes hard to eat due to hiccups.

## 2022-06-28 NOTE — Telephone Encounter (Signed)
Spoke with pt's wife and discussed the meds Dr. B gave this morning.

## 2022-06-28 NOTE — Patient Instructions (Signed)
[facility] AT [place]  Discharge Instructions: Thank you for choosing [facility] to provide your oncology and hematology care.  If you have a lab appointment with the [facility], please go directly to the [facility] and check in at the registration area.  Wear comfortable clothing and clothing appropriate for easy access to any Portacath or PICC line.   We strive to give you quality time with your provider. You may need to reschedule your appointment if you arrive late (15 or more minutes).  Arriving late affects you and other patients whose appointments are after yours.  Also, if you miss three or more appointments without notifying the office, you may be dismissed from the clinic at the provider's discretion.      For prescription refill requests, have your pharmacy contact our office and allow 72 hours for refills to be completed.    Today you received the following chemotherapy and/or immunotherapy agents : Tecentriq      To help prevent nausea and vomiting after your treatment, we encourage you to take your nausea medication as directed.  BELOW ARE SYMPTOMS THAT SHOULD BE REPORTED IMMEDIATELY: *FEVER GREATER THAN 100.4 F (38 C) OR HIGHER *CHILLS OR SWEATING *NAUSEA AND VOMITING THAT IS NOT CONTROLLED WITH YOUR NAUSEA MEDICATION *UNUSUAL SHORTNESS OF BREATH *UNUSUAL BRUISING OR BLEEDING *URINARY PROBLEMS (pain or burning when urinating, or frequent urination) *BOWEL PROBLEMS (unusual diarrhea, constipation, pain near the anus) TENDERNESS IN MOUTH AND THROAT WITH OR WITHOUT PRESENCE OF ULCERS (sore throat, sores in mouth, or a toothache) UNUSUAL RASH, SWELLING OR PAIN  UNUSUAL VAGINAL DISCHARGE OR ITCHING   Items with * indicate a potential emergency and should be followed up as soon as possible or go to the Emergency Department if any problems should occur.  Please show the CHEMOTHERAPY ALERT CARD or IMMUNOTHERAPY ALERT CARD at check-in to  the Emergency Department and triage nurse.  Should you have questions after your visit or need to cancel or reschedule your appointment, please contact [facility] [place] AT [place]  [phone] and follow the prompts.  Office hours are 8:00 a.m. to 4:30 p.m. Monday - Friday. Please note that voicemails left after 4:00 p.m. may not be returned until the following business day.  We are closed weekends and major holidays. You have access to a nurse at all times for urgent questions. Please call the main number to the clinic [phone] and follow the prompts.  For any non-urgent questions, you may also contact your provider using MyChart. We now offer e-Visits for anyone 72 and older to request care online for non-urgent symptoms. For details visit [email or web address].   Also download the MyChart app! Go to the app store, search "MyChart", open the app, select [facility], and log in with your MyChart username and password.  Masks are optional in the cancer centers. If you would like for your care team to wear a mask while they are taking care of you, please let them know. For doctor visits, patients may have with them one support person who is at least 50 years old. At this time, visitors are not allowed in the infusion area.

## 2022-06-28 NOTE — Progress Notes (Signed)
[place] CONSULT NOTE  Patient Care Team: Tracie Harrier, MD as PCP - General (Internal Medicine) Telford Nab, RN as Oncology Nurse Navigator Cammie Sickle, MD as Consulting Physician (Oncology)  CHIEF COMPLAINTS/PURPOSE OF CONSULTATION: lung cancer  #  Oncology History Overview Note  # . LUNG, LEFT UPPER LOBE; ENB-GUIDED TRANSBRONCHIAL FORCEPS BIOPSY: - SMALL CELL CARCINOMA.  [Dr.Aleskerov]  Comment:  Invasive carcinoma is present in two fragments, with crush artifact. The  cells are small, with scant cytoplasm, nuclear molding, and  inconspicuous nucleoli. There is necrosis.   Immunohistochemistry (IHC) was performed for further characterization.  The neoplastic cells are positive for CD56 with strong staining of over  90% of cells. They are negative for TTF-1 and p40.    IMPRESSION: 1. LEFT suprahilar mass constricting LEFT upper lobe bronchus consistent with primary bronchogenic carcinoma. 2. Ipsilateral mediastinal nodal metastasis. 3. Distant visceral metastasis to the LIVER and bilateral ADRENAL GLANDS. 4. Distant nodal metastasis to the LEFT external iliac node. 5. Multifocal hypermetabolic skeletal metastasis (approximately 40 lesions). Lesions are occult by CT imaging. 6. Focal activity beneath the skin posterior to the RIGHT ear. Differential includes nodal metastasis versus primary parotid neoplasm. Favor nodal metastasis. (Recommend attention on brain MRI workup).     Electronically Signed   By: Suzy Bouchard M.D.   On: 02/06/2022 13:00  # April 2023-brain MRI incidental/screening 4 mm x 2 cerebellar lesions.  # MAY 1st, 2023- carboo-Etop [d-1-3]; udenyca   Primary cancer of left upper lobe of lung ([place])  02/16/2022 Initial Diagnosis   Primary cancer of left upper lobe of lung ([place])   02/16/2022 Cancer Staging   Staging form: Lung, AJCC 8th Edition - Clinical: Stage IVB (cT3, cN2, pM1c) - Signed by Cammie Sickle,  MD on 02/16/2022 Stage prefix: Initial diagnosis   02/20/2022 - 05/15/2022 Chemotherapy   Patient is on Treatment Plan : LUNG SCLC Carboplatin + Etoposide + Atezolizumab Induction q21d / Atezolizumab Maintenance q21d     02/20/2022 -  Chemotherapy   Patient is on Treatment Plan : LUNG SCLC Carboplatin + Etoposide + Atezolizumab Induction q21d x 4 cycles / Atezolizumab Maintenance q21d      HISTORY OF PRESENTING ILLNESS: Ambulating independently.  Accompanied by his wife.  Jose Morse 51 y.o.  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   Patient immunotherapy is current on hold for because of worsening joint pains.  He is also started on prednisone.  Joint pains improved.  Not resolved.  Patient noted to have intractable hiccups over the last few days.  Baclofen has been titrated up to 45 mg a day.   Denies any worsening shortness of breath or cough.  Denies hemoptysis.  Complains of mild headach this morning.  Appetite is good.    Review of Systems  Constitutional:  Positive for malaise/fatigue. Negative for chills, diaphoresis, fever and weight loss.  HENT:  Negative for nosebleeds and sore throat.   Eyes:  Negative for double vision.  Respiratory:  Positive for wheezing. Negative for cough, hemoptysis, sputum production and shortness of breath.   Cardiovascular:  Positive for chest pain. Negative for palpitations, orthopnea and leg swelling.  Gastrointestinal:  Negative for abdominal pain, blood in stool, constipation, diarrhea, heartburn, melena, nausea and vomiting.  Genitourinary:  Negative for dysuria, frequency and urgency.  Musculoskeletal:  Positive for back pain and joint pain.  Skin: Negative.  Negative for itching and rash.  Neurological:  Negative for dizziness, tingling, focal weakness, weakness and headaches.  Endo/Heme/Allergies:  Does not bruise/bleed easily.  Psychiatric/Behavioral:  Negative  for depression. The patient is not nervous/anxious and does not have insomnia.      MEDICAL HISTORY:  Past Medical History:  Diagnosis Date   History of kidney stones    Hypercholesteremia     SURGICAL HISTORY: Past Surgical History:  Procedure Laterality Date   CHOLECYSTECTOMY     IR IMAGING GUIDED PORT INSERTION  02/23/2022   VIDEO BRONCHOSCOPY WITH ENDOBRONCHIAL NAVIGATION N/A 02/10/2022   Procedure: VIDEO BRONCHOSCOPY WITH ENDOBRONCHIAL NAVIGATION;  Surgeon: Ottie Glazier, MD;  Location: [facility]S;  Service: Thoracic;  Laterality: N/A;   VIDEO BRONCHOSCOPY WITH ENDOBRONCHIAL ULTRASOUND N/A 02/10/2022   Procedure: VIDEO BRONCHOSCOPY WITH ENDOBRONCHIAL ULTRASOUND;  Surgeon: Ottie Glazier, MD;  Location: [facility];  Service: Thoracic;  Laterality: N/A;    SOCIAL HISTORY: Social History   Socioeconomic History   Marital status: Married    Spouse name: Not on file   Number of children: Not on file   Years of education: Not on file   Highest education level: Not on file  Occupational History   Not on file  Tobacco Use   Smoking status: Every Day    Packs/day: 0.50    Years: 34.00    Total pack years: 17.00    Types: Cigarettes   Smokeless tobacco: Never  Vaping Use   Vaping Use: Never used  Substance and Sexual Activity   Alcohol use: No   Drug use: Not Currently    Types: Marijuana   Sexual activity: Yes  Other Topics Concern   Not on file  Social History Narrative   Lives in [place]; with wife/son- 36 y [2023]; works in [employer]. Smoker; no alcohol.    Social Determinants of Health   Financial Resource Strain: Not on file  Food Insecurity: Not on file  Transportation Needs: Not on file  Physical Activity: Not on file  Stress: Not on file  Social Connections: Not on file  Intimate Partner Violence: Not on file    FAMILY HISTORY: Family History  Problem Relation Age of Onset   Ovarian cancer Mother    Colon cancer Father    Diabetes Father      ALLERGIES:  is allergic to codeine.  MEDICATIONS:  Current Outpatient Medications  Medication Sig Dispense Refill   albuterol (PROVENTIL HFA) 108 (90 Base) MCG/ACT inhaler Inhale 2 puffs into the lungs every 4 (four) hours as needed for wheezing or shortness of breath. 1 each 0   baclofen (LIORESAL) 10 MG tablet Take 0.5-1 tablets (5-10 mg total) by mouth 3 (three) times daily. 10 each 0   Budeson-Glycopyrrol-Formoterol (BREZTRI AEROSPHERE) 160-9-4.8 MCG/ACT AERO Inhale 2 puffs into the lungs in the morning and at bedtime.     chlorproMAZINE (THORAZINE) 25 MG tablet Take 1 tablet (25 mg total) by mouth 3 (three) times daily. 45 tablet 0   lidocaine-prilocaine (EMLA) cream Apply on the port. 30 -45 min  prior to port access. 30 g 3   metoCLOPramide (REGLAN) 10 MG tablet Take 1 tablet (10 mg total) by mouth 4 (four) times daily -  before meals and at bedtime. 60 tablet 0   ondansetron (ZOFRAN) 8 MG tablet One pill every 8 hours as needed for nausea/vomitting. 40 tablet 1   ondansetron (ZOFRAN-ODT) 8 MG disintegrating tablet Take 1 tablet (8 mg total) by mouth every 8 (eight) hours as needed for nausea or vomiting. 45 tablet 0  oxyCODONE-acetaminophen (PERCOCET) 10-325 MG tablet Take 1 tablet by mouth every 6 (six) hours as needed for pain. 60 tablet 0   pantoprazole (PROTONIX) 40 MG tablet Take 1 tablet (40 mg total) by mouth 2 (two) times daily. 1 hour prior to breakfast/dinner. 60 each 1   potassium chloride SA (KLOR-CON M20) 20 MEQ tablet 1 TABLET TWICE A DAY 60 tablet 1   rosuvastatin (CRESTOR) 5 MG tablet Take 5 mg by mouth at bedtime.     predniSONE (DELTASONE) 20 MG tablet Take 1 tablet (20 mg total) by mouth daily with breakfast. Take half a pill a day for 1 week; and then half a pill every other day; and then stop.  Take it in the morning with food. 10 tablet 0   No current facility-administered medications for this visit.   Facility-Administered Medications Ordered in Other Visits   Medication Dose Route Frequency Provider Last Rate Last Admin   atezolizumab (TECENTRIQ) 1,200 mg in sodium chloride 0.9 % 250 mL chemo infusion  1,200 mg Intravenous Once Cammie Sickle, MD       dexamethasone (DECADRON) 10 MG/ML injection            heparin lock flush 100 UNIT/ML injection            Zoledronic Acid (ZOMETA) IVPB 4 mg  4 mg Intravenous Once Charlaine Dalton R, MD          Left chest wall skin rash anterior-maculopapular skin rash.;  Not in dermatomal distribution.  PHYSICAL EXAMINATION: ECOG PERFORMANCE STATUS: 1 - Symptomatic but completely ambulatory  Vitals:   06/28/22 0824  BP: (!) 141/105  Pulse: 82  Temp: (!) 95 F (35 C)  SpO2: 100%   Filed Weights   06/28/22 0824  Weight: 143 lb 9.6 oz (65.1 kg)    Physical Exam Vitals and nursing note reviewed.  HENT:     Head: Normocephalic and atraumatic.     Mouth/Throat:     Pharynx: Oropharynx is clear.  Eyes:     Extraocular Movements: Extraocular movements intact.     Pupils: Pupils are equal, round, and reactive to light.  Cardiovascular:     Rate and Rhythm: Normal rate and regular rhythm.  Pulmonary:     Comments: Decreased breath sounds bilaterally.  Abdominal:     Palpations: Abdomen is soft.  Musculoskeletal:        General: Normal range of motion.     Cervical back: Normal range of motion.  Skin:    General: Skin is warm.  Neurological:     General: No focal deficit present.     Mental Status: He is alert and oriented to person, place, and time.  Psychiatric:        Behavior: Behavior normal.        Judgment: Judgment normal.      LABORATORY DATA:  I have reviewed the data as listed Lab Results  Component Value Date   WBC 12.0 (H) 06/28/2022   HGB 15.7 06/28/2022   HCT 48.1 06/28/2022   MCV 95.1 06/28/2022   PLT 162 06/28/2022   Recent Labs    06/05/22 0810 06/23/22 1555 06/28/22 0815  NA 137 139 138  K 3.2* 3.9 3.7  CL 102 102 103  CO2 25 24 28   GLUCOSE  141* 91 103*  BUN 13 31* 24*  CREATININE 0.90 0.86 0.94  CALCIUM 9.4 9.5 9.5  GFRNONAA >60 >60 >60  PROT 7.6 7.9 7.7  ALBUMIN 4.0 4.4 4.3  AST 53* 23 20  ALT 44 64* 45*  ALKPHOS 102 69 62  BILITOT 0.8 1.4* 1.6*    RADIOGRAPHIC STUDIES: I have personally reviewed the radiological images as listed and agreed with the findings in the report. No results found.  ASSESSMENT & PLAN:   Primary cancer of left upper lobe of lung ([place]) #Extensive stage small cell lung cancer /stage IV. April 2023-brain MRI-4 mm x 2 metastases noted.  Currently on [place] etoposide-Tecentriq every 3 weeks x 4 cycles; followed by maintenance Tecentriq post chemotherapy. JUNE 26th, 2023- IMPRESSION: Significant response to therapy [both in lung and liver; no new disease]; 4 mm right lower lobe nodule-monitor.  See incidental findings.  #  Cycle #6 of Tecentriq-was held a month ago because of worsening joint pains [see below]; discussed the pro and cons of treatment.   # Proceed with Cycle #6  of Tecentriq-Labs today reviewed;  acceptable for treatment today.   # Intractable hiccups-on baclofen- not resolved; discontinue baclofen. Added Thorazine; reglan- added PPI.  # elevated BP- recheck prior to treatment today.   # worsening joint pains [knees, wrist, shoulder pain]- ? From Atezo- Immune sideffects. On prednisone Improved; not resolved- Take half a pill a day for 1 week; and then half a pill every other day; and then stop.  Take it in the morning with food.  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 s/p Zometa-4 mg IV every  6 weeks- Stable; see above.   #Hypokalemia potassium 3.7 secondary chemotherapy--STABLE-continue K-Dur one a day.  # #Brain metastases : April 2023-MRI brain 4 mm x 2 [asymptomatic/screening MRI]-NO WBRT.  Status post chemo-chemotherapy.  JUNE 2023-resolution of brain metastases-STABLE.    # Smoking: Active smoker;  the process of quitting. STABLE.   # COPD: Continue inhalers [Dr.Aleskerov]- STABLE.   # DISPOSITION:  # Tecentriq and zometa today- await for labs  # follow up in 3 weeks with MD; labs-cbc/cmp-Tecentriq; CT CAP-  Dr.B.       All questions were answered. The patient knows to call the clinic with any problems, questions or concerns.       Cammie Sickle, MD 06/28/2022 10:04 AM

## 2022-06-28 NOTE — Telephone Encounter (Signed)
Mrs Higginbotham called stating that there were several medicine changes today and she would like for Dr Sharmaine Base nurse to call her and go over his medications with her. [phone]

## 2022-06-29 ENCOUNTER — Other Ambulatory Visit: Payer: Self-pay

## 2022-06-29 ENCOUNTER — Emergency Department: Payer: BC Managed Care – PPO

## 2022-06-29 ENCOUNTER — Inpatient Hospital Stay
Admission: EM | Admit: 2022-06-29 | Discharge: 2022-07-01 | DRG: 098 | Disposition: A | Discharge status: Hospice | Payer: BC Managed Care – PPO | Attending: Internal Medicine | Admitting: Internal Medicine

## 2022-06-29 ENCOUNTER — Observation Stay: Payer: BC Managed Care – PPO

## 2022-06-29 DIAGNOSIS — C801 Malignant (primary) neoplasm, unspecified: Secondary | ICD-10-CM

## 2022-06-29 DIAGNOSIS — C771 Secondary and unspecified malignant neoplasm of intrathoracic lymph nodes: Secondary | ICD-10-CM | POA: Diagnosis present

## 2022-06-29 DIAGNOSIS — Z79899 Other long term (current) drug therapy: Secondary | ICD-10-CM

## 2022-06-29 DIAGNOSIS — K219 Gastro-esophageal reflux disease without esophagitis: Secondary | ICD-10-CM | POA: Diagnosis present

## 2022-06-29 DIAGNOSIS — R6520 Severe sepsis without septic shock: Secondary | ICD-10-CM | POA: Diagnosis present

## 2022-06-29 DIAGNOSIS — F419 Anxiety disorder, unspecified: Secondary | ICD-10-CM | POA: Diagnosis present

## 2022-06-29 DIAGNOSIS — R066 Hiccough: Secondary | ICD-10-CM | POA: Diagnosis present

## 2022-06-29 DIAGNOSIS — J449 Chronic obstructive pulmonary disease, unspecified: Secondary | ICD-10-CM | POA: Diagnosis present

## 2022-06-29 DIAGNOSIS — C3412 Malignant neoplasm of upper lobe, left bronchus or lung: Secondary | ICD-10-CM | POA: Diagnosis present

## 2022-06-29 DIAGNOSIS — E7800 Pure hypercholesterolemia, unspecified: Secondary | ICD-10-CM | POA: Diagnosis present

## 2022-06-29 DIAGNOSIS — Z885 Allergy status to narcotic agent status: Secondary | ICD-10-CM

## 2022-06-29 DIAGNOSIS — Z20822 Contact with and (suspected) exposure to covid-19: Secondary | ICD-10-CM | POA: Diagnosis present

## 2022-06-29 DIAGNOSIS — Z66 Do not resuscitate: Secondary | ICD-10-CM | POA: Diagnosis present

## 2022-06-29 DIAGNOSIS — R569 Unspecified convulsions: Secondary | ICD-10-CM

## 2022-06-29 DIAGNOSIS — C7951 Secondary malignant neoplasm of bone: Secondary | ICD-10-CM | POA: Diagnosis present

## 2022-06-29 DIAGNOSIS — Z9221 Personal history of antineoplastic chemotherapy: Secondary | ICD-10-CM | POA: Diagnosis not present

## 2022-06-29 DIAGNOSIS — R4182 Altered mental status, unspecified: Secondary | ICD-10-CM

## 2022-06-29 DIAGNOSIS — Z515 Encounter for palliative care: Secondary | ICD-10-CM

## 2022-06-29 DIAGNOSIS — F17210 Nicotine dependence, cigarettes, uncomplicated: Secondary | ICD-10-CM | POA: Diagnosis present

## 2022-06-29 DIAGNOSIS — G038 Meningitis due to other specified causes: Secondary | ICD-10-CM | POA: Diagnosis present

## 2022-06-29 DIAGNOSIS — Z7951 Long term (current) use of inhaled steroids: Secondary | ICD-10-CM

## 2022-06-29 DIAGNOSIS — D849 Immunodeficiency, unspecified: Secondary | ICD-10-CM | POA: Diagnosis present

## 2022-06-29 DIAGNOSIS — C7949 Secondary malignant neoplasm of other parts of nervous system: Secondary | ICD-10-CM | POA: Diagnosis not present

## 2022-06-29 DIAGNOSIS — Z9049 Acquired absence of other specified parts of digestive tract: Secondary | ICD-10-CM

## 2022-06-29 DIAGNOSIS — E785 Hyperlipidemia, unspecified: Secondary | ICD-10-CM | POA: Diagnosis not present

## 2022-06-29 DIAGNOSIS — Z720 Tobacco use: Secondary | ICD-10-CM

## 2022-06-29 DIAGNOSIS — C7931 Secondary malignant neoplasm of brain: Secondary | ICD-10-CM | POA: Diagnosis present

## 2022-06-29 DIAGNOSIS — R824 Acetonuria: Secondary | ICD-10-CM

## 2022-06-29 DIAGNOSIS — A419 Sepsis, unspecified organism: Secondary | ICD-10-CM | POA: Diagnosis present

## 2022-06-29 DIAGNOSIS — E872 Acidosis, unspecified: Secondary | ICD-10-CM | POA: Diagnosis present

## 2022-06-29 DIAGNOSIS — R6510 Systemic inflammatory response syndrome (SIRS) of non-infectious origin without acute organ dysfunction: Secondary | ICD-10-CM

## 2022-06-29 DIAGNOSIS — C787 Secondary malignant neoplasm of liver and intrahepatic bile duct: Secondary | ICD-10-CM | POA: Diagnosis present

## 2022-06-29 DIAGNOSIS — C3492 Malignant neoplasm of unspecified part of left bronchus or lung: Secondary | ICD-10-CM

## 2022-06-29 DIAGNOSIS — G039 Meningitis, unspecified: Secondary | ICD-10-CM | POA: Diagnosis present

## 2022-06-29 DIAGNOSIS — Z7401 Bed confinement status: Secondary | ICD-10-CM | POA: Diagnosis not present

## 2022-06-29 LAB — CSF CELL COUNT WITH DIFFERENTIAL
Eosinophils, CSF: 0 %
Eosinophils, CSF: 0 %
Lymphs, CSF: 6 %
Lymphs, CSF: 9 %
Monocyte-Macrophage-Spinal Fluid: 12 %
Monocyte-Macrophage-Spinal Fluid: 9 %
RBC Count, CSF: 3615 /mm3 — ABNORMAL HIGH (ref 0–3)
RBC Count, CSF: 799 /mm3 — ABNORMAL HIGH (ref 0–3)
Segmented Neutrophils-CSF: 79 %
Segmented Neutrophils-CSF: 85 %
Tube #: 1
Tube #: 4
WBC, CSF: 121 /mm3 (ref 0–5)
WBC, CSF: 158 /mm3 (ref 0–5)

## 2022-06-29 LAB — CBC WITH DIFFERENTIAL/PLATELET
Abs Immature Granulocytes: 0.07 10*3/uL (ref 0.00–0.07)
Basophils Absolute: 0 10*3/uL (ref 0.0–0.1)
Basophils Relative: 0 %
Eosinophils Absolute: 0.1 10*3/uL (ref 0.0–0.5)
Eosinophils Relative: 1 %
HCT: 42.8 % (ref 39.0–52.0)
Hemoglobin: 14.1 g/dL (ref 13.0–17.0)
Immature Granulocytes: 1 %
Lymphocytes Relative: 6 %
Lymphs Abs: 0.9 10*3/uL (ref 0.7–4.0)
MCH: 31.3 pg (ref 26.0–34.0)
MCHC: 32.9 g/dL (ref 30.0–36.0)
MCV: 94.9 fL (ref 80.0–100.0)
Monocytes Absolute: 0.8 10*3/uL (ref 0.1–1.0)
Monocytes Relative: 6 %
Neutro Abs: 13.2 10*3/uL — ABNORMAL HIGH (ref 1.7–7.7)
Neutrophils Relative %: 86 %
Platelets: 141 10*3/uL — ABNORMAL LOW (ref 150–400)
RBC: 4.51 MIL/uL (ref 4.22–5.81)
RDW: 13.1 % (ref 11.5–15.5)
WBC: 15.1 10*3/uL — ABNORMAL HIGH (ref 4.0–10.5)
nRBC: 0 % (ref 0.0–0.2)

## 2022-06-29 LAB — COMPREHENSIVE METABOLIC PANEL
ALT: 39 U/L (ref 0–44)
AST: 27 U/L (ref 15–41)
Albumin: 3.9 g/dL (ref 3.5–5.0)
Alkaline Phosphatase: 59 U/L (ref 38–126)
Anion gap: 11 (ref 5–15)
BUN: 24 mg/dL — ABNORMAL HIGH (ref 6–20)
CO2: 20 mmol/L — ABNORMAL LOW (ref 22–32)
Calcium: 8.4 mg/dL — ABNORMAL LOW (ref 8.9–10.3)
Chloride: 104 mmol/L (ref 98–111)
Creatinine, Ser: 0.99 mg/dL (ref 0.61–1.24)
GFR, Estimated: >60 mL/min (ref >60)
Glucose, Bld: 125 mg/dL — ABNORMAL HIGH (ref 70–99)
Potassium: 3.6 mmol/L (ref 3.5–5.1)
Sodium: 135 mmol/L (ref 135–145)
Total Bilirubin: 1.8 mg/dL — ABNORMAL HIGH (ref 0.3–1.2)
Total Protein: 6.7 g/dL (ref 6.5–8.1)

## 2022-06-29 LAB — MENINGITIS/ENCEPHALITIS PANEL (CSF)

## 2022-06-29 LAB — URINALYSIS, COMPLETE (UACMP) WITH MICROSCOPIC
Bacteria, UA: NONE SEEN
Bilirubin Urine: NEGATIVE
Glucose, UA: NEGATIVE mg/dL
Ketones, ur: 80 mg/dL — AB
Leukocytes,Ua: NEGATIVE
Nitrite: NEGATIVE
Protein, ur: 30 mg/dL — AB
Specific Gravity, Urine: 1.021 (ref 1.005–1.030)
Squamous Epithelial / HPF: NONE SEEN (ref 0–5)
pH: 5 (ref 5.0–8.0)

## 2022-06-29 LAB — AMMONIA: Ammonia: 11 umol/L (ref 9–35)

## 2022-06-29 LAB — APTT: aPTT: 25 seconds (ref 24–36)

## 2022-06-29 LAB — PROTIME-INR
INR: 1.1 (ref 0.8–1.2)
Prothrombin Time: 14.1 seconds (ref 11.4–15.2)

## 2022-06-29 LAB — LACTIC ACID, PLASMA
Lactic Acid, Venous: 4 mmol/L (ref 0.5–1.9)
Lactic Acid, Venous: 1.8 mmol/L (ref 0.5–1.9)

## 2022-06-29 LAB — PROCALCITONIN: Procalcitonin: <0.1 ng/mL

## 2022-06-29 LAB — PROTEIN AND GLUCOSE, CSF
Glucose, CSF: <20 mg/dL — CL (ref 40–70)
Total  Protein, CSF: 131 mg/dL — ABNORMAL HIGH (ref 15–45)

## 2022-06-29 LAB — PATHOLOGIST SMEAR REVIEW

## 2022-06-29 LAB — SARS CORONAVIRUS 2 BY RT PCR: SARS Coronavirus 2 by RT PCR: NEGATIVE

## 2022-06-29 MED ORDER — LEVETIRACETAM IN NACL 500 MG/100ML IV SOLN
500.0000 mg | Freq: Two times a day (BID) | INTRAVENOUS | Status: DC
Start: 2022-06-29 — End: 2022-07-01
  Administered 2022-06-29 – 2022-07-01 (×4): 500 mg via INTRAVENOUS
  Filled 2022-06-29 (×6): qty 100

## 2022-06-29 MED ORDER — ROSUVASTATIN CALCIUM 5 MG PO TABS
5.0000 mg | ORAL_TABLET | Freq: Every day | ORAL | Status: DC
  Administered 2022-06-30 – 2022-07-01 (×2): 5 mg via ORAL
  Filled 2022-06-29 (×2): qty 1

## 2022-06-29 MED ORDER — SODIUM CHLORIDE 0.9 % IV SOLN
2.0000 g | INTRAVENOUS | Status: DC
  Administered 2022-06-29 (×2): 2 g via INTRAVENOUS
  Filled 2022-06-29 (×5): qty 2000

## 2022-06-29 MED ORDER — NICOTINE 21 MG/24HR TD PT24
21.0000 mg | MEDICATED_PATCH | Freq: Every day | TRANSDERMAL | Status: DC
  Filled 2022-06-29 (×2): qty 1

## 2022-06-29 MED ORDER — SODIUM CHLORIDE 0.9 % IV BOLUS
500.0000 mL | Freq: Once | INTRAVENOUS | Status: AC
  Administered 2022-06-29: 500 mL via INTRAVENOUS

## 2022-06-29 MED ORDER — ALBUTEROL SULFATE (2.5 MG/3ML) 0.083% IN NEBU: 2.5000 mg | INHALATION_SOLUTION | RESPIRATORY_TRACT | Status: DC | PRN

## 2022-06-29 MED ORDER — LEVETIRACETAM IN NACL 1500 MG/100ML IV SOLN
1500.0000 mg | Freq: Once | INTRAVENOUS | Status: AC
  Administered 2022-06-29: 1500 mg via INTRAVENOUS
  Filled 2022-06-29: qty 100

## 2022-06-29 MED ORDER — VANCOMYCIN HCL 750 MG/150ML IV SOLN
750.0000 mg | Freq: Three times a day (TID) | INTRAVENOUS | Status: DC
  Filled 2022-06-29 (×2): qty 150

## 2022-06-29 MED ORDER — OXYCODONE-ACETAMINOPHEN 10-325 MG PO TABS
1.0000 | ORAL_TABLET | Freq: Four times a day (QID) | ORAL | Status: DC | PRN
Start: 2022-06-29 — End: 2022-06-29

## 2022-06-29 MED ORDER — LORAZEPAM 2 MG/ML IJ SOLN
1.0000 mg | Freq: Once | INTRAMUSCULAR | Status: AC
  Administered 2022-06-29: 1 mg via INTRAVENOUS
  Filled 2022-06-29: qty 1

## 2022-06-29 MED ORDER — SODIUM CHLORIDE 0.9 % IV SOLN
2.0000 g | Freq: Two times a day (BID) | INTRAVENOUS | Status: DC
  Administered 2022-06-29: 2 g via INTRAVENOUS
  Filled 2022-06-29: qty 20

## 2022-06-29 MED ORDER — VANCOMYCIN HCL 1750 MG/350ML IV SOLN
1750.0000 mg | Freq: Once | INTRAVENOUS | Status: AC
  Administered 2022-06-29: 1750 mg via INTRAVENOUS
  Filled 2022-06-29: qty 350

## 2022-06-29 MED ORDER — OXYCODONE HCL 5 MG PO TABS: 5.0000 mg | ORAL_TABLET | Freq: Four times a day (QID) | ORAL | Status: DC | PRN

## 2022-06-29 MED ORDER — MORPHINE SULFATE (PF) 2 MG/ML IV SOLN
1.0000 mg | INTRAVENOUS | Status: DC | PRN
  Administered 2022-06-29 – 2022-07-01 (×2): 1 mg via INTRAVENOUS
  Filled 2022-06-29 (×2): qty 1

## 2022-06-29 MED ORDER — PANTOPRAZOLE SODIUM 40 MG PO TBEC
40.0000 mg | DELAYED_RELEASE_TABLET | Freq: Two times a day (BID) | ORAL | Status: DC
  Administered 2022-06-30 – 2022-07-01 (×4): 40 mg via ORAL
  Filled 2022-06-29 (×4): qty 1

## 2022-06-29 MED ORDER — DM-GUAIFENESIN ER 30-600 MG PO TB12: 1.0000 | ORAL_TABLET | Freq: Two times a day (BID) | ORAL | Status: DC | PRN

## 2022-06-29 MED ORDER — LORAZEPAM 2 MG/ML IJ SOLN
2.0000 mg | INTRAMUSCULAR | Status: DC | PRN
  Administered 2022-07-01: 2 mg via INTRAVENOUS
  Filled 2022-06-29 (×2): qty 1

## 2022-06-29 MED ORDER — DEXAMETHASONE SODIUM PHOSPHATE 10 MG/ML IJ SOLN
10.0000 mg | Freq: Once | INTRAMUSCULAR | Status: AC
  Administered 2022-06-29: 10 mg via INTRAVENOUS
  Filled 2022-06-29: qty 1

## 2022-06-29 MED ORDER — ACYCLOVIR SODIUM 50 MG/ML IV SOLN
10.0000 mg/kg | Freq: Three times a day (TID) | INTRAVENOUS | Status: DC
Start: 2022-06-29 — End: 2022-06-29
  Administered 2022-06-29: 660 mg via INTRAVENOUS
  Filled 2022-06-29 (×2): qty 13.2

## 2022-06-29 MED ORDER — ENOXAPARIN SODIUM 40 MG/0.4ML IJ SOSY: 40.0000 mg | PREFILLED_SYRINGE | INTRAMUSCULAR | Status: DC

## 2022-06-29 MED ORDER — DEXAMETHASONE SODIUM PHOSPHATE 10 MG/ML IJ SOLN
10.0000 mg | Freq: Four times a day (QID) | INTRAMUSCULAR | Status: DC
  Administered 2022-06-29 – 2022-06-30 (×3): 10 mg via INTRAVENOUS
  Filled 2022-06-29 (×3): qty 1

## 2022-06-29 MED ORDER — LACTATED RINGERS IV BOLUS
1000.0000 mL | Freq: Once | INTRAVENOUS | Status: AC
  Administered 2022-06-29: 1000 mL via INTRAVENOUS

## 2022-06-29 MED ORDER — OXYCODONE-ACETAMINOPHEN 5-325 MG PO TABS: 1.0000 | ORAL_TABLET | Freq: Four times a day (QID) | ORAL | Status: DC | PRN

## 2022-06-29 MED ORDER — SODIUM CHLORIDE 0.9 % IV SOLN: INTRAVENOUS | Status: DC

## 2022-06-29 MED ORDER — SODIUM CHLORIDE 0.9 % IV SOLN
2.0000 g | Freq: Three times a day (TID) | INTRAVENOUS | Status: DC
  Filled 2022-06-29: qty 12.5

## 2022-06-29 NOTE — Assessment & Plan Note (Addendum)
Patient seems to have responded to immunotherapy, unfortunately immunotherapy had to be held due to intolerance.  Patient is currently taking prednisone.   MRI with [place] metastasis to brain. Unfortunately he is not a candidate for any further treatment. Palliative care was consulted and patient and family decided to proceed with hospice

## 2022-06-29 NOTE — Assessment & Plan Note (Signed)
[facility]

## 2022-06-29 NOTE — ED Notes (Signed)
Pt is laying on side not acknowledging being spoken to. Pt is A&OX0.  Pt is moving around in bed with family members covering him up stating he calms down when he is covered up. There are 3 visitors at bedside. One is asking about ativan for agitation, but daughter is correcting this person saying providers don't want him overly sedated to see if this AMS will clear. Pt is ordered to have an MRI, but is unable to complete at this time due to agitation. Pt is wearing one mitten on right hand and has coban wrapped around one IV in left arm. Put will not leave ECG or purewick in place. SpO2 and BP cuff are on right leg.

## 2022-06-29 NOTE — ED Triage Notes (Signed)
To room 12 via [facility] with c/o unresponsive, possible seizure activity per family. No seizure activity witnessed by EMS, Pt intermittently responsive to voice  500ML NS  18G R FA CBG 138

## 2022-06-29 NOTE — Consult Note (Signed)
[place] at [facility] Telephone:(336) [phone] [phone]) [phone]   Name: Jose Morse Date: 06/29/2022 MRN: [id number]  DOB: Feb 24, 1971  Patient Care Team: Tracie Harrier, MD as PCP - General (Internal Medicine) Telford Nab, RN as Oncology Nurse Navigator Cammie Sickle, MD as Consulting Physician (Oncology)    REASON FOR CONSULTATION: Jose Morse is a 51 y.o. 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  Patient has been on treatment with carbo/etoposide/atezo but immunotherapy recently on hold due to joint pains.  CT April 17, 2022 showed significant response to therapy.  Patient was admitted to the hospital 06/29/2022 with seizures.  LP CSF positive for malignant cells.  Palliative care was consulted to help address goals.  SOCIAL HISTORY:     reports that he has been smoking cigarettes. He has a 17.00 pack-year smoking history. He has never used smokeless tobacco. He reports that he does not currently use drugs after having used the following drugs: Marijuana. He reports that he does not drink alcohol.  Patient is married lives at home with his wife and son.  He also has a daughter who is involved in his care.  ADVANCE DIRECTIVES:  Not on file  CODE STATUS: DNR  PAST MEDICAL HISTORY: Past Medical History:  Diagnosis Date   History of kidney stones    Hypercholesteremia     PAST SURGICAL HISTORY:  Past Surgical History:  Procedure Laterality Date   CHOLECYSTECTOMY     IR IMAGING GUIDED PORT INSERTION  02/23/2022   VIDEO BRONCHOSCOPY WITH ENDOBRONCHIAL NAVIGATION N/A 02/10/2022   Procedure: VIDEO BRONCHOSCOPY WITH ENDOBRONCHIAL NAVIGATION;  Surgeon: Ottie Glazier, MD;  Location: [facility] ORS;  Service: Thoracic;  Laterality: N/A;   VIDEO BRONCHOSCOPY WITH ENDOBRONCHIAL ULTRASOUND N/A 02/10/2022    Procedure: VIDEO BRONCHOSCOPY WITH ENDOBRONCHIAL ULTRASOUND;  Surgeon: Ottie Glazier, MD;  Location: [facility] ORS;  Service: Thoracic;  Laterality: N/A;    HEMATOLOGY/ONCOLOGY HISTORY:  Oncology History Overview Note  # . LUNG, LEFT UPPER LOBE; ENB-GUIDED TRANSBRONCHIAL FORCEPS BIOPSY: - SMALL CELL CARCINOMA.  [Dr.Aleskerov]  Comment:  Invasive carcinoma is present in two fragments, with crush artifact. The  cells are small, with scant cytoplasm, nuclear molding, and  inconspicuous nucleoli. There is necrosis.   Immunohistochemistry (IHC) was performed for further characterization.  The neoplastic cells are positive for CD56 with strong staining of over  90% of cells. They are negative for TTF-1 and p40.    IMPRESSION: 1. LEFT suprahilar mass constricting LEFT upper lobe bronchus consistent with primary bronchogenic carcinoma. 2. Ipsilateral mediastinal nodal metastasis. 3. Distant visceral metastasis to the LIVER and bilateral ADRENAL GLANDS. 4. Distant nodal metastasis to the LEFT external iliac node. 5. Multifocal hypermetabolic skeletal metastasis (approximately 40 lesions). Lesions are occult by CT imaging. 6. Focal activity beneath the skin posterior to the RIGHT ear. Differential includes nodal metastasis versus primary parotid neoplasm. Favor nodal metastasis. (Recommend attention on brain MRI workup).     Electronically Signed   By: Suzy Bouchard M.D.   On: 02/06/2022 13:00  # April 2023-brain MRI incidental/screening 4 mm x 2 cerebellar lesions.  # MAY 1st, 2023- carboo-Etop [d-1-3]; udenyca   Primary cancer of left upper lobe of lung ([place])  02/16/2022 Initial Diagnosis   Primary cancer of left upper lobe of lung ([place])   02/16/2022 Cancer Staging   Staging form: Lung, AJCC 8th Edition -  Clinical: Stage IVB (cT3, cN2, pM1c) - Signed by Cammie Sickle, MD on 02/16/2022 Stage prefix: Initial diagnosis   02/20/2022 - 05/15/2022 Chemotherapy   Patient is on  Treatment Plan : LUNG SCLC Carboplatin + Etoposide + Atezolizumab Induction q21d / Atezolizumab Maintenance q21d     02/20/2022 -  Chemotherapy   Patient is on Treatment Plan : LUNG SCLC Carboplatin + Etoposide + Atezolizumab Induction q21d x 4 cycles / Atezolizumab Maintenance q21d       ALLERGIES:  is allergic to codeine.  MEDICATIONS:  Current Facility-Administered Medications  Medication Dose Route Frequency Provider Last Rate Last Admin   0.9 %  sodium chloride infusion   Intravenous Continuous Ivor Costa, MD 125 mL/hr at 06/29/22 1728 New Bag at 06/29/22 1728   acyclovir (ZOVIRAX) 660 mg in dextrose 5 % 100 mL IVPB  10 mg/kg Intravenous Q8H Ivor Costa, MD   Stopped at 06/29/22 1422   albuterol (PROVENTIL) (2.5 MG/3ML) 0.083% nebulizer solution 2.5 mg  2.5 mg Nebulization Q4H PRN Ivor Costa, MD       ampicillin (OMNIPEN) 2 g in sodium chloride 0.9 % 100 mL IVPB  2 g Intravenous Q4H Ivor Costa, MD   Stopped at 06/29/22 1302   cefTRIAXone (ROCEPHIN) 2 g in sodium chloride 0.9 % 100 mL IVPB  2 g Intravenous Q12H Amie Portland, MD       dexamethasone (DECADRON) injection 10 mg  10 mg Intravenous Q6H Amie Portland, MD   10 mg at 06/29/22 1717   dextromethorphan-guaiFENesin ([place] DM) 30-600 MG per 12 hr tablet 1 tablet  1 tablet Oral BID PRN Ivor Costa, MD       levETIRAcetam (KEPPRA) IVPB 500 mg/100 mL premix  500 mg Intravenous Q12H Ivor Costa, MD 400 mL/hr at 06/29/22 1718 500 mg at 06/29/22 1718   LORazepam (ATIVAN) injection 2 mg  2 mg Intravenous Q2H PRN Ivor Costa, MD       nicotine (NICODERM CQ - dosed in mg/24 hours) patch 21 mg  21 mg Transdermal Daily Ivor Costa, MD   21 mg at 06/29/22 1024   oxyCODONE-acetaminophen (PERCOCET/ROXICET) 5-325 MG per tablet 1 tablet  1 tablet Oral Q6H PRN Noralee Space, RPH       And   oxyCODONE (Oxy IR/ROXICODONE) immediate release tablet 5 mg  5 mg Oral Q6H PRN Chinita Greenland A, RPH       pantoprazole (PROTONIX) EC tablet 40 mg  40 mg Oral  BID Ivor Costa, MD       [START ON 06/30/2022] rosuvastatin (CRESTOR) tablet 5 mg  5 mg Oral Daily Ivor Costa, MD       vancomycin (VANCOREADY) IVPB 750 mg/150 mL  750 mg Intravenous Q8H Ivor Costa, MD       Current Outpatient Medications  Medication Sig Dispense Refill   chlorproMAZINE (THORAZINE) 25 MG tablet Take 1 tablet (25 mg total) by mouth 3 (three) times daily. 45 tablet 0   metoCLOPramide (REGLAN) 10 MG tablet Take 1 tablet (10 mg total) by mouth 4 (four) times daily -  before meals and at bedtime. 60 tablet 0   pantoprazole (PROTONIX) 40 MG tablet Take 1 tablet (40 mg total) by mouth 2 (two) times daily. 1 hour prior to breakfast/dinner. 60 each 1   potassium chloride SA (KLOR-CON M20) 20 MEQ tablet 1 TABLET TWICE A DAY 60 tablet 1   predniSONE (DELTASONE) 20 MG tablet Take 1 tablet (20 mg total) by mouth daily with breakfast. Take half  a pill a day for 1 week; and then half a pill every other day; and then stop.  Take it in the morning with food. 10 tablet 0   rosuvastatin (CRESTOR) 5 MG tablet Take 5 mg by mouth at bedtime.     albuterol (PROVENTIL HFA) 108 (90 Base) MCG/ACT inhaler Inhale 2 puffs into the lungs every 4 (four) hours as needed for wheezing or shortness of breath. 1 each 0   baclofen (LIORESAL) 10 MG tablet Take 0.5-1 tablets (5-10 mg total) by mouth 3 (three) times daily. (Patient not taking: Reported on 07/02/2022) 10 each 0   Budeson-Glycopyrrol-Formoterol (BREZTRI AEROSPHERE) 160-9-4.8 MCG/ACT AERO Inhale 2 puffs into the lungs in the morning and at bedtime. (Patient not taking: Reported on Jul 02, 2022)     lidocaine-prilocaine (EMLA) cream Apply on the port. 30 -45 min  prior to port access. (Patient not taking: Reported on 07/02/2022) 30 g 3   ondansetron (ZOFRAN) 8 MG tablet One pill every 8 hours as needed for nausea/vomitting. (Patient not taking: Reported on Jul 02, 2022) 40 tablet 1   ondansetron (ZOFRAN-ODT) 8 MG disintegrating tablet Take 1 tablet (8 mg total) by mouth  every 8 (eight) hours as needed for nausea or vomiting. (Patient not taking: Reported on 07/02/2022) 45 tablet 0   oxyCODONE-acetaminophen (PERCOCET) 10-325 MG tablet Take 1 tablet by mouth every 6 (six) hours as needed for pain. 60 tablet 0   Facility-Administered Medications Ordered in Other Encounters  Medication Dose Route Frequency Provider Last Rate Last Admin   dexamethasone (DECADRON) 10 MG/ML injection            heparin lock flush 100 UNIT/ML injection             VITAL SIGNS: BP (!) 122/59   Pulse 71   Temp 98.8 F (37.1 C) (Oral)   Resp (!) 21   Ht _0  (1.702 m)   Wt 145 lb (65.8 kg)   SpO2 99%   BMI 22.71 kg/m  Filed Weights   07-02-2022 0349  Weight: 145 lb (65.8 kg)    Estimated body mass index is 22.71 kg/m as calculated from the following:   Height as of this encounter: _1  (1.702 m).   Weight as of this encounter: 145 lb (65.8 kg).  LABS: CBC:    Component Value Date/Time   WBC 15.1 (H) 02-Jul-2022 0358   HGB 14.1 Jul 02, 2022 0358   HCT 42.8 02-Jul-2022 0358   PLT 141 (L) Jul 02, 2022 0358   MCV 94.9 2022-07-02 0358   NEUTROABS 13.2 (H) 07-02-22 0358   LYMPHSABS 0.9 Jul 02, 2022 0358   MONOABS 0.8 2022/07/02 0358   EOSABS 0.1 2022-07-02 0358   BASOSABS 0.0 2022-07-02 0358   Comprehensive Metabolic Panel:    Component Value Date/Time   NA 135 07-02-22 0358   K 3.6 July 02, 2022 0358   CL 104 07-02-22 0358   CO2 20 (L) 2022-07-02 0358   BUN 24 (H) 07-02-2022 0358   CREATININE 0.99 2022-07-02 0358   GLUCOSE 125 (H) 2022/07/02 0358   CALCIUM 8.4 (L) 02-Jul-2022 0358   AST 27 07/02/2022 0358   ALT 39 07/02/2022 0358   ALKPHOS 59 2022-07-02 0358   BILITOT 1.8 (H) 07/02/22 0358   PROT 6.7 07-02-2022 0358   ALBUMIN 3.9 07/02/2022 0358    RADIOGRAPHIC STUDIES: EEG adult  Result Date: 2022-07-02 Lora Havens, MD     07/02/22  1:00 PM Patient Name: LORAIN KEAST MRN: [id number] Epilepsy Attending: Lora Havens Referring  Physician/Provider: Amie Portland, MD Date: 06/29/2022 Duration: 21.53 mins Patient history: 51 year old man with metastatic small cell lung cancer to the bone and brain, with new onset seizure. EEG to evaluate for seizure. Level of alertness: Awake, asleep AEDs during EEG study: LEV Technical aspects: This EEG study was done with scalp electrodes positioned according to the 10-20 International system of electrode placement. Electrical activity was reviewed with band pass filter of 1-_0 , sensitivity of 7 uV/mm, display speed of 21m/sec with a _1  notched filter applied as appropriate. EEG data were recorded continuously and digitally stored.  Video monitoring was available and reviewed as appropriate. Description: The posterior dominant rhythm consists of 8 Hz activity of moderate voltage (25-35 uV) seen predominantly in posterior head regions, symmetric and reactive to eye opening and eye closing. Sleep was characterized by vertex waves, sleep spindles (12 to 14 Hz), maximal frontocentral region. Intermittent sharply contoured 2 to 3 Hz delta slowing as well as spikes  were noted in right parieto-occipital region. Hyperventilation and photic stimulation were not performed.   ABNORMALITY -Spike, right parieto-occipital region -Intermittent slow, right right parieto-occipital region IMPRESSION: This study showed evidence of epileptogenicity and cortical dysfunction arising from right parieto-occipital region. No seizures were seen throughout the recording. Priyanka OBarbra Sarks  CT HEAD WO CONTRAST (5MM)  Result Date: 06/29/2022 CLINICAL DATA:  Possible seizure activity per family. Lung cancer patient with previous metastases to the brain. EXAM: CT HEAD WITHOUT CONTRAST TECHNIQUE: Contiguous axial images were obtained from the base of the skull through the vertex without intravenous contrast. RADIATION DOSE REDUCTION: This exam was performed according to the departmental dose-optimization program which includes  automated exposure control, adjustment of the mA and/or kV according to patient size and/or use of iterative reconstruction technique. COMPARISON:  MRI brain 04/17/2022 FINDINGS: Technical note: The images are grainy due to beam hardening related to right lateral decubitus positioning of the head on exam. Brain: No evidence of acute infarction, hemorrhage, hydrocephalus, extra-axial collection or mass lesion/mass effect. Vascular: No hyperdense vessel or unexpected calcification. Skull: Negative for fracture or visible lesion. MR brain demonstrated a likely calvarial small metastasis in the posterior right parietal high convexity, but there is no corresponding CT finding. Sinuses/Orbits: There is chronic membrane thickening in the ethmoids and maxillary sinuses, retention cysts in the right maxillary sinus and sphenoid sinus. Rest of the sinuses and mastoid air cells are clear. Unremarkable orbital contents. Other: None. IMPRESSION: Grainy images due to positioning of the patient. No convincing acute intracranial CT abnormality or interval changes. 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 Electronically Signed   By: KTelford NabM.D.   On: 06/29/2022 06:22   DG Chest Port 1 View  Result Date: 06/29/2022 CLINICAL DATA:  Sepsis. EXAM: PORTABLE CHEST 1 VIEW COMPARISON:  02/10/2022 FINDINGS: There is a right chest wall port a catheter with tip at the cavoatrial junction. Heart size and mediastinal contours are unremarkable. No pleural effusion or edema identified. IMPRESSION: No active disease. Electronically Signed   By: TKerby MoorsM.D.   On: 06/29/2022 04:55    PERFORMANCE STATUS (ECOG) : 4 - Bedbound  Review of Systems Unable to complete  Physical Exam General: Hearing Pulmonary: Unlabored Extremities: no edema, no joint deformities Skin: no rashes Neurological: Poorly responsive  IMPRESSION: Patient  seen twice today in the ED.  He is poorly responsive and has received IV lorazepam for seizures earlier today.  Patient has been  seen in consultation with neurology and is status post a EEG.  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  Unfortunately, patient is not felt to have any additional viable treatment options and hospice has been recommended.  I met with patient's wife, son, and daughter.  Wife and daughter are interested in patient transferring to inpatient hospice facility for comfort measures.  We will ask that the hospice liaison coordinate.  We discussed CODE STATUS and family in agreement with DNR/DNI.  PLAN: -Transfer to hospice Home when a bed is available -DNR/DNI  Case and plan discussed with Dr. Rogue Bussing  Time Total: 70 minutes  Visit consisted of counseling and education dealing with the complex and emotionally intense issues of symptom management and palliative care in the setting of serious and potentially life-threatening illness.Greater than 50%  of this time was spent counseling and coordinating care related to the above assessment and plan.  Signed by: Altha Harm, PhD, NP-C

## 2022-06-29 NOTE — Consult Note (Addendum)
NAME: Jose Morse  DOB: 1971/04/21  MRN: [id number]  Date/Time: 06/29/2022 3:00 PM  REQUESTING PROVIDERDr.Niu Subjective:  REASON FOR CONSULT: meningitis ?No history from patient- daughter at bedside [name] is a 51 y.o. 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 He was noted to have seizures at 2-3 am this morning. He has been agitated and confused the past 2 days. As per daughter no fever, or sob, chest pain, diarrhea, abdominal pain Pt received tecentriq and zometa on 06/28/22   06/28/22 08:24  BP 141/105 (H)  Temp 95 F (35 C) !  Pulse Rate 82  SpO2 100 %    Latest Reference Range & Units 06/28/22 08:15  WBC 4.0 - 10.5 K/uL 12.0 (H)  Hemoglobin 13.0 - 17.0 g/dL 15.7  HCT 39.0 - 52.0 % 48.1  Platelets 150 - 400 K/uL 162  Creatinine 0.61 - 1.24 mg/dL 0.94    CT head nothing abnormal LP done and it showed  Latest Reference Range & Units 06/29/22 11:00  Appearance, CSF CLEAR  CLEAR  HAZY ! HAZY !  Glucose, CSF 40 - 70 mg/dL <20 (LL)  RBC Count, CSF 0 - 3 /cu mm 0 - 3 /cu mm 799 (H) 3,615 (H)  WBC, CSF 0 - 5 /cu mm 0 - 5 /cu mm 158 (HH) 121 (HH)  Segmented Neutrophils-CSF % % 79 85  Lymphs, CSF % % 9 6  Monocyte-Macrophage-Spinal Fluid % % 12 9  Eosinophils, CSF % % 0 0  Color, CSF COLORLESS  COLORLESS  PINK ! PINK !  Supernatant  CLEAR CLEAR  Total  Protein, CSF 15 - 45 mg/dL 131 (H)  Tube #  4 1   I am asked to see patient to rule out meningitis Pt has been seen by neurologist Pathologist reviewed the csf and found rare malignant cells Past Medical History:  Diagnosis Date   History of kidney stones    Hypercholesteremia     Past Surgical History:  Procedure Laterality Date   CHOLECYSTECTOMY     IR IMAGING GUIDED PORT INSERTION  02/23/2022   VIDEO BRONCHOSCOPY WITH ENDOBRONCHIAL NAVIGATION N/A 02/10/2022    Procedure: VIDEO BRONCHOSCOPY WITH ENDOBRONCHIAL NAVIGATION;  Surgeon: Ottie Glazier, MD;  Location: [facility];  Service: Thoracic;  Laterality: N/A;   VIDEO BRONCHOSCOPY WITH ENDOBRONCHIAL ULTRASOUND N/A 02/10/2022   Procedure: VIDEO BRONCHOSCOPY WITH ENDOBRONCHIAL ULTRASOUND;  Surgeon: Ottie Glazier, MD;  Location: [facility];  Service: Thoracic;  Laterality: N/A;    Social History   Socioeconomic History   Marital status: Married    Spouse name: Not on file   Number of children: Not on file   Years of education: Not on file   Highest education level: Not on file  Occupational History   Not on file  Tobacco Use   Smoking status: Every Day    Packs/day: 0.50    Years: 34.00    Total pack years: 17.00    Types: Cigarettes   Smokeless tobacco: Never  Vaping Use   Vaping Use: Never used  Substance and Sexual Activity   Alcohol use: No   Drug use: Not Currently    Types: Marijuana   Sexual activity: Yes  Other Topics Concern   Not on file  Social History Narrative   Lives in [place]; with wife/son- 6 y [2023]; works in [employer]. Smoker; no alcohol.  Social Determinants of Health   Financial Resource Strain: Not on file  Food Insecurity: Not on file  Transportation Needs: Not on file  Physical Activity: Not on file  Stress: Not on file  Social Connections: Not on file  Intimate Partner Violence: Not on file    Family History  Problem Relation Age of Onset   Ovarian cancer Mother    Colon cancer Father    Diabetes Father    Allergies  Allergen Reactions   Codeine Nausea Only   I? Current Facility-Administered Medications  Medication Dose Route Frequency Provider Last Rate Last Admin   0.9 %  sodium chloride infusion   Intravenous Continuous Ivor Costa, MD 75 mL/hr at 06/29/22 1025 New Bag at 06/29/22 1025   acyclovir (ZOVIRAX) 660 mg in dextrose 5 % 100 mL IVPB  10 mg/kg Intravenous Q8H Ivor Costa, MD   Stopped at 06/29/22 1422   albuterol (PROVENTIL) (2.5  MG/3ML) 0.083% nebulizer solution 2.5 mg  2.5 mg Nebulization Q4H PRN Ivor Costa, MD       ampicillin (OMNIPEN) 2 g in sodium chloride 0.9 % 100 mL IVPB  2 g Intravenous Q4H Ivor Costa, MD   Stopped at 06/29/22 1302   cefTRIAXone (ROCEPHIN) 2 g in sodium chloride 0.9 % 100 mL IVPB  2 g Intravenous Q12H Amie Portland, MD       dexamethasone (DECADRON) injection 10 mg  10 mg Intravenous Q6H Amie Portland, MD       dextromethorphan-guaiFENesin ([place] DM) 30-600 MG per 12 hr tablet 1 tablet  1 tablet Oral BID PRN Ivor Costa, MD       levETIRAcetam (KEPPRA) IVPB 500 mg/100 mL premix  500 mg Intravenous Q12H Ivor Costa, MD       LORazepam (ATIVAN) injection 2 mg  2 mg Intravenous Q2H PRN Ivor Costa, MD       nicotine (NICODERM CQ - dosed in mg/24 hours) patch 21 mg  21 mg Transdermal Daily Ivor Costa, MD   21 mg at 06/29/22 1024   vancomycin (VANCOREADY) IVPB 1750 mg/350 mL  1,750 mg Intravenous Once Ivor Costa, MD 175 mL/hr at 06/29/22 1424 1,750 mg at 06/29/22 1424   vancomycin (VANCOREADY) IVPB 750 mg/150 mL  750 mg Intravenous Q8H Ivor Costa, MD       Current Outpatient Medications  Medication Sig Dispense Refill   chlorproMAZINE (THORAZINE) 25 MG tablet Take 1 tablet (25 mg total) by mouth 3 (three) times daily. 45 tablet 0   metoCLOPramide (REGLAN) 10 MG tablet Take 1 tablet (10 mg total) by mouth 4 (four) times daily -  before meals and at bedtime. 60 tablet 0   pantoprazole (PROTONIX) 40 MG tablet Take 1 tablet (40 mg total) by mouth 2 (two) times daily. 1 hour prior to breakfast/dinner. 60 each 1   potassium chloride SA (KLOR-CON M20) 20 MEQ tablet 1 TABLET TWICE A DAY 60 tablet 1   predniSONE (DELTASONE) 20 MG tablet Take 1 tablet (20 mg total) by mouth daily with breakfast. Take half a pill a day for 1 week; and then half a pill every other day; and then stop.  Take it in the morning with food. 10 tablet 0   rosuvastatin (CRESTOR) 5 MG tablet Take 5 mg by mouth at bedtime.     albuterol  (PROVENTIL HFA) 108 (90 Base) MCG/ACT inhaler Inhale 2 puffs into the lungs every 4 (four) hours as needed for wheezing or shortness of breath. 1 each 0   baclofen (  LIORESAL) 10 MG tablet Take 0.5-1 tablets (5-10 mg total) by mouth 3 (three) times daily. (Patient not taking: Reported on 06/29/2022) 10 each 0   Budeson-Glycopyrrol-Formoterol (BREZTRI AEROSPHERE) 160-9-4.8 MCG/ACT AERO Inhale 2 puffs into the lungs in the morning and at bedtime. (Patient not taking: Reported on 06/29/2022)     lidocaine-prilocaine (EMLA) cream Apply on the port. 30 -45 min  prior to port access. (Patient not taking: Reported on 06/29/2022) 30 g 3   ondansetron (ZOFRAN) 8 MG tablet One pill every 8 hours as needed for nausea/vomitting. (Patient not taking: Reported on 06/29/2022) 40 tablet 1   ondansetron (ZOFRAN-ODT) 8 MG disintegrating tablet Take 1 tablet (8 mg total) by mouth every 8 (eight) hours as needed for nausea or vomiting. (Patient not taking: Reported on 06/29/2022) 45 tablet 0   oxyCODONE-acetaminophen (PERCOCET) 10-325 MG tablet Take 1 tablet by mouth every 6 (six) hours as needed for pain. 60 tablet 0   Facility-Administered Medications Ordered in Other Encounters  Medication Dose Route Frequency Provider Last Rate Last Admin   dexamethasone (DECADRON) 10 MG/ML injection            heparin lock flush 100 UNIT/ML injection              Abtx:  Anti-infectives (From admission, onward)    Start     Dose/Rate Route Frequency Ordered Stop   06/29/22 2000  vancomycin (VANCOREADY) IVPB 750 mg/150 mL        750 mg 150 mL/hr over 60 Minutes Intravenous Every 8 hours 06/29/22 1031     06/29/22 1230  cefTRIAXone (ROCEPHIN) 2 g in sodium chloride 0.9 % 100 mL IVPB        2 g 200 mL/hr over 30 Minutes Intravenous Every 12 hours 06/29/22 1220     06/29/22 1200  acyclovir (ZOVIRAX) 660 mg in dextrose 5 % 100 mL IVPB        10 mg/kg  65.8 kg 113.2 mL/hr over 60 Minutes Intravenous Every 8 hours 06/29/22 1031      06/29/22 1200  ampicillin (OMNIPEN) 2 g in sodium chloride 0.9 % 100 mL IVPB        2 g 300 mL/hr over 20 Minutes Intravenous Every 4 hours 06/29/22 1040     06/29/22 1045  vancomycin (VANCOREADY) IVPB 1750 mg/350 mL        1,750 mg 175 mL/hr over 120 Minutes Intravenous  Once 06/29/22 1031     06/29/22 1045  ceFEPIme (MAXIPIME) 2 g in sodium chloride 0.9 % 100 mL IVPB  Status:  Discontinued        2 g 200 mL/hr over 30 Minutes Intravenous Every 8 hours 06/29/22 1031 06/29/22 1220       REVIEW OF SYSTEMS:  NA Pertinent Positives include : Objective:  VITALS:  BP 129/62   Pulse 70   Temp 98.8 F (37.1 C) (Oral)   Resp (!) 21   Ht 5\' 7"  (1.702 m)   Wt 65.8 kg   SpO2 94%   BMI 22.71 kg/m   Examination is limited as patient easily agitated and family want to keep him calm PHYSICAL EXAM:  General: lying on the side  Head: Normocephalic, without obvious abnormality, atraumatic. Eyes: cannot examine ENT cannot examine Neck: cannot examine no carotid bruit and no JVD. Back: did not examine Lungs: b/l air entry Heart:Tachycardia Abdomen: Soft, non-tender,not distended. Bowel sounds normal. No masses Extremities: atraumatic, no cyanosis. No edema. No clubbing Skin: incomplete examination Lymph: Cervical,  supraclavicular normal. Neurologic: moves all limbs spontaneously Pertinent Labs Lab Results CBC    Component Value Date/Time   WBC 15.1 (H) 06/29/2022 0358   RBC 4.51 06/29/2022 0358   HGB 14.1 06/29/2022 0358   HCT 42.8 06/29/2022 0358   PLT 141 (L) 06/29/2022 0358   MCV 94.9 06/29/2022 0358   MCH 31.3 06/29/2022 0358   MCHC 32.9 06/29/2022 0358   RDW 13.1 06/29/2022 0358   LYMPHSABS 0.9 06/29/2022 0358   MONOABS 0.8 06/29/2022 0358   EOSABS 0.1 06/29/2022 0358   BASOSABS 0.0 06/29/2022 0358       Latest Ref Rng & Units 06/29/2022    3:58 AM 06/28/2022    8:15 AM 06/23/2022    3:55 PM  CMP  Glucose 70 - 99 mg/dL 125  103  91   BUN 6 - 20 mg/dL 24  24  31     Creatinine 0.61 - 1.24 mg/dL 0.99  0.94  0.86   Sodium 135 - 145 mmol/L 135  138  139   Potassium 3.5 - 5.1 mmol/L 3.6  3.7  3.9   Chloride 98 - 111 mmol/L 104  103  102   CO2 22 - 32 mmol/L 20  28  24    Calcium 8.9 - 10.3 mg/dL 8.4  9.5  9.5   Total Protein 6.5 - 8.1 g/dL 6.7  7.7  7.9   Total Bilirubin 0.3 - 1.2 mg/dL 1.8  1.6  1.4   Alkaline Phos 38 - 126 U/L 59  62  69   AST 15 - 41 U/L 27  20  23    ALT 0 - 44 U/L 39  45  64       Microbiology: Recent Results (from the past 240 hour(s))  SARS Coronavirus 2 by RT PCR (hospital order, performed in [facility] hospital lab) *cepheid single result test* Anterior Nasal Swab     Status: None   Collection Time: 06/29/22  4:18 AM   Specimen: Anterior Nasal Swab  Result Value Ref Range Status   SARS Coronavirus 2 by RT PCR NEGATIVE NEGATIVE Final    Comment: (NOTE) SARS-CoV-2 target nucleic acids are NOT DETECTED.  The SARS-CoV-2 RNA is generally detectable in upper and lower respiratory specimens during the acute phase of infection. The lowest concentration of SARS-CoV-2 viral copies this assay can detect is 250 copies / mL. A negative result does not preclude SARS-CoV-2 infection and should not be used as the sole basis for treatment or other patient management decisions.  A negative result may occur with improper specimen collection / handling, submission of specimen other than nasopharyngeal swab, presence of viral mutation(s) within the areas targeted by this assay, and inadequate number of viral copies (<250 copies / mL). A negative result must be combined with clinical observations, patient history, and epidemiological information.  Fact Sheet for Patients:   https://www.patel.info/  Fact Sheet for Healthcare Providers: https://hall.com/  This test is not yet approved or  cleared by the [place] FDA and has been authorized for detection and/or diagnosis of SARS-CoV-2  by FDA under an Emergency Use Authorization (EUA).  This EUA will remain in effect (meaning this test can be used) for the duration of the COVID-19 declaration under Section 564(b)(1) of the Act, 21 U.S.C. section 360bbb-3(b)(1), unless the authorization is terminated or revoked sooner.  Performed at [facility], [address], [place], [place] [address]   Blood Culture (routine x 2)     Status: None (Preliminary  result)   Collection Time: 06/29/22  4:22 AM   Specimen: BLOOD  Result Value Ref Range Status   Specimen Description BLOOD RIGHT ASSIST CONTROL  Final   Special Requests   Final    BOTTLES DRAWN AEROBIC AND ANAEROBIC Blood Culture adequate volume   Culture   Final    NO GROWTH <12 HOURS Performed at [facility], [address] [place], [place] [address]    Report Status PENDING  Incomplete  CSF culture w Gram Stain     Status: None (Preliminary result)   Collection Time: 06/29/22 11:00 AM   Specimen: CSF; Cerebrospinal Fluid  Result Value Ref Range Status   Specimen Description CSF  Final   Special Requests Immunocompromised  Final   Gram Stain   Final    WBC SEEN RED BLOOD CELLS NO ORGANISMS SEEN Performed at [facility], [address], [place], [place] [address]    Culture PENDING  Incomplete   Report Status PENDING  Incomplete    IMAGING RESULTS:  I have personally reviewed the films ?no active disease  Impression/Recommendation ? 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 Due to immune check point inhibitor Until ME panel is back okay to continue antibiotics- will DC antibiotics if ME panel is negative  Looks like family is considering comfort care  Will  discuss with palliative regarding this ? ___________________________________________________ Discussed with daughter at bed side Note:  This document was prepared using Dragon voice recognition software and may include unintentional dictation errors.   Addendum - 7.38 pm- ME panel is negative- so DC all antibiotics

## 2022-06-29 NOTE — Assessment & Plan Note (Addendum)
Consulted Dr. Rory Percy of neurology.  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 Meningitis panel negative. EEG with epileptiform waves -Continue with Keppra -Seizure precaution -As needed Ativan for seizure - hold baclofen since it decreases the threshold of seizure

## 2022-06-29 NOTE — Consult Note (Addendum)
Pharmacy Antibiotic Note  Jose Morse is a 51 y.o. male admitted on 06/29/2022 with meningitis.  History of small cell lung carcinoma, stage IV with extensive mets.  receiving  immunotherapy. Pharmacy has been consulted for cefepime, vancomycin and acyclovir dosing. Pt presented for evaluation of altered mental status and seizure. LP with CSF to be collected. WBC trending up 12 > 15. Procal, < 0.1. ideally would like to collect LP prior to initiation of abx.   Plan: Will start ceftriaxone 2 g q12H.   Will give a vancomycin loading dose of 1750 mg x 1 followed by 750 mg q8H per [facility] vancomycin nomogram. Goal trough 15-20. Plan to obtain trough level prior to the 4th dose.   Will start acyclovir 10 mg/kg q8H. Ensure IV fluids are running. Currently pt is getting NS @ 125 ml/hr.   Will start ampicillin 2g q4H.    Height: 5\' 7"  (170.2 cm) Weight: 65.8 kg (145 lb) IBW/kg (Calculated) : 66.1  Temp (24hrs), Avg:98.8 F (37.1 C), Min:98.8 F (37.1 C), Max:98.8 F (37.1 C)  Recent Labs  Lab 06/23/22 1555 06/28/22 0815 06/29/22 0358 06/29/22 0708  WBC 15.3* 12.0* 15.1*  --   CREATININE 0.86 0.94 0.99  --   LATICACIDVEN  --   --  4.0* 1.8    Estimated Creatinine Clearance: 83.1 mL/min (by C-G formula based on SCr of 0.99 mg/dL).    Allergies  Allergen Reactions   Codeine Nausea Only    Antimicrobials this admission: 9/7 cefepime >>  9/7 vancomycin >>  9/7 ampicillin >>  9/7 acyclovir >>   Dose adjustments this admission: none  Microbiology results: 9/7 BCx: pending 9/7 UCx: pending  9/7 CSF cx: needs to be collected 9/7 meningitis/encephalitis panel: needs to be collected   Thank you for allowing pharmacy to be a part of this patient's care.  Oswald Hillock, PharmD, BCPS 06/29/2022 10:22 AM

## 2022-06-29 NOTE — ED Provider Notes (Signed)
[facility] Provider Note    Event Date/Time   First MD Initiated Contact with Patient 06/29/22 [phone]     (approximate)   History   Altered Mental Status   HPI  Jose Morse is a 51 y.o. male who presents to the ED for evaluation of Altered Mental Status   I reviewed oncology clinic visit from less than 24 hours ago.  History of small cell lung carcinoma, stage IV with extensive mets.  Was on immunotherapy, held due to joint pains.  On prednisone.  Mets to the brain and bone. I reviewed MRI brain from 6/26, has calvarial mets but resolution of lymphadenopathy and cerebellar lesions on this film.  Patient presents to the ED for evaluation of a first-time generalized seizure.  History is provided by family as patient is poorly responsive and cannot contribute to history.  Patient was at home and had a witnessed generalized tonic-clonic activity for a few minutes that self resolved and has not recurred.  Family reports that he has not talked about CODE STATUS and has not signed any documents, presumed full code.  They report that he has been less responsive, sleeping a lot and not saying much for the past few days.  If you try to talk with him he will just say "go away" and is poorly interactive, but has not had any other episodes of seizure, shaking or acute events such as syncope or falls.  Physical Exam   Triage Vital Signs: ED Triage Vitals [06/29/22 0349]  Enc Vitals Group     BP      Pulse      Resp      Temp      Temp src      SpO2      Weight 145 lb (65.8 kg)     Height _0  (1.702 m)     Head Circumference      Peak Flow      Pain Score 0     Pain Loc      Pain Edu?      Excl. in [place]?     Most recent vital signs: Vitals:   06/29/22 0354 06/29/22 0356  BP: 119/67   Pulse: (!) 104   Resp: 15   Temp:  98.8 F (37.1 C)  SpO2: 98%     General: Somnolent, localizes to sternal rub.  Seems to improve during his stay, on my  reevaluations later he will open his eyes and make eye contact with me with saying his name Jose Morse) and grunt, but will not say anything. CV:  Good peripheral perfusion.  Resp:  Normal effort.  Faint and scattered diffuse expiratory wheezes, no tachypnea or distress.  Good airflow. Abd:  No distention.  Soft and benign MSK:  No deformity noted.  No trauma, injury or deformity Neuro:  No focal deficits appreciated. [id number], GCS9.  Localizes bilaterally and apparently symmetrically with sternal rub Other:     ED Results / Procedures / Treatments   Labs (all labs ordered are listed, but only abnormal results are displayed) Labs Reviewed  LACTIC ACID, PLASMA - Abnormal; Notable for the following components:      Result Value   Lactic Acid, Venous 4.0 (*)    All other components within normal limits  COMPREHENSIVE METABOLIC PANEL - Abnormal; Notable for the following components:   CO2 20 (*)    Glucose, Bld 125 (*)    BUN 24 (*)  Calcium 8.4 (*)    Total Bilirubin 1.8 (*)    All other components within normal limits  CBC WITH DIFFERENTIAL/PLATELET - Abnormal; Notable for the following components:   WBC 15.1 (*)    Platelets 141 (*)    Neutro Abs 13.2 (*)    All other components within normal limits  URINALYSIS, COMPLETE (UACMP) WITH MICROSCOPIC - Abnormal; Notable for the following components:   Color, Urine YELLOW (*)    APPearance HAZY (*)    Hgb urine dipstick SMALL (*)    Ketones, ur 80 (*)    Protein, ur 30 (*)    All other components within normal limits  BLOOD GAS, ARTERIAL - Abnormal; Notable for the following components:   pO2, Arterial 39 (*)    Acid-base deficit 2.3 (*)    All other components within normal limits  CULTURE, BLOOD (ROUTINE X 2)  SARS CORONAVIRUS 2 BY RT PCR  CULTURE, BLOOD (ROUTINE X 2)  URINE CULTURE  PROTIME-INR  APTT  PROCALCITONIN  AMMONIA  LACTIC ACID, PLASMA    EKG Sinus tachycardia the rate of 103 bpm.  Normal axis and intervals.  QTc  477.  No signs of acute ischemia.  RADIOLOGY CT head interpreted by me without evidence of acute intracranial pathology CXR interpreted by me without evidence of acute cardiopulmonary pathology.  Official radiology report(s): CT HEAD WO CONTRAST (5MM)  Result Date: 06/29/2022 CLINICAL DATA:  Possible seizure activity per family. Lung cancer patient with previous metastases to the brain. EXAM: CT HEAD WITHOUT CONTRAST TECHNIQUE: Contiguous axial images were obtained from the base of the skull through the vertex without intravenous contrast. RADIATION DOSE REDUCTION: This exam was performed according to the departmental dose-optimization program which includes automated exposure control, adjustment of the mA and/or kV according to patient size and/or use of iterative reconstruction technique. COMPARISON:  MRI brain 04/17/2022 FINDINGS: Technical note: The images are grainy due to beam hardening related to right lateral decubitus positioning of the head on exam. Brain: No evidence of acute infarction, hemorrhage, hydrocephalus, extra-axial collection or mass lesion/mass effect. Vascular: No hyperdense vessel or unexpected calcification. Skull: Negative for fracture or visible lesion. MR brain demonstrated a likely calvarial small metastasis in the posterior right parietal high convexity, but there is no corresponding CT finding. Sinuses/Orbits: There is chronic membrane thickening in the ethmoids and maxillary sinuses, retention cysts in the right maxillary sinus and sphenoid sinus. Rest of the sinuses and mastoid air cells are clear. Unremarkable orbital contents. Other: None. IMPRESSION: Grainy images due to positioning of the patient. No convincing acute intracranial CT abnormality or interval changes. 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 Electronically Signed   By: Telford Nab M.D.   On: 06/29/2022 06:22    DG Chest Port 1 View  Result Date: 06/29/2022 CLINICAL DATA:  Sepsis. EXAM: PORTABLE CHEST 1 VIEW COMPARISON:  02/10/2022 FINDINGS: There is a right chest wall port a catheter with tip at the cavoatrial junction. Heart size and mediastinal contours are unremarkable. No pleural effusion or edema identified. IMPRESSION: No active disease. Electronically Signed   By: Kerby Moors M.D.   On: 06/29/2022 04:55    PROCEDURES and INTERVENTIONS:  .1-3 Lead EKG Interpretation  Performed by: Vladimir Crofts, MD Authorized by: Vladimir Crofts, MD     Interpretation: abnormal     ECG rate:  102   ECG rate assessment: tachycardic     Rhythm: sinus tachycardia  Ectopy: none     Conduction: normal   .Critical Care  Performed by: Vladimir Crofts, MD Authorized by: Vladimir Crofts, MD   Critical care provider statement:    Critical care time (minutes):  75   Critical care time was exclusive of:  Separately billable procedures and treating other patients   Critical care was necessary to treat or prevent imminent or life-threatening deterioration of the following conditions:  CNS failure or compromise   Critical care was time spent personally by me on the following activities:  Development of treatment plan with patient or surrogate, discussions with consultants, evaluation of patient's response to treatment, examination of patient, ordering and review of laboratory studies, ordering and review of radiographic studies, ordering and performing treatments and interventions, pulse oximetry, re-evaluation of patient's condition and review of old charts   Medications  levETIRAcetam (KEPPRA) IVPB 1500 mg/ 100 mL premix (0 mg Intravenous Stopped 06/29/22 0503)  lactated ringers bolus 1,000 mL (1,000 mLs Intravenous New Bag/Given 06/29/22 0503)     IMPRESSION / MDM / [place] / ED COURSE  I reviewed the triage vital signs and the nursing notes.  Differential diagnosis includes, but is not limited to,  seizure, stroke, status epilepticus, hypercarbia, sepsis, metabolic derangement, hyperammonemia  {Patient presents with symptoms of an acute illness or injury that is potentially life-threatening.  51 year old male with widely metastatic lung cancer presents after a first-time seizure.  He presents with appears to be postictal but without recurrence of any seizure activity.  He is clinically improving and getting near his recent baseline.  GCS of 9, he localizes to pain, opens eyes and looks to when he says his name.  No clonus, rigidity.  No clear signs of status epilepticus, though subclinical status is a possibility.  Serum work-up is largely unremarkable, his lactic acidosis, likely due to his witnessed seizure.  Negative procalcitonin and ammonia.  No significant derangements on CMP.  Leukocytosis is noted and nonspecific, possibly reactive to his seizure.  No other signs of infection, is a clear UA and CXR.  CT head without clear mass or bleed.  I suspect patient would benefit from an EEG and MRI to look for recurrence of mets or seizure activity.  We will consult with medicine for admission.  Clinical Course as of 06/29/22 0713  Thu Jun 29, 2022  0441 I met with patient's daughter who was at the waiting room.  She did not see any seizure activity, but was called to the room shortly after this and found him staring off into space and unresponsive.  Nondrinker, has not drank in years.  No seizure history.  Reports that he was "sick" for the past couple days, a lot of hiccups.  Got 2 new medicines at the oncologist office [DS]  [phone] Reassessed.  Resting on his side without obvious seizure activity.  When I state his name loudly, he opens his eyes and makes eye contact with me and grunts [DS]  0615 Brought son, Edison Nasuti, back to the room.  He has been spending time with the patient at home the past few days.  He reports that patient has not really been saying anything, just "go away" when he does.  I bring  Edison Nasuti back to the room, he reports that patient has not normal right now.  Confirms that he saw generalized shaking for couple minutes at home, just a one-time today without any previous episodes recently [DS]  0645 Long discussion with tearful wife [DS]  Clinical Course User Index [DS] Vladimir Crofts, MD     FINAL CLINICAL IMPRESSION(S) / ED DIAGNOSES   Final diagnoses:  Seizures ([place])  Altered mental status, unspecified altered mental status type  Lactic acidosis  Ketonuria  Malignant neoplasm of left lung, unspecified part of lung ([place])     Rx / DC Orders   ED Discharge Orders     None        Note:  This document was prepared using Dragon voice recognition software and may include unintentional dictation errors.   Vladimir Crofts, MD 06/29/22 [phone]

## 2022-06-29 NOTE — Assessment & Plan Note (Signed)
-  Nicotine patch 

## 2022-06-29 NOTE — Consult Note (Addendum)
Neurology Consultation  Reason for Consult: Seizure Referring Physician: Dr. Blaine Hamper  CC: Seizure  History is obtained from: Chart, patient's family  HPI: Jose Morse is a 51 y.o. male past medical history of stage IV lung cancer metastatic to bone and brain, status postchemotherapy, immunotherapy and prednisone, presenting for evaluation of altered mental status and seizure. Family reported that they had a monitor in his room and noted him to be having a generalized tonic-clonic seizure around 2:40 AM.  He has remained extremely encephalopathic after that and has not talked.   He has actually been not feeling well for the past few days.  He started on an immune therapy at the cancer center on Monday in addition to prednisone and has had difficulty with intractable hiccups.  He was given baclofen at increasing doses over the past few days.  Family reports that he has been more sleepy and lethargic, he is also had trouble getting his words out and trouble speaking with them.  He is being ill-appearing and has had poor p.o. intake but no fevers or chills reported. He is a patient of the cancer center-oncology consult has also been requested at this time. Patient unable to provide any history. Wife, daughter and son at bedside provided most of the history. Patient had a brain scan done in June which showed resolution of small cerebellar metastatic lesions from the prior scan.   ROS: Unable to obtain due to altered mental status.   Past Medical History:  Diagnosis Date   History of kidney stones    Hypercholesteremia      Family History  Problem Relation Age of Onset   Ovarian cancer Mother    Colon cancer Father    Diabetes Father      Social History:   reports that he has been smoking cigarettes. He has a 17.00 pack-year smoking history. He has never used smokeless tobacco. He reports that he does not currently use drugs after having used the following drugs: Marijuana. He reports  that he does not drink alcohol.  Medications  Current Facility-Administered Medications:    0.9 %  sodium chloride infusion, , Intravenous, Continuous, Ivor Costa, MD   albuterol (PROVENTIL) (2.5 MG/3ML) 0.083% nebulizer solution 2.5 mg, 2.5 mg, Nebulization, Q4H PRN, Ivor Costa, MD   dextromethorphan-guaiFENesin ([place] DM) 30-600 MG per 12 hr tablet 1 tablet, 1 tablet, Oral, BID PRN, Ivor Costa, MD   enoxaparin (LOVENOX) injection 40 mg, 40 mg, Subcutaneous, Q24H, Ivor Costa, MD   LORazepam (ATIVAN) injection 2 mg, 2 mg, Intravenous, Q2H PRN, Ivor Costa, MD   nicotine (NICODERM CQ - dosed in mg/24 hours) patch 21 mg, 21 mg, Transdermal, Daily, Ivor Costa, MD  Current Outpatient Medications:    albuterol (PROVENTIL HFA) 108 (90 Base) MCG/ACT inhaler, Inhale 2 puffs into the lungs every 4 (four) hours as needed for wheezing or shortness of breath., Disp: 1 each, Rfl: 0   baclofen (LIORESAL) 10 MG tablet, Take 0.5-1 tablets (5-10 mg total) by mouth 3 (three) times daily., Disp: 10 each, Rfl: 0   Budeson-Glycopyrrol-Formoterol (BREZTRI AEROSPHERE) 160-9-4.8 MCG/ACT AERO, Inhale 2 puffs into the lungs in the morning and at bedtime., Disp: , Rfl:    chlorproMAZINE (THORAZINE) 25 MG tablet, Take 1 tablet (25 mg total) by mouth 3 (three) times daily., Disp: 45 tablet, Rfl: 0   lidocaine-prilocaine (EMLA) cream, Apply on the port. 30 -45 min  prior to port access., Disp: 30 g, Rfl: 3   metoCLOPramide (REGLAN) 10 MG  tablet, Take 1 tablet (10 mg total) by mouth 4 (four) times daily -  before meals and at bedtime., Disp: 60 tablet, Rfl: 0   ondansetron (ZOFRAN) 8 MG tablet, One pill every 8 hours as needed for nausea/vomitting., Disp: 40 tablet, Rfl: 1   ondansetron (ZOFRAN-ODT) 8 MG disintegrating tablet, Take 1 tablet (8 mg total) by mouth every 8 (eight) hours as needed for nausea or vomiting., Disp: 45 tablet, Rfl: 0   oxyCODONE-acetaminophen (PERCOCET) 10-325 MG tablet, Take 1 tablet by mouth every  6 (six) hours as needed for pain., Disp: 60 tablet, Rfl: 0   pantoprazole (PROTONIX) 40 MG tablet, Take 1 tablet (40 mg total) by mouth 2 (two) times daily. 1 hour prior to breakfast/dinner., Disp: 60 each, Rfl: 1   potassium chloride SA (KLOR-CON M20) 20 MEQ tablet, 1 TABLET TWICE A DAY, Disp: 60 tablet, Rfl: 1   predniSONE (DELTASONE) 20 MG tablet, Take 1 tablet (20 mg total) by mouth daily with breakfast. Take half a pill a day for 1 week; and then half a pill every other day; and then stop.  Take it in the morning with food., Disp: 10 tablet, Rfl: 0   rosuvastatin (CRESTOR) 5 MG tablet, Take 5 mg by mouth at bedtime., Disp: , Rfl:   Facility-Administered Medications Ordered in Other Encounters:    dexamethasone (DECADRON) 10 MG/ML injection, , , ,    heparin lock flush 100 UNIT/ML injection, , , ,    Exam: Current vital signs: BP 119/67 (BP Location: Right Arm)   Pulse (!) 104   Temp 98.8 F (37.1 C) (Oral)   Resp 15   Ht 5\' 7"  (1.702 m)   Wt 65.8 kg   SpO2 98%   BMI 22.71 kg/m  Vital signs in last 24 hours: Temp:  [98.8 F (37.1 C)] 98.8 F (37.1 C) (09/07 0356) Pulse Rate:  [68-104] 104 (09/07 0354) Resp:  [15] 15 (09/07 0354) BP: (119-130)/(67-87) 119/67 (09/07 0354) SpO2:  [98 %] 98 % (09/07 0354) Weight:  [65.8 kg] 65.8 kg (09/07 0349) General: Extremely lethargic, opens eyes to voice but does not follow commands HEENT: Normocephalic/atraumatic Lungs: Clear Cardiovascular: Regular rhythm Abdomen nondistended nontender Neurological exam is challenging at this time due to his altered mentation and cooperation Extremity lethargic, opens eyes to voice but does not follow commands Was able to after a lot of coaxing able to say his name-she goes by his middle name Jose Morse. He is able to move both his arms and legs spontaneously full strength. Withdrawal to noxious stimulation is also normal Pupils are equal round reactive light, no gaze deviation or preference at this  time.  Labs I have reviewed labs in epic and the results pertinent to this consultation are:  CBC    Component Value Date/Time   WBC 15.1 (H) 06/29/2022 0358   RBC 4.51 06/29/2022 0358   HGB 14.1 06/29/2022 0358   HCT 42.8 06/29/2022 0358   PLT 141 (L) 06/29/2022 0358   MCV 94.9 06/29/2022 0358   MCH 31.3 06/29/2022 0358   MCHC 32.9 06/29/2022 0358   RDW 13.1 06/29/2022 0358   LYMPHSABS 0.9 06/29/2022 0358   MONOABS 0.8 06/29/2022 0358   EOSABS 0.1 06/29/2022 0358   BASOSABS 0.0 06/29/2022 0358    CMP     Component Value Date/Time   NA 135 06/29/2022 0358   K 3.6 06/29/2022 0358   CL 104 06/29/2022 0358   CO2 20 (L) 06/29/2022 0358   GLUCOSE 125 (  H) 06/29/2022 0358   BUN 24 (H) 06/29/2022 0358   CREATININE 0.99 06/29/2022 0358   CALCIUM 8.4 (L) 06/29/2022 0358   PROT 6.7 06/29/2022 0358   ALBUMIN 3.9 06/29/2022 0358   AST 27 06/29/2022 0358   ALT 39 06/29/2022 0358   ALKPHOS 59 06/29/2022 0358   BILITOT 1.8 (H) 06/29/2022 0358   GFRNONAA >60 06/29/2022 0358   GFRAA >60 12/29/2016 1805    Lipid Panel     Component Value Date/Time   CHOL 219 (HH) 11/08/2007 1524   TRIG 263 (HH) 11/08/2007 1524   HDL 33.1 (L) 11/08/2007 1524   CHOLHDL 6.6 CALC 11/08/2007 1524   VLDL 53 (H) 11/08/2007 1524   LDLDIRECT 153.9 11/08/2007 1524     Imaging I have reviewed the images obtained:  CT-head-extreme motion artifact-no acute findings  Brain ordered-unable to tolerate  Assessment:  51 year old man with metastatic small cell lung cancer to the bone and brain, with new onset seizure.  Has had past few days of worsening mentation, worsening ability to walk and has been more lethargic over the past few days.  Also has had intractable hiccups for which she has been prescribed escalating doses of baclofen. At this time, his seizures could simply be secondary to lowering of the seizure threshold from baclofen versus new mets. His last brain MRI in June showed resolution of 2  small cerebellar mets in comparison to the prior scan. Other differentials include a CNS infection given his immunosuppression with steroids and immune therapy for his cancer.   Impression: - New onset seizure, rule out status epilepticus - Metastatic small cell lung cancer to the bone and brain - Altered mental status-broad differential including CNS infection, possible malignant spread to the brain   Recommendations:  For new onset seizure: - Received a loading dose of Keppra 1500 mg. -Start Keppra 500 twice daily - Maintain seizure precautions -EEG-if shows ongoing seizure activity, may need to transfer to tertiary care center for continuous EEG  For altered mental status/concern for CNS infection/evaluate for carcinomatous meningitis: -Empiric meningitic coverage including ampicillin - LP with CSF to be sent for glucose, protein, cell count, bacterial and fungal cultures, meningitis encephalitis panel and cytology-orders have been placed in the chart.  Hospitalist to coordinate lumbar puncture with ER or IR as appropriate. - MRI of the brain with and without contrast when able to  For metastatic lung cancer: - Oncology consultation-for prognosis as well as input on current medications that he is on. - Palliative consultation - I had a detailed conversation with the family-wife, son and daughter.  Dr. Blaine Hamper was also present for part of these conversations.  He also had a detailed conversation about goals of care.  For now they want to continue full code but if he has an event where he requires resuscitation and he is not coming around well, at that time they might think of restricting the scope of care going forward.  I think a palliative medicine consultation would really help them and I do appreciate in advance the palliative medicine team's involvement in his case.   ADDENDUM EEG completed-shows evidence of cortical dysfunction and epileptogenicity from right parieto-occipital  region although no seizures were identified throughout the recording.  CSF with bloody tap-RBCs 3615 down to 799 in tube #4, WBC 158 and 121.  CSF glucose less than 20, CSF protein 131. Differentials based on preliminary CSF results: Bacterial meningitis versus carcinomatous meningitis.    Broad-spectrum antibiotic coverage and dexamethasone added  Over the course of the day, CSF prelim review by the pathologist consistent with malignant spread of the non-small cell lung cancer.  Oncological team and palliative medicine contacted.  Unfortunately at this time, he is not a candidate for any further interventions and family has chosen the hospice route.  Appreciate palliative medicine team taking over his care from here on out.  Inpatient neurology will be available as needed.  Please call with questions.  -- Amie Portland, MD Neurologist [facility] Pager: [phone]  CRITICAL CARE ATTESTATION Performed by: Amie Portland, MD Total critical care time: 70 minutes Critical care time was exclusive of separately billable procedures and treating other patients and/or supervising APPs/Residents/Students Critical care was necessary to treat or prevent imminent or life-threatening deterioration due to new onset seizure in the setting of metastatic lung cancer to the brain and bone, possible status epilepticus, possible CNS infection This patient is critically ill and at significant risk for neurological worsening and/or death and care requires constant monitoring. Critical care was time spent personally by me on the following activities: development of treatment plan with patient and/or surrogate as well as nursing, discussions with consultants, evaluation of patient's response to treatment, examination of patient, obtaining history from patient or surrogate, ordering and performing treatments and interventions, ordering and review of laboratory studies, ordering and review of radiographic  studies, pulse oximetry, re-evaluation of patient's condition, participation in multidisciplinary rounds and medical decision making of high complexity in the care of this patient.

## 2022-06-29 NOTE — ED Provider Notes (Signed)
-----------------------------------------   11:53 AM on 06/29/2022 ----------------------------------------- Patient had been admitted by Dr. Blaine Hamper for quite some time however neurology had consulted and believe the patient needed a stat lumbar puncture and had asked me if I was able to perform a stat lumbar puncture for further evaluation.  Given the patient's continued confusion formed a stat lumbar puncture in the emergency department.  Verbally consented with family they were agreeable to proceed.  Lumbar puncture obtained slightly traumatic but CSF cleared on subsequent tubes.  Large volume of CSF removed 16 mL total as requested by neurology.  Patient remains under the hospitalist care who is following up on the CSF labs.  LUMBAR PUNCTURE  Date/Time: 06/29/2022 at 11:54 AM Performed by: Harvest Dark  Consent: Verbal consent obtained from family. Risks and benefits: risks, benefits and alternatives were discussed Consent given by: Family/wife Patient understanding: patient states understanding of the procedure being performed  Patient consent: the patient's understanding of the procedure matches consent given  Procedure consent: procedure consent matches procedure scheduled  Relevant documents: relevant documents present and verified  Test results: test results available and properly labeled Site marked: the operative site was marked Imaging studies: imaging studies available  Required items: required blood products, implants, devices, and special equipment available  Patient identity confirmed: verbally with patient and arm band  Time out: Immediately prior to procedure a "time out" was called to verify the correct patient, procedure, equipment, support staff and site/side marked as required.  Indications: Altered mental status Anesthesia: local infiltration Local anesthetic: lidocaine 1% without epinephrine Anesthetic total: 5 ml Patient sedated: 1 mg Ativan Analgesia:  None Preparation: Patient was prepped and draped in the usual sterile fashion. Lumbar space: L3-L4 interspace Patient's position: left lateral decubitus Needle gauge: 22 Needle length: 3.5 in Number of attempts: 2 Opening pressure: 20 cm H2O Fluid appearance: Traumatic but cleared on subsequent tubes but maintained slight cloudiness throughout. Tubes of fluid: 4 Total volume: 16 ml (4 mL per tube) Post-procedure: site cleaned and adhesive bandage applied Patient tolerance: Patient tolerated the procedure well with no immediate complications    Harvest Dark, MD 06/29/22 1156

## 2022-06-29 NOTE — Assessment & Plan Note (Addendum)
Lumbar puncture was performed.  Initial analysis that showed WBC 121, 150, red blood cell 799, 3615, glucose level less than 20.  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 At this moment, cannot completely rule out infectious meningitis.  Consulted Dr. Delaine Lame of ID, Dr. Rogue Bussing of oncology.  Meningitis panel negative.  He was initially started on broad-spectrum antibiotics which were later discontinued.

## 2022-06-29 NOTE — Procedures (Signed)
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 EEG to evaluate for seizure.   Level of alertness: Awake, asleep  AEDs during EEG study: LEV  Technical aspects: This EEG study was done with scalp electrodes positioned according to the 10-20 International system of electrode placement. Electrical activity was reviewed with band pass filter of 1-70Hz , sensitivity of 7 uV/mm, display speed of 49mm/sec with a 60Hz  notched filter applied as appropriate. EEG data were recorded continuously and digitally stored.  Video monitoring was available and reviewed as appropriate.  Description: The posterior dominant rhythm consists of 8 Hz activity of moderate voltage (25-35 uV) seen predominantly in posterior head regions, symmetric and reactive to eye opening and eye closing. Sleep was characterized by vertex waves, sleep spindles (12 to 14 Hz), maximal frontocentral region. Intermittent sharply contoured 2 to 3 Hz delta slowing as well as spikes  were noted in right parieto-occipital region. Hyperventilation and photic stimulation were not performed.     ABNORMALITY -Spike, right parieto-occipital region -Intermittent slow, right right parieto-occipital region  IMPRESSION: This study showed evidence of epileptogenicity and cortical dysfunction arising from right parieto-occipital region. No seizures were seen throughout the recording.  Jose Morse

## 2022-06-29 NOTE — Progress Notes (Signed)
Attempted pt in MRI, too combative, unable to get pt to hold somewhat still to get any imaging. Please let MRI know if pts condition changes and we will attempt later today.

## 2022-06-29 NOTE — ED Notes (Signed)
Pt going to MRI

## 2022-06-29 NOTE — Progress Notes (Signed)
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  This is extremely poor prognosis.  Agree with hospice.  Appreciate the coordination of care with multiple services-hospitalist, ID, neurology and palliative care.   GB

## 2022-06-29 NOTE — H&P (Signed)
History and Physical    NICKLAUS ALVIAR JJH:[id number] DOB: [birth year] DOA: 06/29/2022  Referring MD/NP/PA:   PCP: Tracie Harrier, MD   Patient coming from:  The patient is coming from home.    Chief Complaint: seizure and altered mental status  HPI: Jose Morse is a 51 y.o. 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  Pt was on immunotherapy for SCLC which is held due to joint pains, currently on on prednisone. Pt had MRI brain from 04/14/22, which showed calvarial mets, but has resolution of lymphadenopathy and cerebellar lesions compared to MRI of brain on 02/16/22. Patient presents to the ED for evaluation of first-time generalized seizure. Per family, pt was noted to have 1 episode of seizure at home at about 2-3 AM, described as generalized tonic-clonic activity, lasted for a few minutes. Pt has been confused and not able to answer any questions.  He moves all extremities, no facial droop or slurred speech when I saw pt in ED. Per family, patient does not seem to have chest pain or abdominal pain.  No active nausea, vomiting, diarrhea or respiratory distress noted.  Patient has mild dry cough per his son.  Did not complain symptoms of UTI.  Pt was loaded with 1500 mg of Keppra in ED.  No more seizure in ED.  Data reviewed independently and ED Course: pt was found to have WBC 15.1, lactic acid 4.0 --> 1.8, procalcitonin < 01.0, negative urinalysis, negative COVID PCR, ammonia level 11, GFR> 60, INR 1.1, PTT 25, temperature 98.8, blood pressure 119/67, heart rate 104, 15, oxygen saturation 98% on room air.  Chest x-ray negative.  CT of head is negative for acute intracranial abnormalities.  Patient is admitted to progressive bed as inpatient. Dr. Rory Percy of neuro is consulted.   EKG: I have personally reviewed.  Sinus rhythm, QTc 477,  LAE,  low voltage  Review of Systems: Could not reviewed due to altered mental status.    Allergy:  Allergies  Allergen Reactions   Codeine Nausea Only    Past Medical History:  Diagnosis Date   History of kidney stones    Hypercholesteremia     Past Surgical History:  Procedure Laterality Date   CHOLECYSTECTOMY     IR IMAGING GUIDED PORT INSERTION  02/23/2022   VIDEO BRONCHOSCOPY WITH ENDOBRONCHIAL NAVIGATION N/A 02/10/2022   Procedure: VIDEO BRONCHOSCOPY WITH ENDOBRONCHIAL NAVIGATION;  Surgeon: Ottie Glazier, MD;  Location: [facility] ORS;  Service: Thoracic;  Laterality: N/A;   VIDEO BRONCHOSCOPY WITH ENDOBRONCHIAL ULTRASOUND N/A 02/10/2022   Procedure: VIDEO BRONCHOSCOPY WITH ENDOBRONCHIAL ULTRASOUND;  Surgeon: Ottie Glazier, MD;  Location: [facility] ORS;  Service: Thoracic;  Laterality: N/A;    Social History:  reports that he has been smoking cigarettes. He has a 17.00 pack-year smoking history. He has never used smokeless tobacco. He reports that he does not currently use drugs after having used the following drugs: Marijuana. He reports that he does not drink alcohol.  Family History:  Family History  Problem Relation Age of Onset   Ovarian cancer Mother    Colon cancer Father    Diabetes Father      Prior to Admission medications   Medication Sig Start Date End Date Taking? Authorizing Provider  albuterol (PROVENTIL HFA) 108 (90 Base) MCG/ACT inhaler Inhale 2 puffs into the lungs every 4 (four) hours as needed for wheezing  or shortness of breath. 12/28/21   Carrie Mew, MD  baclofen (LIORESAL) 10 MG tablet Take 0.5-1 tablets (5-10 mg total) by mouth 3 (three) times daily. 06/26/22   Earlie Server, MD  Budeson-Glycopyrrol-Formoterol (BREZTRI AEROSPHERE) 160-9-4.8 MCG/ACT AERO Inhale 2 puffs into the lungs in the morning and at bedtime.    [provider]  chlorproMAZINE (THORAZINE) 25 MG tablet Take 1 tablet (25 mg total) by mouth 3 (three) times daily. 06/28/22    Cammie Sickle, MD  lidocaine-prilocaine (EMLA) cream Apply on the port. 30 -45 min  prior to port access. 02/16/22   Cammie Sickle, MD  metoCLOPramide (REGLAN) 10 MG tablet Take 1 tablet (10 mg total) by mouth 4 (four) times daily -  before meals and at bedtime. 06/28/22   Cammie Sickle, MD  ondansetron (ZOFRAN) 8 MG tablet One pill every 8 hours as needed for nausea/vomitting. 02/16/22   Cammie Sickle, MD  ondansetron (ZOFRAN-ODT) 8 MG disintegrating tablet Take 1 tablet (8 mg total) by mouth every 8 (eight) hours as needed for nausea or vomiting. 06/23/22   Borders, Kirt Boys, NP  oxyCODONE-acetaminophen (PERCOCET) 10-325 MG tablet Take 1 tablet by mouth every 6 (six) hours as needed for pain. 06/23/22   Borders, Kirt Boys, NP  pantoprazole (PROTONIX) 40 MG tablet Take 1 tablet (40 mg total) by mouth 2 (two) times daily. 1 hour prior to breakfast/dinner. 06/28/22   Cammie Sickle, MD  potassium chloride SA (KLOR-CON M20) 20 MEQ tablet 1 TABLET TWICE A DAY 06/08/22   Cammie Sickle, MD  predniSONE (DELTASONE) 20 MG tablet Take 1 tablet (20 mg total) by mouth daily with breakfast. Take half a pill a day for 1 week; and then half a pill every other day; and then stop.  Take it in the morning with food. 06/28/22   Cammie Sickle, MD  rosuvastatin (CRESTOR) 5 MG tablet Take 5 mg by mouth at bedtime.    [provider]    Physical Exam: Vitals:   06/29/22 1530 06/29/22 1600 06/29/22 1630 06/29/22 1700  BP: 130/70 128/63 136/64 (!) 122/59  Pulse: 69 70 83 71  Resp:    20  Temp:      TempSrc:      SpO2: 97% 98% 100% 99%  Weight:      Height:       General: Not in acute distress HEENT:       Eyes: PERRL, EOMI, no scleral icterus.       ENT: No discharge from the ears and nose       Neck: No JVD, no bruit, no mass felt. Heme: No neck lymph node enlargement. Cardiac: S1/S2, RRR, No murmurs, No gallops or rubs. Respiratory: No rales, wheezing,  rhonchi or rubs. GI: Soft, nondistended, no organomegaly, BS present. GU: No hematuria Ext: No pitting leg edema bilaterally. 1+DP/PT pulse bilaterally. Musculoskeletal: No joint deformities, No joint redness or warmth, no limitation of ROM in spin. Skin: No rashes.  Neuro: Patient is confused, arousable, not following command, not oriented X3, cranial nerves II-XII grossly intact, moves all extremities  Psych: Patient is not psychotic, no suicidal or hemocidal ideation.   Labs on Admission: I have personally reviewed following labs and imaging studies  CBC: Recent Labs  Lab 06/23/22 1555 06/28/22 0815 06/29/22 0358  WBC 15.3* 12.0* 15.1*  NEUTROABS 11.6* 8.3* 13.2*  HGB 15.1 15.7 14.1  HCT 47.6 48.1 42.8  MCV 97.1 95.1 94.9  PLT 170  162 382*   Basic Metabolic Panel: Recent Labs  Lab 06/23/22 1555 06/28/22 0815 06/29/22 0358  NA 139 138 135  K 3.9 3.7 3.6  CL 102 103 104  CO2 24 28 20*  GLUCOSE 91 103* 125*  BUN 31* 24* 24*  CREATININE 0.86 0.94 0.99  CALCIUM 9.5 9.5 8.4*  MG 2.1  --   --    GFR: Estimated Creatinine Clearance: 83.1 mL/min (by C-G formula based on SCr of 0.99 mg/dL). Liver Function Tests: Recent Labs  Lab 06/23/22 1555 06/28/22 0815 06/29/22 0358  AST 23 20 27   ALT 64* 45* 39  ALKPHOS 69 62 59  BILITOT 1.4* 1.6* 1.8*  PROT 7.9 7.7 6.7  ALBUMIN 4.4 4.3 3.9   No results for input(s): "LIPASE", "AMYLASE" in the last 168 hours. Recent Labs  Lab 06/29/22 0418  AMMONIA 11   Coagulation Profile: Recent Labs  Lab 06/29/22 0358  INR 1.1   Cardiac Enzymes: No results for input(s): "CKTOTAL", "CKMB", "CKMBINDEX", "TROPONINI" in the last 168 hours. BNP (last 3 results) No results for input(s): "PROBNP" in the last 8760 hours. HbA1C: No results for input(s): "HGBA1C" in the last 72 hours. CBG: No results for input(s): "GLUCAP" in the last 168 hours. Lipid Profile: No results for input(s): "CHOL", "HDL", "LDLCALC", "TRIG", "CHOLHDL",  "LDLDIRECT" in the last 72 hours. Thyroid Function Tests: No results for input(s): "TSH", "T4TOTAL", "FREET4", "T3FREE", "THYROIDAB" in the last 72 hours. Anemia Panel: No results for input(s): "VITAMINB12", "FOLATE", "FERRITIN", "TIBC", "IRON", "RETICCTPCT" in the last 72 hours. Urine analysis:    Component Value Date/Time   COLORURINE YELLOW (A) 06/29/2022 0418   APPEARANCEUR HAZY (A) 06/29/2022 0418   LABSPEC 1.021 06/29/2022 0418   PHURINE 5.0 06/29/2022 0418   GLUCOSEU NEGATIVE 06/29/2022 0418   GLUCOSEU NEGATIVE 11/08/2007 1524   HGBUR SMALL (A) 06/29/2022 0418   BILIRUBINUR NEGATIVE 06/29/2022 0418   KETONESUR 80 (A) 06/29/2022 0418   PROTEINUR 30 (A) 06/29/2022 0418   UROBILINOGEN 1.0 04/05/2009 0800   NITRITE NEGATIVE 06/29/2022 0418   LEUKOCYTESUR NEGATIVE 06/29/2022 0418   Sepsis Labs: @LABRCNTIP (procalcitonin:4,lacticidven:4) ) Recent Results (from the past 240 hour(s))  SARS Coronavirus 2 by RT PCR (hospital order, performed in [facility] hospital lab) *cepheid single result test* Anterior Nasal Swab     Status: None   Collection Time: 06/29/22  4:18 AM   Specimen: Anterior Nasal Swab  Result Value Ref Range Status   SARS Coronavirus 2 by RT PCR NEGATIVE NEGATIVE Final    Comment: (NOTE) SARS-CoV-2 target nucleic acids are NOT DETECTED.  The SARS-CoV-2 RNA is generally detectable in upper and lower respiratory specimens during the acute phase of infection. The lowest concentration of SARS-CoV-2 viral copies this assay can detect is 250 copies / mL. A negative result does not preclude SARS-CoV-2 infection and should not be used as the sole basis for treatment or other patient management decisions.  A negative result may occur with improper specimen collection / handling, submission of specimen other than nasopharyngeal swab, presence of viral mutation(s) within the areas targeted by this assay, and inadequate number of viral copies (<250 copies / mL). A  negative result must be combined with clinical observations, patient history, and epidemiological information.  Fact Sheet for Patients:   https://www.patel.info/  Fact Sheet for Healthcare Providers: https://hall.com/  This test is not yet approved or  cleared by the [place] FDA and has been authorized for detection and/or diagnosis of SARS-CoV-2 by FDA under an Emergency  Use Authorization (EUA).  This EUA will remain in effect (meaning this test can be used) for the duration of the COVID-19 declaration under Section 564(b)(1) of the Act, 21 U.S.C. section 360bbb-3(b)(1), unless the authorization is terminated or revoked sooner.  Performed at [facility], [address] [place], [place] [address]   Blood Culture (routine x 2)     Status: None (Preliminary result)   Collection Time: 06/29/22  4:22 AM   Specimen: BLOOD  Result Value Ref Range Status   Specimen Description BLOOD RIGHT ASSIST CONTROL  Final   Special Requests   Final    BOTTLES DRAWN AEROBIC AND ANAEROBIC Blood Culture adequate volume   Culture   Final    NO GROWTH <12 HOURS Performed at [facility], [address], [place], [place] [address]    Report Status PENDING  Incomplete  CSF culture w Gram Stain     Status: None (Preliminary result)   Collection Time: 06/29/22 11:00 AM   Specimen: CSF; Cerebrospinal Fluid  Result Value Ref Range Status   Specimen Description CSF  Final   Special Requests Immunocompromised  Final   Gram Stain   Final    WBC SEEN RED BLOOD CELLS NO ORGANISMS SEEN Performed at [facility], [address], [place], [place] [address]    Culture PENDING  Incomplete   Report Status PENDING  Incomplete     Radiological Exams on Admission: EEG adult  Result Date: 06/29/2022 Lora Havens, MD     06/29/2022  1:00 PM Patient Name: Jose Morse MRN: [id number] Epilepsy Attending: Lora Havens  Referring Physician/Provider: Amie Portland, MD Date: 06/29/2022 Duration: 21.53 mins Patient history: 51 year old man with metastatic small cell lung cancer to the bone and brain, with new onset seizure. EEG to evaluate for seizure. Level of alertness: Awake, asleep AEDs during EEG study: LEV Technical aspects: This EEG study was done with scalp electrodes positioned according to the 10-20 International system of electrode placement. Electrical activity was reviewed with band pass filter of 1-70Hz , sensitivity of 7 uV/mm, display speed of 99mm/sec with a 60Hz  notched filter applied as appropriate. EEG data were recorded continuously and digitally stored.  Video monitoring was available and reviewed as appropriate. Description: The posterior dominant rhythm consists of 8 Hz activity of moderate voltage (25-35 uV) seen predominantly in posterior head regions, symmetric and reactive to eye opening and eye closing. Sleep was characterized by vertex waves, sleep spindles (12 to 14 Hz), maximal frontocentral region. Intermittent sharply contoured 2 to 3 Hz delta slowing as well as spikes  were noted in right parieto-occipital region. Hyperventilation and photic stimulation were not performed.   ABNORMALITY -Spike, right parieto-occipital region -Intermittent slow, right right parieto-occipital region IMPRESSION: This study showed evidence of epileptogenicity and cortical dysfunction arising from right parieto-occipital region. No seizures were seen throughout the recording. Priyanka Barbra Sarks   CT HEAD WO CONTRAST (5MM)  Result Date: 06/29/2022 CLINICAL DATA:  Possible seizure activity per family. Lung cancer patient with previous metastases to the brain. EXAM: CT HEAD WITHOUT CONTRAST TECHNIQUE: Contiguous axial images were obtained from the base of the skull through the vertex without intravenous contrast. RADIATION DOSE REDUCTION: This exam was performed according to the departmental dose-optimization program which  includes automated exposure control, adjustment of the mA and/or kV according to patient size and/or use of iterative reconstruction technique. COMPARISON:  MRI brain 04/17/2022 FINDINGS: Technical note: The images are grainy due to beam hardening related to  right lateral decubitus positioning of the head on exam. Brain: No evidence of acute infarction, hemorrhage, hydrocephalus, extra-axial collection or mass lesion/mass effect. Vascular: No hyperdense vessel or unexpected calcification. Skull: Negative for fracture or visible lesion. MR brain demonstrated a likely calvarial small metastasis in the posterior right parietal high convexity, but there is no corresponding CT finding. Sinuses/Orbits: There is chronic membrane thickening in the ethmoids and maxillary sinuses, retention cysts in the right maxillary sinus and sphenoid sinus. Rest of the sinuses and mastoid air cells are clear. Unremarkable orbital contents. Other: None. IMPRESSION: Grainy images due to positioning of the patient. No convincing acute intracranial CT abnormality or interval changes. 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 Electronically Signed   By: Telford Nab M.D.   On: 06/29/2022 06:22   DG Chest Port 1 View  Result Date: 06/29/2022 CLINICAL DATA:  Sepsis. EXAM: PORTABLE CHEST 1 VIEW COMPARISON:  02/10/2022 FINDINGS: There is a right chest wall port a catheter with tip at the cavoatrial junction. Heart size and mediastinal contours are unremarkable. No pleural effusion or edema identified. IMPRESSION: No active disease. Electronically Signed   By: Kerby Moors M.D.   On: 06/29/2022 04:55      Assessment/Plan Principal Problem:   Seizure ([place]) Active Problems:   Meningitis   Severe sepsis (HCC)   Tobacco abuse   HLD (hyperlipidemia)   Primary cancer of left upper lobe of lung (HCC)   GERD (gastroesophageal reflux  disease)   Assessment and Plan: * Seizure (HCC) Consulted Dr. Rory Percy of neurology.  MRI was ordered, but could not be performed due to noncoperation  - Will admit to PCU as inpt -Keppra, 1500 mg loaded in ED, will continue 500 mg twice daily -Seizure precaution -As needed Ativan for seizure -Frequent neurochecks -EEG - hold baclofen since it decreases the threshold of seizure  Meningitis Lumbar puncture was performed.  Initial analysis that showed WBC 121, 150, red blood cell 799, 3615, glucose level less than 20.  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 At this moment, cannot completely rule out infectious meningitis.  Consulted Dr. Delaine Lame of ID, Dr. Rogue Bussing of oncology.  -Started broad antibiotics: Vancomycin, Rocephin, acyclovir, ampicillin -Decadron 10 mg every 6 hours -Follow-up CSF analysis and CSF culture -f/u blood culture  Severe sepsis (HCC) Severe sepsis due to meningitis: Patient meets criteria for severe sepsis with WBC 15.1, heart rate 104, elevated lactic acid of 4.0.  His elevated lactic acid is partially due to seizure. -On broad antibiotics as above -IV fluid: 1 L LR, 500 cc normal saline, then 125 cc normal saline per hour -Trend lactic acid level -Check procalcitonin level  --> < 0.10  Tobacco abuse -Nicotine patch  HLD (hyperlipidemia) -crestor  Primary cancer of left upper lobe of lung ([place]) Patient seems to have responded to immunotherapy, unfortunately immunotherapy had to be held due to intolerance.  Patient is currently taking prednisone.  Now the new finding shows cancer cells in CSF, indicating very poor prognosis.  Consulted Dr. Rogue Bussing of oncology -[facility] team is involved  GERD (gastroesophageal reflux disease) - Protonix       DVT ppx:  SCD  Code Status: Full code (discussed with family about CODE STATUS, they cannot make final decision at this moment. The  agreed to consult palliative care, will order full code now)   Family Communication:  Yes, patient's wife, son and daughter at bed side.   Disposition Plan:  Anticipate discharge back to previous environment  Consults called: Dr. Rory Percy of neurology  Admission status and Level of care: Progressive:   as inpt       Dispo: The patient is from: Home              Anticipated d/c is to: Home              Anticipated d/c date is: 2 days              Patient currently is not medically stable to d/c.    Severity of Illness:  The appropriate patient status for this patient is INPATIENT. Inpatient status is judged to be reasonable and necessary in order to provide the required intensity of service to ensure the patient's safety. The patient's presenting symptoms, physical exam findings, and initial radiographic and laboratory data in the context of their chronic comorbidities is felt to place them at high risk for further clinical deterioration. Furthermore, it is not anticipated that the patient will be medically stable for discharge from the hospital within 2 midnights of admission.   * I certify that at the point of admission it is my clinical judgment that the patient will require inpatient hospital care spanning beyond 2 midnights from the point of admission due to high intensity of service, high risk for further deterioration and high frequency of surveillance required.*       Date of Service 06/29/2022    Ivor Costa [facility]   If 7PM-7AM, please contact night-coverage [email or web address] 06/29/2022, 6:05 PM

## 2022-06-29 NOTE — Assessment & Plan Note (Signed)
Protonix.  ?

## 2022-06-29 NOTE — Progress Notes (Signed)
Eeg done 

## 2022-06-29 NOTE — ED Notes (Signed)
Pt constantly pulling at telemetry leads, BP cords, IV tubing. Not able to be redirected at this time.

## 2022-06-29 NOTE — Assessment & Plan Note (Addendum)
Sepsis ruled out.  No source of infection.  Patient has SIRS criteria secondary to new onset seizures with worsening of metastatic brain disease. Antibiotics were discontinued

## 2022-06-30 ENCOUNTER — Inpatient Hospital Stay: Payer: BC Managed Care – PPO

## 2022-06-30 DIAGNOSIS — R569 Unspecified convulsions: Secondary | ICD-10-CM | POA: Diagnosis not present

## 2022-06-30 DIAGNOSIS — E785 Hyperlipidemia, unspecified: Secondary | ICD-10-CM | POA: Diagnosis not present

## 2022-06-30 DIAGNOSIS — Z515 Encounter for palliative care: Secondary | ICD-10-CM | POA: Diagnosis not present

## 2022-06-30 LAB — CBC
HCT: 35.9 % — ABNORMAL LOW (ref 39.0–52.0)
Hemoglobin: 11.8 g/dL — ABNORMAL LOW (ref 13.0–17.0)
MCH: 30.5 pg (ref 26.0–34.0)
MCHC: 32.9 g/dL (ref 30.0–36.0)
MCV: 92.8 fL (ref 80.0–100.0)
Platelets: 119 10*3/uL — ABNORMAL LOW (ref 150–400)
RBC: 3.87 MIL/uL — ABNORMAL LOW (ref 4.22–5.81)
RDW: 13.1 % (ref 11.5–15.5)
WBC: 8.9 10*3/uL (ref 4.0–10.5)
nRBC: 0 % (ref 0.0–0.2)

## 2022-06-30 LAB — BASIC METABOLIC PANEL
Anion gap: 9 (ref 5–15)
BUN: 15 mg/dL (ref 6–20)
CO2: 17 mmol/L — ABNORMAL LOW (ref 22–32)
Calcium: 8.1 mg/dL — ABNORMAL LOW (ref 8.9–10.3)
Chloride: 111 mmol/L (ref 98–111)
Creatinine, Ser: 0.68 mg/dL (ref 0.61–1.24)
GFR, Estimated: >60 mL/min (ref >60)
Glucose, Bld: 101 mg/dL — ABNORMAL HIGH (ref 70–99)
Potassium: 3.8 mmol/L (ref 3.5–5.1)
Sodium: 137 mmol/L (ref 135–145)

## 2022-06-30 LAB — URINE CULTURE: Culture: NO GROWTH

## 2022-06-30 LAB — GLUCOSE, CAPILLARY: Glucose-Capillary: 100 mg/dL — ABNORMAL HIGH (ref 70–99)

## 2022-06-30 MED ORDER — CHLORPROMAZINE HCL 25 MG/ML IJ SOLN
12.5000 mg | Freq: Once | INTRAMUSCULAR | Status: AC
  Administered 2022-06-30: 12.5 mg via INTRAVENOUS
  Filled 2022-06-30: qty 0.5

## 2022-06-30 MED ORDER — SODIUM CHLORIDE 0.9% FLUSH
10.0000 mL | Freq: Two times a day (BID) | INTRAVENOUS | Status: DC
  Administered 2022-06-30 – 2022-07-01 (×2): 10 mL

## 2022-06-30 MED ORDER — GADOBUTROL 1 MMOL/ML IV SOLN
7.0000 mL | Freq: Once | INTRAVENOUS | Status: AC | PRN
  Administered 2022-06-30: 7 mL via INTRAVENOUS

## 2022-06-30 MED ORDER — CHLORHEXIDINE GLUCONATE CLOTH 2 % EX PADS: 6.0000 | MEDICATED_PAD | Freq: Every day | CUTANEOUS | Status: DC

## 2022-06-30 MED ORDER — LIDOCAINE 4 % EX CREA
TOPICAL_CREAM | Freq: Once | CUTANEOUS | Status: DC
  Filled 2022-06-30: qty 5

## 2022-06-30 MED ORDER — SODIUM CHLORIDE 0.9 % IV SOLN
25.0000 mg | Freq: Four times a day (QID) | INTRAVENOUS | Status: DC | PRN
  Administered 2022-06-30 – 2022-07-01 (×3): 25 mg via INTRAVENOUS
  Filled 2022-06-30 (×5): qty 1

## 2022-06-30 MED ORDER — SODIUM CHLORIDE 0.9 % IV SOLN
25.0000 mg | Freq: Once | INTRAVENOUS | Status: AC
  Administered 2022-06-30: 25 mg via INTRAVENOUS
  Filled 2022-06-30: qty 1

## 2022-06-30 MED ORDER — SODIUM CHLORIDE 0.9% FLUSH: 10.0000 mL | INTRAVENOUS | Status: DC | PRN

## 2022-06-30 NOTE — Progress Notes (Signed)
Manufacturing engineer (ACC)  Notified by PMT that patient and family would be needing hospice services.  Contacted with Judson Roch, daughter, the family would like to meet together this evening to process all of the information that they have been given over the last few days.  Judson Roch requested that [facility] f/u Saturday morning to have a meeting with Mr. Millette and family about d/c possibilities (home with hospice vs residential hospice).  Thank you, Venia Carbon DNP, RN [facility] Liaison

## 2022-06-30 NOTE — Progress Notes (Signed)
Progress Note   Patient: Jose Morse:[id number] DOB: 1970/11/17 DOA: 06/29/2022     1 DOS: the patient was seen and examined on 06/30/2022   Brief hospital course: Taken from H&P and prior notes.  Jose Morse is a 51 y.o. 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   Pt was on immunotherapy for SCLC which is held due to joint pains, currently on on prednisone. Pt had MRI brain from 04/14/22, which showed calvarial mets, but has resolution of lymphadenopathy and cerebellar lesions compared to MRI of brain on 02/16/22. Patient presents to the ED for evaluation of first-time generalized seizure. Per family, pt was noted to have 1 episode of seizure at home at about 2-3 AM, described as generalized tonic-clonic activity, lasted for a few minutes. Pt has been confused and not able to answer any questions.  He moves all extremities, no facial droop or slurred speech when I saw pt in ED. Per family, patient does not seem to have chest pain or abdominal pain.  No active nausea, vomiting, diarrhea or respiratory distress noted.  Patient has mild dry cough per his son.  Did not complain symptoms of UTI.   Pt was loaded with 1500 mg of Keppra in ED.  No more seizure in ED.   Data reviewed independently and ED Course: pt was found to have WBC 15.1, lactic acid 4.0 --> 1.8, procalcitonin < 01.0, negative urinalysis, negative COVID PCR, ammonia level 11, GFR> 60, INR 1.1, PTT 25, temperature 98.8, blood pressure 119/67, heart rate 104, 15, oxygen saturation 98% on room air.  Chest x-ray negative.  CT of head is negative for acute intracranial abnormalities.    EKG: I have personally reviewed.  Sinus rhythm, QTc 477, LAE,  low voltage.  LP was done with neurology.  Preliminary results bacterial meningitis versus carcinomatosis meningitis and antibiotics  were continued.  9/8: MRI brain done today with concern of miliary metastatic disease to the brain especially severe in the posterior fossa.  Oncology and palliative care was consulted.  Unfortunately he is not a candidate for any further medical intervention due to progression of his disease.  Patient and family decided to proceed with comfort and hospice care. Meningitis panel was negative so antibiotics were discontinued. Patient and family to decide about home with hospice versus hospice facility. EEG with seizure-like activity arising from right parieto-occipital region. Thorazine was added by nighttime provider for persistent hiccups.       Assessment and Plan: * Seizure ([place]) Consulted Dr. Rory Percy of neurology.  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 Meningitis panel negative. EEG with epileptiform waves -Continue with Keppra -Seizure precaution -As needed Ativan for seizure - hold baclofen since it decreases the threshold of seizure  Meningitis Lumbar puncture was performed.  Initial analysis that showed WBC 121, 150, red blood cell 799, 3615, glucose level less than 20.  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 At this moment, cannot completely rule out infectious meningitis.  Consulted Dr. Delaine Lame of ID, Dr. Rogue Bussing of oncology.  Meningitis panel negative.  He was initially started on broad-spectrum antibiotics which were later discontinued.  Severe sepsis ([place]) Sepsis ruled out.  No source of infection.  Patient  has SIRS criteria secondary to new onset seizures with worsening of metastatic brain disease. Antibiotics were discontinued  Tobacco abuse -Nicotine patch  HLD (hyperlipidemia) -crestor  Primary cancer of left upper lobe of lung ([place]) Patient seems to have  responded to immunotherapy, unfortunately immunotherapy had to be held due to intolerance.  Patient is currently taking prednisone.   MRI with [place] metastasis to brain. Unfortunately he is not a candidate for any further treatment. Palliative care was consulted and patient and family decided to proceed with hospice  GERD (gastroesophageal reflux disease) - Protonix  Subjective: Patient was little more awake and interactive when seen today.  Continued to have hiccups.  Multiple family members at bedside.  Physical Exam: Vitals:   06/29/22 2311 06/29/22 2342 06/30/22 0438 06/30/22 0728  BP:  102/67 (!) 147/129 109/61  Pulse:  70 77 81  Resp:  18 18 18   Temp:  98.6 F (37 C) 97.8 F (36.6 C) (!) 96.8 F (36 C)  TempSrc:      SpO2:  95% 96% 99%  Weight: 67.6 kg     Height: 5\' 7"  (1.702 m)      General.  Ill-appearing, frail gentleman, in no acute distress. Pulmonary.  Lungs clear bilaterally, normal respiratory effort. CV.  Regular rate and rhythm, no JVD, rub or murmur. Abdomen.  Soft, nontender, nondistended, BS positive. CNS.  Alert and oriented .  No focal neurologic deficit. Extremities.  No edema, no cyanosis, pulses intact and symmetrical.  Data Reviewed: Prior data reviewed.  Family Communication: Discussed with daughter along with multiple other family members at bedside.  Disposition: Status is: Inpatient Remains inpatient appropriate because: Severity of illness   Planned Discharge Destination: Home with hospice versus hospice facility  Time spent: 50 minutes  This record has been created using Systems analyst. Errors have been sought and corrected,but may not always be located. Such creation errors do not reflect on the standard of care.  Author: Lorella Nimrod, MD 06/30/2022 1:22 PM  For on call review [email or web address].

## 2022-06-30 NOTE — Consult Note (Signed)
[facility] CONSULT NOTE  Patient Care Team: Tracie Harrier, MD as PCP - General (Internal Medicine) Telford Nab, RN as Oncology Nurse Navigator Cammie Sickle, MD as Consulting Physician (Oncology)  CHIEF COMPLAINTS/PURPOSE OF CONSULTATION: lung cancer  Oncology History Overview Note  # . LUNG, LEFT UPPER LOBE; ENB-GUIDED TRANSBRONCHIAL FORCEPS BIOPSY: - SMALL CELL CARCINOMA.  [Dr.Aleskerov]  Comment:  Invasive carcinoma is present in two fragments, with crush artifact. The  cells are small, with scant cytoplasm, nuclear molding, and  inconspicuous nucleoli. There is necrosis.   Immunohistochemistry (IHC) was performed for further characterization.  The neoplastic cells are positive for CD56 with strong staining of over  90% of cells. They are negative for TTF-1 and p40.    IMPRESSION: 1. LEFT suprahilar mass constricting LEFT upper lobe bronchus consistent with primary bronchogenic carcinoma. 2. Ipsilateral mediastinal nodal metastasis. 3. Distant visceral metastasis to the LIVER and bilateral ADRENAL GLANDS. 4. Distant nodal metastasis to the LEFT external iliac node. 5. Multifocal hypermetabolic skeletal metastasis (approximately 40 lesions). Lesions are occult by CT imaging. 6. Focal activity beneath the skin posterior to the RIGHT ear. Differential includes nodal metastasis versus primary parotid neoplasm. Favor nodal metastasis. (Recommend attention on brain MRI workup).     Electronically Signed   By: Suzy Bouchard M.D.   On: 02/06/2022 13:00  # April 2023-brain MRI incidental/screening 4 mm x 2 cerebellar lesions.  # MAY 1st, 2023- carboo-Etop [d-1-3]; udenyca   Primary cancer of left upper lobe of lung ([place])  02/16/2022 Initial Diagnosis   Primary cancer of left upper lobe of lung ([employer])   02/16/2022 Cancer Staging   Staging form: Lung, AJCC 8th Edition - Clinical: Stage IVB (cT3, cN2, pM1c) - Signed by Cammie Sickle, MD  on 02/16/2022 Stage prefix: Initial diagnosis   02/20/2022 - 05/15/2022 Chemotherapy   Patient is on Treatment Plan : LUNG SCLC Carboplatin + Etoposide + Atezolizumab Induction q21d / Atezolizumab Maintenance q21d     02/20/2022 -  Chemotherapy   Patient is on Treatment Plan : LUNG SCLC Carboplatin + Etoposide + Atezolizumab Induction q21d x 4 cycles / Atezolizumab Maintenance q21d        HISTORY OF PRESENTING ILLNESS: Please note patient is sleepy; difficult to arouse.  Accompanied by daughter.   Jose Morse 51 y.o.  male with a history of multiple medical problems including including COPD; history of small cell lung cancer stage -4; is currently admitted to hospital for seizures.  Patient presented to the hospital for episodes of seizures/mental status changes.  Of note patient noted to have intractable hiccups over the last 3 to 4 days.  Given the lack of resolution ON baclofen patient was started on Thorazine.  Of note patient did not have any prior history of seizures.  With regards history of small cell lung cancer-patient diagnosed with stage 4 patient currently undergoing: Tecentriq/immunotherapy.   CT scan: Noncontrast negative for any acute process.  Patient was prompt evaluated by neurology-LP ordered.  CSF suggestive of involvement with malignant cells.  Patient has been started on Keppra.  Of note patient had ongoing sepsis needing Thorazine IV.  As per the patient's daughter-patient has been more coherent the last few hours.  However patient is currently resting/sleeping.  Review of Systems  Unable to perform ROS: Mental status change    MEDICAL HISTORY:  Past Medical History:  Diagnosis Date   History of kidney stones    Hypercholesteremia     SURGICAL HISTORY: Past  Surgical History:  Procedure Laterality Date   CHOLECYSTECTOMY     IR IMAGING GUIDED PORT INSERTION  02/23/2022   VIDEO BRONCHOSCOPY WITH ENDOBRONCHIAL NAVIGATION N/A 02/10/2022   Procedure: VIDEO  BRONCHOSCOPY WITH ENDOBRONCHIAL NAVIGATION;  Surgeon: Ottie Glazier, MD;  Location: [facility] ORS;  Service: Thoracic;  Laterality: N/A;   VIDEO BRONCHOSCOPY WITH ENDOBRONCHIAL ULTRASOUND N/A 02/10/2022   Procedure: VIDEO BRONCHOSCOPY WITH ENDOBRONCHIAL ULTRASOUND;  Surgeon: Ottie Glazier, MD;  Location: [facility] ORS;  Service: Thoracic;  Laterality: N/A;    SOCIAL HISTORY: Social History   Socioeconomic History   Marital status: Married    Spouse name: Not on file   Number of children: Not on file   Years of education: Not on file   Highest education level: Not on file  Occupational History   Not on file  Tobacco Use   Smoking status: Every Day    Packs/day: 0.50    Years: 34.00    Total pack years: 17.00    Types: Cigarettes   Smokeless tobacco: Never  Vaping Use   Vaping Use: Never used  Substance and Sexual Activity   Alcohol use: No   Drug use: Not Currently    Types: Marijuana   Sexual activity: Yes  Other Topics Concern   Not on file  Social History Narrative   Lives in [place]; with wife/son- 72 y [2023]; works in Architect. Smoker; no alcohol.    Social Determinants of Health   Financial Resource Strain: Not on file  Food Insecurity: Not on file  Transportation Needs: Not on file  Physical Activity: Not on file  Stress: Not on file  Social Connections: Not on file  Intimate Partner Violence: Not on file    FAMILY HISTORY: Family History  Problem Relation Age of Onset   Ovarian cancer Mother    Colon cancer Father    Diabetes Father     ALLERGIES:  is allergic to codeine.  MEDICATIONS:  Current Facility-Administered Medications  Medication Dose Route Frequency Provider Last Rate Last Admin   0.9 %  sodium chloride infusion   Intravenous Continuous Ivor Costa, MD   Stopped at 06/30/22 1900   albuterol (PROVENTIL) (2.5 MG/3ML) 0.083% nebulizer solution 2.5 mg  2.5 mg Nebulization Q4H PRN Ivor Costa, MD       Chlorhexidine Gluconate Cloth 2 % PADS 6 each   6 each Topical Daily Lorella Nimrod, MD       chlorproMAZINE (THORAZINE) 25 mg in sodium chloride 0.9 % 25 mL IVPB  25 mg Intravenous Q6H PRN Cammie Sickle, MD   Stopped at 06/30/22 1846   dextromethorphan-guaiFENesin (Amo DM) 30-600 MG per 12 hr tablet 1 tablet  1 tablet Oral BID PRN Ivor Costa, MD       levETIRAcetam (KEPPRA) IVPB 500 mg/100 mL premix  500 mg Intravenous Q12H Ivor Costa, MD   Stopped at 06/30/22 1636   lidocaine (LMX) 4 % cream   Topical Once Lorella Nimrod, MD       LORazepam (ATIVAN) injection 2 mg  2 mg Intravenous Q2H PRN Ivor Costa, MD       morphine (PF) 2 MG/ML injection 1 mg  1 mg Intravenous Q4H PRN Ivor Costa, MD   1 mg at 06/29/22 1817   oxyCODONE-acetaminophen (PERCOCET/ROXICET) 5-325 MG per tablet 1 tablet  1 tablet Oral Q6H PRN Noralee Space, RPH       And   oxyCODONE (Oxy IR/ROXICODONE) immediate release tablet 5 mg  5 mg Oral Q6H PRN  Chinita Greenland A, RPH       pantoprazole (PROTONIX) EC tablet 40 mg  40 mg Oral BID Ivor Costa, MD   40 mg at 06/30/22 2120   rosuvastatin (CRESTOR) tablet 5 mg  5 mg Oral Daily Ivor Costa, MD   5 mg at 06/30/22 0933   sodium chloride flush (NS) 0.9 % injection 10-40 mL  10-40 mL Intracatheter Q12H Lorella Nimrod, MD   10 mL at 06/30/22 2121   sodium chloride flush (NS) 0.9 % injection 10-40 mL  10-40 mL Intracatheter PRN Lorella Nimrod, MD       Facility-Administered Medications Ordered in Other Encounters  Medication Dose Route Frequency Provider Last Rate Last Admin   dexamethasone (DECADRON) 10 MG/ML injection            heparin lock flush 100 UNIT/ML injection             PHYSICAL EXAMINATION: ECOG PERFORMANCE STATUS:   Vitals:   06/30/22 2014 06/30/22 2333  BP: 118/68 105/67  Pulse: 70 90  Resp: 19 19  Temp: 97.9 F (36.6 C) 98.6 F (37 C)  SpO2: 100% 96%   Filed Weights   06/29/22 0349 06/29/22 2311  Weight: 145 lb (65.8 kg) 149 lb 0.5 oz (67.6 kg)   Patient resting in the bed; sleepy  difficult to arouse.  Physical Exam Vitals and nursing note reviewed.  HENT:     Head: Normocephalic and atraumatic.     Mouth/Throat:     Pharynx: Oropharynx is clear.  Eyes:     Extraocular Movements: Extraocular movements intact.     Pupils: Pupils are equal, round, and reactive to light.  Cardiovascular:     Rate and Rhythm: Normal rate and regular rhythm.  Pulmonary:     Comments: Decreased breath sounds bilaterally.  Abdominal:     Palpations: Abdomen is soft.  Musculoskeletal:        General: Normal range of motion.     Cervical back: Normal range of motion.  Skin:    General: Skin is warm.     LABORATORY DATA:  I have reviewed the data as listed Lab Results  Component Value Date   WBC 8.9 06/30/2022   HGB 11.8 (L) 06/30/2022   HCT 35.9 (L) 06/30/2022   MCV 92.8 06/30/2022   PLT 119 (L) 06/30/2022   Recent Labs    06/23/22 1555 06/28/22 0815 06/29/22 0358 06/30/22 0614  NA 139 138 135 137  K 3.9 3.7 3.6 3.8  CL 102 103 104 111  CO2 24 28 20* 17*  GLUCOSE 91 103* 125* 101*  BUN 31* 24* 24* 15  CREATININE 0.86 0.94 0.99 0.68  CALCIUM 9.5 9.5 8.4* 8.1*  GFRNONAA >60 >60 >60 >60  PROT 7.9 7.7 6.7  --   ALBUMIN 4.4 4.3 3.9  --   AST 23 20 27   --   ALT 64* 45* 39  --   ALKPHOS 69 62 59  --   BILITOT 1.4* 1.6* 1.8*  --     RADIOGRAPHIC STUDIES: I have personally reviewed the radiological images as listed and agreed with the findings in the report. MR Brain W and Wo Contrast  Result Date: 06/30/2022 CLINICAL DATA:  First-time seizure. History of metastatic small cell lung cancer EXAM: MRI HEAD WITHOUT AND WITH CONTRAST TECHNIQUE: Multiplanar, multiecho pulse sequences of the brain and surrounding structures were obtained without and with intravenous contrast. CONTRAST:  49mL GADAVIST GADOBUTROL 1 MMOL/ML IV SOLN COMPARISON:  04/17/2022 FINDINGS:  Brain: Innumerable small enhancing lesions throughout the brain, especially extensive in the bilateral cerebellum.  Lesions extend inferiorly to at least the medulla and cervicomedullary junction, assessment the cord is limited by motion degraded postcontrast images. Mild swelling symmetrically at the level of the cerebellum. No superimposed acute infarct, hemorrhage, hydrocephalus, or collection. Vascular: Major flow voids are preserved. Skull and upper cervical spine: Known high and posterior right parietal bone metastasis. Regressed appearance of C4 metastasis on precontrast T1 weighted imaging. Sinuses/Orbits: Negative Other: Intermittently motion degraded. IMPRESSION: Miliary metastatic disease to the brain especially severe in the posterior fossa. Electronically Signed   By: Jorje Guild M.D.   On: 06/30/2022 12:37   EEG adult  Result Date: 06/29/2022 Lora Havens, MD     06/29/2022  1:00 PM Patient Name: Jose Morse MRN: [id number] Epilepsy Attending: Lora Havens Referring Physician/Provider: Amie Portland, MD Date: 06/29/2022 Duration: 21.53 mins Patient history: 51 year old man with metastatic small cell lung cancer to the bone and brain, with new onset seizure. EEG to evaluate for seizure. Level of alertness: Awake, asleep AEDs during EEG study: LEV Technical aspects: This EEG study was done with scalp electrodes positioned according to the 10-20 International system of electrode placement. Electrical activity was reviewed with band pass filter of 1-70Hz , sensitivity of 7 uV/mm, display speed of 62mm/sec with a 60Hz  notched filter applied as appropriate. EEG data were recorded continuously and digitally stored.  Video monitoring was available and reviewed as appropriate. Description: The posterior dominant rhythm consists of 8 Hz activity of moderate voltage (25-35 uV) seen predominantly in posterior head regions, symmetric and reactive to eye opening and eye closing. Sleep was characterized by vertex waves, sleep spindles (12 to 14 Hz), maximal frontocentral region. Intermittent sharply contoured 2 to 3  Hz delta slowing as well as spikes  were noted in right parieto-occipital region. Hyperventilation and photic stimulation were not performed.   ABNORMALITY -Spike, right parieto-occipital region -Intermittent slow, right right parieto-occipital region IMPRESSION: This study showed evidence of epileptogenicity and cortical dysfunction arising from right parieto-occipital region. No seizures were seen throughout the recording. Priyanka Barbra Sarks   CT HEAD WO CONTRAST (5MM)  Result Date: 06/29/2022 CLINICAL DATA:  Possible seizure activity per family. Lung cancer patient with previous metastases to the brain. EXAM: CT HEAD WITHOUT CONTRAST TECHNIQUE: Contiguous axial images were obtained from the base of the skull through the vertex without intravenous contrast. RADIATION DOSE REDUCTION: This exam was performed according to the departmental dose-optimization program which includes automated exposure control, adjustment of the mA and/or kV according to patient size and/or use of iterative reconstruction technique. COMPARISON:  MRI brain 04/17/2022 FINDINGS: Technical note: The images are grainy due to beam hardening related to right lateral decubitus positioning of the head on exam. Brain: No evidence of acute infarction, hemorrhage, hydrocephalus, extra-axial collection or mass lesion/mass effect. Vascular: No hyperdense vessel or unexpected calcification. Skull: Negative for fracture or visible lesion. MR brain demonstrated a likely calvarial small metastasis in the posterior right parietal high convexity, but there is no corresponding CT finding. Sinuses/Orbits: There is chronic membrane thickening in the ethmoids and maxillary sinuses, retention cysts in the right maxillary sinus and sphenoid sinus. Rest of the sinuses and mastoid air cells are clear. Unremarkable orbital contents. Other: None. IMPRESSION: Grainy images due to positioning of the patient. No convincing acute intracranial CT abnormality or interval  changes. MRI brain demonstrated a small probable right posterosuperior parietal calvarial metastasis but there is no corresponding CT  finding. Electronically Signed   By: Telford Nab M.D.   On: 06/29/2022 06:22   DG Chest Port 1 View  Result Date: 06/29/2022 CLINICAL DATA:  Sepsis. EXAM: PORTABLE CHEST 1 VIEW COMPARISON:  02/10/2022 FINDINGS: There is a right chest wall port a catheter with tip at the cavoatrial junction. Heart size and mediastinal contours are unremarkable. No pleural effusion or edema identified. IMPRESSION: No active disease. Electronically Signed   By: Kerby Moors M.D.   On: 06/29/2022 39:40     51 year old male patient history of multiple medical problems including including COPD; history of small cell lung cancer stage -4; is currently admitted to hospital for seizures.  Lumbar puncture suggestive of meningeal carcinomatosis.  #Stage IV lung cancer small cell-currently on Tecentriq.  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  Status post evaluation with ID/neurology.  #CODE STATUS: DNR/DNI  Plan/recommendations:  #With regards to lung cancer-patient a poor candidate for any further therapies.  Do not expect patient's performance status to improve to consider any further systemic therapies.  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  This was discussed with patient' daughter by the bedside.  #Recommend hospice.  However as per the family patient seems more coherent later in the admission.  Daughter wants patient to be involved in the DNR/hospice admission process.  Discussed with Anselm Jungling kindly agrees to coordinate the care with patient's family.  Thank you Dr. Blaine Hamper for allowing me to participate in the care of your pleasant  patient. Please do not hesitate to contact me with questions or concerns in the interim.     Cammie Sickle, MD 06/30/2022 11:37 PM

## 2022-06-30 NOTE — Progress Notes (Signed)
[place] at [facility] [phone] [phone]   Name: Jose Morse Date: 06/30/2022 MRN: [id number]  DOB: April 19, 1971  Patient Care Team: Tracie Harrier, MD as PCP - General (Internal Medicine) Telford Nab, RN as Oncology Nurse Navigator Cammie Sickle, MD as Consulting Physician (Oncology)    REASON FOR CONSULTATION: Jose Morse is a 51 y.o. 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  Patient has been on treatment with carbo/etoposide/atezo but immunotherapy recently on hold due to joint pains.  CT April 17, 2022 showed significant response to therapy.  Patient was admitted to the hospital 06/29/2022 with seizures.  CSF positive for malignant cells.  Palliative care was consulted to help address goals..    CODE STATUS: DNR  PAST MEDICAL HISTORY: Past Medical History:  Diagnosis Date   History of kidney stones    Hypercholesteremia     PAST SURGICAL HISTORY:  Past Surgical History:  Procedure Laterality Date   CHOLECYSTECTOMY     IR IMAGING GUIDED PORT INSERTION  02/23/2022   VIDEO BRONCHOSCOPY WITH ENDOBRONCHIAL NAVIGATION N/A 02/10/2022   Procedure: VIDEO BRONCHOSCOPY WITH ENDOBRONCHIAL NAVIGATION;  Surgeon: Ottie Glazier, MD;  Location: [facility] ORS;  Service: Thoracic;  Laterality: N/A;   VIDEO BRONCHOSCOPY WITH ENDOBRONCHIAL ULTRASOUND N/A 02/10/2022   Procedure: VIDEO BRONCHOSCOPY WITH ENDOBRONCHIAL ULTRASOUND;  Surgeon: Ottie Glazier, MD;  Location: [facility] ORS;  Service: Thoracic;  Laterality: N/A;    HEMATOLOGY/ONCOLOGY HISTORY:  Oncology History Overview Note  # . LUNG, LEFT UPPER LOBE; ENB-GUIDED TRANSBRONCHIAL FORCEPS BIOPSY: - SMALL CELL CARCINOMA.  [Dr.Aleskerov]  Comment:  Invasive carcinoma is present in two fragments, with crush artifact. The  cells are small, with  scant cytoplasm, nuclear molding, and  inconspicuous nucleoli. There is necrosis.   Immunohistochemistry (IHC) was performed for further characterization.  The neoplastic cells are positive for CD56 with strong staining of over  90% of cells. They are negative for TTF-1 and p40.    IMPRESSION: 1. LEFT suprahilar mass constricting LEFT upper lobe bronchus consistent with primary bronchogenic carcinoma. 2. Ipsilateral mediastinal nodal metastasis. 3. Distant visceral metastasis to the LIVER and bilateral ADRENAL GLANDS. 4. Distant nodal metastasis to the LEFT external iliac node. 5. Multifocal hypermetabolic skeletal metastasis (approximately 40 lesions). Lesions are occult by CT imaging. 6. Focal activity beneath the skin posterior to the RIGHT ear. Differential includes nodal metastasis versus primary parotid neoplasm. Favor nodal metastasis. (Recommend attention on brain MRI workup).     Electronically Signed   By: Suzy Bouchard M.D.   On: 02/06/2022 13:00  # April 2023-brain MRI incidental/screening 4 mm x 2 cerebellar lesions.  # MAY 1st, 2023- carboo-Etop [d-1-3]; udenyca   Primary cancer of left upper lobe of lung ([place])  02/16/2022 Initial Diagnosis   Primary cancer of left upper lobe of lung ([place])   02/16/2022 Cancer Staging   Staging form: Lung, AJCC 8th Edition - Clinical: Stage IVB (cT3, cN2, pM1c) - Signed by Cammie Sickle, MD on 02/16/2022 Stage prefix: Initial diagnosis   02/20/2022 - 05/15/2022 Chemotherapy   Patient is on Treatment Plan : LUNG SCLC Carboplatin + Etoposide + Atezolizumab Induction q21d / Atezolizumab Maintenance q21d     02/20/2022 -  Chemotherapy   Patient is on Treatment Plan : LUNG SCLC Carboplatin + Etoposide + Atezolizumab Induction q21d x 4 cycles / Atezolizumab Maintenance q21d  ALLERGIES:  is allergic to codeine.  MEDICATIONS:  Current Facility-Administered Medications  Medication Dose Route Frequency Provider Last Rate  Last Admin   0.9 %  sodium chloride infusion   Intravenous Continuous Ivor Costa, MD 125 mL/hr at 06/30/22 0535 New Bag at 06/30/22 0535   albuterol (PROVENTIL) (2.5 MG/3ML) 0.083% nebulizer solution 2.5 mg  2.5 mg Nebulization Q4H PRN Ivor Costa, MD       chlorproMAZINE (THORAZINE) 12.5 mg in sodium chloride 0.9 % 25 mL IVPB  12.5 mg Intravenous Once Charlaine Dalton R, MD 50 mL/hr at 06/30/22 1058 12.5 mg at 06/30/22 1058   chlorproMAZINE (THORAZINE) 25 mg in sodium chloride 0.9 % 25 mL IVPB  25 mg Intravenous Q6H PRN Cammie Sickle, MD       dexamethasone (DECADRON) injection 10 mg  10 mg Intravenous Q6H Amie Portland, MD   10 mg at 06/30/22 0926   dextromethorphan-guaiFENesin ([place] DM) 30-600 MG per 12 hr tablet 1 tablet  1 tablet Oral BID PRN Ivor Costa, MD       levETIRAcetam (KEPPRA) IVPB 500 mg/100 mL premix  500 mg Intravenous Q12H Ivor Costa, MD   Stopped at 06/30/22 0441   LORazepam (ATIVAN) injection 2 mg  2 mg Intravenous Q2H PRN Ivor Costa, MD       morphine (PF) 2 MG/ML injection 1 mg  1 mg Intravenous Q4H PRN Ivor Costa, MD   1 mg at 06/29/22 1817   nicotine (NICODERM CQ - dosed in mg/24 hours) patch 21 mg  21 mg Transdermal Daily Ivor Costa, MD       oxyCODONE-acetaminophen (PERCOCET/ROXICET) 5-325 MG per tablet 1 tablet  1 tablet Oral Q6H PRN Noralee Space, RPH       And   oxyCODONE (Oxy IR/ROXICODONE) immediate release tablet 5 mg  5 mg Oral Q6H PRN Noralee Space, RPH       pantoprazole (PROTONIX) EC tablet 40 mg  40 mg Oral BID Ivor Costa, MD   40 mg at 06/30/22 0932   rosuvastatin (CRESTOR) tablet 5 mg  5 mg Oral Daily Ivor Costa, MD   5 mg at 06/30/22 4193   Facility-Administered Medications Ordered in Other Encounters  Medication Dose Route Frequency Provider Last Rate Last Admin   dexamethasone (DECADRON) 10 MG/ML injection            heparin lock flush 100 UNIT/ML injection             VITAL SIGNS: BP 109/61 (BP Location: Right Wrist)   Pulse  81   Temp (!) 96.8 F (36 C)   Resp 18   Ht 5\' 7"  (1.702 m)   Wt 149 lb 0.5 oz (67.6 kg)   SpO2 99%   BMI 23.34 kg/m  Filed Weights   06/29/22 0349 06/29/22 2311  Weight: 145 lb (65.8 kg) 149 lb 0.5 oz (67.6 kg)    Estimated body mass index is 23.34 kg/m as calculated from the following:   Height as of this encounter: 5\' 7"  (1.702 m).   Weight as of this encounter: 149 lb 0.5 oz (67.6 kg).  LABS: CBC:    Component Value Date/Time   WBC 8.9 06/30/2022 0614   HGB 11.8 (L) 06/30/2022 0614   HCT 35.9 (L) 06/30/2022 0614   PLT 119 (L) 06/30/2022 0614   MCV 92.8 06/30/2022 0614   NEUTROABS 13.2 (H) 06/29/2022 0358   LYMPHSABS 0.9 06/29/2022 0358   MONOABS 0.8 06/29/2022 0358   EOSABS 0.1 06/29/2022  0358   BASOSABS 0.0 2022/07/10 0358   Comprehensive Metabolic Panel:    Component Value Date/Time   NA 137 06/30/2022 0614   K 3.8 06/30/2022 0614   CL 111 06/30/2022 0614   CO2 17 (L) 06/30/2022 0614   BUN 15 06/30/2022 0614   CREATININE 0.68 06/30/2022 0614   GLUCOSE 101 (H) 06/30/2022 0614   CALCIUM 8.1 (L) 06/30/2022 0614   AST 27 07-10-22 0358   ALT 39 07/10/2022 0358   ALKPHOS 59 07-10-22 0358   BILITOT 1.8 (H) 2022-07-10 0358   PROT 6.7 07/10/2022 0358   ALBUMIN 3.9 2022-07-10 0358    RADIOGRAPHIC STUDIES: EEG adult  Result Date: 07-10-22 Lora Havens, MD     07/10/22  1:00 PM Patient Name: Jose Morse MRN: [id number] Epilepsy Attending: Lora Havens Referring Physician/Provider: Amie Portland, MD Date: 10-Jul-2022 Duration: 21.53 mins Patient history: 51 year old man with metastatic small cell lung cancer to the bone and brain, with new onset seizure. EEG to evaluate for seizure. Level of alertness: Awake, asleep AEDs during EEG study: LEV Technical aspects: This EEG study was done with scalp electrodes positioned according to the 10-20 International system of electrode placement. Electrical activity was reviewed with band pass filter of 1-70Hz ,  sensitivity of 7 uV/mm, display speed of 58mm/sec with a 60Hz  notched filter applied as appropriate. EEG data were recorded continuously and digitally stored.  Video monitoring was available and reviewed as appropriate. Description: The posterior dominant rhythm consists of 8 Hz activity of moderate voltage (25-35 uV) seen predominantly in posterior head regions, symmetric and reactive to eye opening and eye closing. Sleep was characterized by vertex waves, sleep spindles (12 to 14 Hz), maximal frontocentral region. Intermittent sharply contoured 2 to 3 Hz delta slowing as well as spikes  were noted in right parieto-occipital region. Hyperventilation and photic stimulation were not performed.   ABNORMALITY -Spike, right parieto-occipital region -Intermittent slow, right right parieto-occipital region IMPRESSION: This study showed evidence of epileptogenicity and cortical dysfunction arising from right parieto-occipital region. No seizures were seen throughout the recording. Priyanka Barbra Sarks   CT HEAD WO CONTRAST (5MM)  Result Date: 07/10/2022 CLINICAL DATA:  Possible seizure activity per family. Lung cancer patient with previous metastases to the brain. EXAM: CT HEAD WITHOUT CONTRAST TECHNIQUE: Contiguous axial images were obtained from the base of the skull through the vertex without intravenous contrast. RADIATION DOSE REDUCTION: This exam was performed according to the departmental dose-optimization program which includes automated exposure control, adjustment of the mA and/or kV according to patient size and/or use of iterative reconstruction technique. COMPARISON:  MRI brain 04/17/2022 FINDINGS: Technical note: The images are grainy due to beam hardening related to right lateral decubitus positioning of the head on exam. Brain: No evidence of acute infarction, hemorrhage, hydrocephalus, extra-axial collection or mass lesion/mass effect. Vascular: No hyperdense vessel or unexpected calcification. Skull:  Negative for fracture or visible lesion. MR brain demonstrated a likely calvarial small metastasis in the posterior right parietal high convexity, but there is no corresponding CT finding. Sinuses/Orbits: There is chronic membrane thickening in the ethmoids and maxillary sinuses, retention cysts in the right maxillary sinus and sphenoid sinus. Rest of the sinuses and mastoid air cells are clear. Unremarkable orbital contents. Other: None. IMPRESSION: Grainy images due to positioning of the patient. No convincing acute intracranial CT abnormality or interval changes. 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 Electronically Signed   By:  Telford Nab M.D.   On: 06/29/2022 06:22   DG Chest Port 1 View  Result Date: 06/29/2022 CLINICAL DATA:  Sepsis. EXAM: PORTABLE CHEST 1 VIEW COMPARISON:  02/10/2022 FINDINGS: There is a right chest wall port a catheter with tip at the cavoatrial junction. Heart size and mediastinal contours are unremarkable. No pleural effusion or edema identified. IMPRESSION: No active disease. Electronically Signed   By: Kerby Moors M.D.   On: 06/29/2022 04:55    PERFORMANCE STATUS (ECOG) : 4 - Bedbound  Review of Systems Unless otherwise noted, a complete review of systems is negative.  Physical Exam General: Frail appearing Pulmonary: Unlabored Extremities: no edema, no joint deformities Skin: no rashes Neurological: Weakness but otherwise nonfocal  IMPRESSION: Follow-up visit.  Patient more alert and is oriented.  Discussed the nature of his hospitalization including results of work-up.  Patient verbalized understanding that there is not much more that can be done from an oncology standpoint and that comfort measures are being recommended.  He seemed agreement with hospice but wanted some time to think about inpatient hospice facility versus going home with hospice.  Hospice liaison  notified and will coordinate with family regarding ultimate decision.  Discussed CODE STATUS with patient per daughter's request.  Patient verbalized agreement DNR/DNI.  PLAN: -Patient family considering hospice at home versus hospice facility -DNR/DNI   Time Total: 25 minutes  Visit consisted of counseling and education dealing with the complex and emotionally intense issues of symptom management and palliative care in the setting of serious and potentially life-threatening illness.Greater than 50%  of this time was spent counseling and coordinating care related to the above assessment and plan.  Signed by: Altha Harm, PhD, NP-C

## 2022-06-30 NOTE — Hospital Course (Addendum)
Taken from H&P and prior notes.  Jose Morse is a 51 y.o. 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   Pt was on immunotherapy for SCLC which is held due to joint pains, currently on on prednisone. Pt had MRI brain from 04/14/22, which showed calvarial mets, but has resolution of lymphadenopathy and cerebellar lesions compared to MRI of brain on 02/16/22. Patient presents to the ED for evaluation of first-time generalized seizure. Per family, pt was noted to have 1 episode of seizure at home at about 2-3 AM, described as generalized tonic-clonic activity, lasted for a few minutes. Pt has been confused and not able to answer any questions.  He moves all extremities, no facial droop or slurred speech when I saw pt in ED. Per family, patient does not seem to have chest pain or abdominal pain.  No active nausea, vomiting, diarrhea or respiratory distress noted.  Patient has mild dry cough per his son.  Did not complain symptoms of UTI.   Pt was loaded with 1500 mg of Keppra in ED.  No more seizure in ED.   Data reviewed independently and ED Course: pt was found to have WBC 15.1, lactic acid 4.0 --> 1.8, procalcitonin < 01.0, negative urinalysis, negative COVID PCR, ammonia level 11, GFR> 60, INR 1.1, PTT 25, temperature 98.8, blood pressure 119/67, heart rate 104, 15, oxygen saturation 98% on room air.  Chest x-ray negative.  CT of head is negative for acute intracranial abnormalities.    EKG: I have personally reviewed.  Sinus rhythm, QTc 477, LAE,  low voltage.  LP was done with neurology.  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  9/8: MRI brain done today with concern of miliary metastatic disease to the brain especially severe in the posterior fossa.  Oncology and palliative care  was consulted.  Unfortunately he is not a candidate for any further medical intervention due to progression of his disease.  Patient and family decided to proceed with comfort and hospice care. Meningitis panel was negative so antibiotics were discontinued. Patient and family to decide about home with hospice versus hospice facility. EEG with seizure-like activity arising from right parieto-occipital region. Thorazine was added by nighttime provider for persistent hiccups.  9/9: Patient was resting comfortably when seen today but having intermittent body shakiness, can be some underlying seizure with this extensive brain mets.  We increased the dose of Keppra and also added 0.5 mg of Klonopin twice daily which can be titrated up by hospice facility as needed.  Patient has been accepted at hospice facility where he is being discharged for end-of-life care.  Very poor prognosis.  Multiple family members which include mother, sister and daughter at bedside and understand the trajectory of his disease.

## 2022-06-30 NOTE — Progress Notes (Signed)
Cross Cover Thorazine ordered for continual hiccups

## 2022-07-01 DIAGNOSIS — E785 Hyperlipidemia, unspecified: Secondary | ICD-10-CM | POA: Diagnosis not present

## 2022-07-01 LAB — GLUCOSE, CAPILLARY
Glucose-Capillary: 85 mg/dL (ref 70–99)
Glucose-Capillary: 91 mg/dL (ref 70–99)

## 2022-07-01 MED ORDER — LORAZEPAM 2 MG/ML IJ SOLN: 2.0000 mg | INTRAMUSCULAR | 0 refills | Status: AC | PRN

## 2022-07-01 MED ORDER — CLONAZEPAM 0.5 MG PO TBDP: 0.5000 mg | ORAL_TABLET | Freq: Two times a day (BID) | ORAL | 0 refills | Status: AC

## 2022-07-01 MED ORDER — SODIUM CHLORIDE 0.9 % IV SOLN: 750.0000 mg | Freq: Two times a day (BID) | INTRAVENOUS | Status: AC

## 2022-07-01 MED ORDER — SODIUM CHLORIDE 0.9 % IV SOLN
750.0000 mg | Freq: Two times a day (BID) | INTRAVENOUS | Status: DC
  Filled 2022-07-01: qty 7.5

## 2022-07-01 MED ORDER — CLONAZEPAM 0.25 MG PO TBDP
0.5000 mg | ORAL_TABLET | Freq: Two times a day (BID) | ORAL | Status: DC
Start: 2022-07-01 — End: 2022-07-01
  Administered 2022-07-01: 0.5 mg via ORAL
  Filled 2022-07-01: qty 2

## 2022-07-01 NOTE — TOC Transition Note (Signed)
Transition of Care [facility]) - CM/SW Discharge Note   Patient Details  Name: Jose Morse MRN: [id number] Date of Birth: 02-18-1971  [facility] CM/SW Contact:  Izola Price, RN Phone Number: 07/01/2022, 1:46 PM   Clinical Narrative:  07/01/22: Patient has a bed at [facility] residential hospice home. Transferring via [facility]. Forms printed to unit and Hospice liaison called [facility]. Number or report given to RN by hospice liaison as well.  Liaison working with family. Simmie Davies RN CM     Final next level of care: Hospice Medical Facility Barriers to Discharge: Barriers Resolved   Patient Goals and CMS Choice     Choice offered to / list presented to : Adult Children  Discharge Placement                    Patient and family notified of of transfer: 07/01/22 (Per hospice liaison.)  Discharge Plan and Services                DME Arranged: N/A DME Agency: NA       HH Arranged: NA          Social Determinants of Health (SDOH) Interventions     Readmission Risk Interventions     No data to display

## 2022-07-01 NOTE — Plan of Care (Signed)

## 2022-07-01 NOTE — Discharge Summary (Signed)
Physician Discharge Summary   Patient: Jose Morse MRN: [id number] DOB: [birth year]  Admit date:     06/29/2022  Discharge date: 07/01/22  Discharge Physician: Lorella Nimrod   PCP: Tracie Harrier, MD   Recommendations at discharge:  Patient is being discharged to hospice facility  Discharge Diagnoses: Principal Problem:   Seizure [facility]) Active Problems:   Meningitis   Severe sepsis ([place])   Tobacco abuse   HLD (hyperlipidemia)   Primary cancer of left upper lobe of lung ([place])   GERD (gastroesophageal reflux disease)   Hospital Course: Taken from H&P and prior notes.  Jose Morse is a 51 y.o. 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   Pt was on immunotherapy for SCLC which is held due to joint pains, currently on on prednisone. Pt had MRI brain from 04/14/22, which showed calvarial mets, but has resolution of lymphadenopathy and cerebellar lesions compared to MRI of brain on 02/16/22. Patient presents to the ED for evaluation of first-time generalized seizure. Per family, pt was noted to have 1 episode of seizure at home at about 2-3 AM, described as generalized tonic-clonic activity, lasted for a few minutes. Pt has been confused and not able to answer any questions.  He moves all extremities, no facial droop or slurred speech when I saw pt in ED. Per family, patient does not seem to have chest pain or abdominal pain.  No active nausea, vomiting, diarrhea or respiratory distress noted.  Patient has mild dry cough per his son.  Did not complain symptoms of UTI.   Pt was loaded with 1500 mg of Keppra in ED.  No more seizure in ED.   Data reviewed independently and ED Course: pt was found to have WBC 15.1, lactic acid 4.0 --> 1.8, procalcitonin < 01.0, negative urinalysis, negative COVID PCR, ammonia level 11, GFR> 60, INR 1.1,  PTT 25, temperature 98.8, blood pressure 119/67, heart rate 104, 15, oxygen saturation 98% on room air.  Chest x-ray negative.  CT of head is negative for acute intracranial abnormalities.    EKG: I have personally reviewed.  Sinus rhythm, QTc 477, LAE,  low voltage.  LP was done with neurology.  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  9/8: MRI brain done today with concern of miliary metastatic disease to the brain especially severe in the posterior fossa.  Oncology and palliative care was consulted.  Unfortunately he is not a candidate for any further medical intervention due to progression of his disease.  Patient and family decided to proceed with comfort and hospice care. Meningitis panel was negative so antibiotics were discontinued. Patient and family to decide about home with hospice versus hospice facility. EEG with seizure-like activity arising from right parieto-occipital region. Thorazine was added by nighttime provider for persistent hiccups.  9/9: Patient was resting comfortably when seen today but having intermittent body shakiness, can be some underlying seizure with this extensive brain mets.  We increased the dose of Keppra and also added 0.5 mg of Klonopin twice daily which can be titrated up by hospice facility as needed.  Patient has been accepted at hospice facility where he is being discharged for end-of-life care.  Very poor prognosis.  Multiple family members which include mother, sister and daughter at bedside and understand the trajectory of his disease.    Assessment and Plan: *  Seizure ([place]) Consulted Dr. Rory Percy of neurology.  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 Meningitis panel negative. EEG with epileptiform waves -Continue with Keppra -Seizure precaution -As needed Ativan for seizure - hold baclofen since it decreases the threshold of  seizure  Meningitis Lumbar puncture was performed.  Initial analysis that showed WBC 121, 150, red blood cell 799, 3615, glucose level less than 20.  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 At this moment, cannot completely rule out infectious meningitis.  Consulted Dr. Delaine Lame of ID, Dr. Rogue Bussing of oncology.  Meningitis panel negative.  He was initially started on broad-spectrum antibiotics which were later discontinued.  Severe sepsis ([place]) Sepsis ruled out.  No source of infection.  Patient has SIRS criteria secondary to new onset seizures with worsening of metastatic brain disease. Antibiotics were discontinued  Tobacco abuse -Nicotine patch  HLD (hyperlipidemia) -crestor  Primary cancer of left upper lobe of lung ([place]) Patient seems to have responded to immunotherapy, unfortunately immunotherapy had to be held due to intolerance.  Patient is currently taking prednisone.   MRI with [place] metastasis to brain. Unfortunately he is not a candidate for any further treatment. Palliative care was consulted and patient and family decided to proceed with hospice  GERD (gastroesophageal reflux disease) - Protonix   Consultants: Neurology, oncology Procedures performed: EEG Disposition: Hospice care Diet recommendation:  Discharge Diet Orders (From admission, onward)     Start     Ordered   07/01/22 0000  Diet - low sodium heart healthy        07/01/22 1302           Regular diet DISCHARGE MEDICATION: Allergies as of 07/01/2022       Reactions   Codeine Nausea Only        Medication List     STOP taking these medications    baclofen 10 MG tablet Commonly known as: LIORESAL   Breztri Aerosphere 160-9-4.8 MCG/ACT Aero Generic drug: Budeson-Glycopyrrol-Formoterol   metoCLOPramide 10 MG  tablet Commonly known as: REGLAN   ondansetron 8 MG disintegrating tablet Commonly known as: ZOFRAN-ODT   ondansetron 8 MG tablet Commonly known as: ZOFRAN   potassium chloride SA 20 MEQ tablet Commonly known as: Klor-Con M20   predniSONE 20 MG tablet Commonly known as: DELTASONE   rosuvastatin 5 MG tablet Commonly known as: CRESTOR       TAKE these medications    albuterol 108 (90 Base) MCG/ACT inhaler Commonly known as: Proventil HFA Inhale 2 puffs into the lungs every 4 (four) hours as needed for wheezing or shortness of breath.   chlorproMAZINE 25 MG tablet Commonly known as: THORAZINE Take 1 tablet (25 mg total) by mouth 3 (three) times daily.   clonazePAM 0.5 MG disintegrating tablet Commonly known as: KLONOPIN Take 1 tablet (0.5 mg total) by mouth 2 (two) times daily.   levETIRAcetam 750 mg in sodium chloride 0.9 % 100 mL Inject 750 mg into the vein every 12 (twelve) hours.   lidocaine-prilocaine cream Commonly known as: EMLA Apply on the port. 30 -45 min  prior to port access.   LORazepam 2 MG/ML injection Commonly known as: ATIVAN Inject 1 mL (2 mg total) into the vein every 2 (two) hours as needed for seizure.   oxyCODONE-acetaminophen 10-325 MG tablet Commonly known as: Percocet Take 1 tablet by mouth  every 6 (six) hours as needed for pain.   pantoprazole 40 MG tablet Commonly known as: Protonix Take 1 tablet (40 mg total) by mouth 2 (two) times daily. 1 hour prior to breakfast/dinner.        Discharge Exam: Filed Weights   06/29/22 0349 06/29/22 2311  Weight: 65.8 kg 67.6 kg   General.  Frail gentleman,   in no acute distress. Pulmonary.  Lungs clear bilaterally, normal respiratory effort. CV.  Regular rate and rhythm, no JVD, rub or murmur. Abdomen.  Soft, nontender, nondistended, BS positive. CNS.  Little somnolent, arousable, intermittent jerking of limbs. Extremities.  No edema, no cyanosis, pulses intact and  symmetrical.  Condition at discharge: poor  The results of significant diagnostics from this hospitalization (including imaging, microbiology, ancillary and laboratory) are listed below for reference.   Imaging Studies: MR Brain W and Wo Contrast  Result Date: 06/30/2022 CLINICAL DATA:  First-time seizure. History of metastatic small cell lung cancer EXAM: MRI HEAD WITHOUT AND WITH CONTRAST TECHNIQUE: Multiplanar, multiecho pulse sequences of the brain and surrounding structures were obtained without and with intravenous contrast. CONTRAST:  12mL GADAVIST GADOBUTROL 1 MMOL/ML IV SOLN COMPARISON:  04/17/2022 FINDINGS: Brain: Innumerable small enhancing lesions throughout the brain, especially extensive in the bilateral cerebellum. Lesions extend inferiorly to at least the medulla and cervicomedullary junction, assessment the cord is limited by motion degraded postcontrast images. Mild swelling symmetrically at the level of the cerebellum. No superimposed acute infarct, hemorrhage, hydrocephalus, or collection. Vascular: Major flow voids are preserved. Skull and upper cervical spine: Known high and posterior right parietal bone metastasis. Regressed appearance of C4 metastasis on precontrast T1 weighted imaging. Sinuses/Orbits: Negative Other: Intermittently motion degraded. IMPRESSION: Miliary metastatic disease to the brain especially severe in the posterior fossa. Electronically Signed   By: Jorje Guild M.D.   On: 06/30/2022 12:37   EEG adult  Result Date: 06/29/2022 Lora Havens, MD     06/29/2022  1:00 PM Patient Name: Jose Morse MRN: [id number] Epilepsy Attending: Lora Havens Referring Physician/Provider: Amie Portland, MD Date: 06/29/2022 Duration: 21.53 mins Patient history: 51 year old man with metastatic small cell lung cancer to the bone and brain, with new onset seizure. EEG to evaluate for seizure. Level of alertness: Awake, asleep AEDs during EEG study: LEV Technical aspects:  This EEG study was done with scalp electrodes positioned according to the 10-20 International system of electrode placement. Electrical activity was reviewed with band pass filter of 1-70Hz , sensitivity of 7 uV/mm, display speed of 41mm/sec with a 60Hz  notched filter applied as appropriate. EEG data were recorded continuously and digitally stored.  Video monitoring was available and reviewed as appropriate. Description: The posterior dominant rhythm consists of 8 Hz activity of moderate voltage (25-35 uV) seen predominantly in posterior head regions, symmetric and reactive to eye opening and eye closing. Sleep was characterized by vertex waves, sleep spindles (12 to 14 Hz), maximal frontocentral region. Intermittent sharply contoured 2 to 3 Hz delta slowing as well as spikes  were noted in right parieto-occipital region. Hyperventilation and photic stimulation were not performed.   ABNORMALITY -Spike, right parieto-occipital region -Intermittent slow, right right parieto-occipital region IMPRESSION: This study showed evidence of epileptogenicity and cortical dysfunction arising from right parieto-occipital region. No seizures were seen throughout the recording. Priyanka Barbra Sarks   CT HEAD WO CONTRAST (5MM)  Result Date: 06/29/2022 CLINICAL DATA:  Possible seizure activity per family. Lung cancer patient with previous metastases to the brain. EXAM: CT HEAD  WITHOUT CONTRAST TECHNIQUE: Contiguous axial images were obtained from the base of the skull through the vertex without intravenous contrast. RADIATION DOSE REDUCTION: This exam was performed according to the departmental dose-optimization program which includes automated exposure control, adjustment of the mA and/or kV according to patient size and/or use of iterative reconstruction technique. COMPARISON:  MRI brain 04/17/2022 FINDINGS: Technical note: The images are grainy due to beam hardening related to right lateral decubitus positioning of the head on exam.  Brain: No evidence of acute infarction, hemorrhage, hydrocephalus, extra-axial collection or mass lesion/mass effect. Vascular: No hyperdense vessel or unexpected calcification. Skull: Negative for fracture or visible lesion. MR brain demonstrated a likely calvarial small metastasis in the posterior right parietal high convexity, but there is no corresponding CT finding. Sinuses/Orbits: There is chronic membrane thickening in the ethmoids and maxillary sinuses, retention cysts in the right maxillary sinus and sphenoid sinus. Rest of the sinuses and mastoid air cells are clear. Unremarkable orbital contents. Other: None. IMPRESSION: Grainy images due to positioning of the patient. No convincing acute intracranial CT abnormality or interval changes. 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 Electronically Signed   By: Telford Nab M.D.   On: 06/29/2022 06:22   DG Chest Port 1 View  Result Date: 06/29/2022 CLINICAL DATA:  Sepsis. EXAM: PORTABLE CHEST 1 VIEW COMPARISON:  02/10/2022 FINDINGS: There is a right chest wall port a catheter with tip at the cavoatrial junction. Heart size and mediastinal contours are unremarkable. No pleural effusion or edema identified. IMPRESSION: No active disease. Electronically Signed   By: Kerby Moors M.D.   On: 06/29/2022 04:55    Microbiology: Results for orders placed or performed during the hospital encounter of 06/29/22  Urine Culture     Status: None   Collection Time: 06/29/22  4:18 AM   Specimen: Urine, Random  Result Value Ref Range Status   Specimen Description   Final    URINE, RANDOM Performed at [facility], [address], [place], [place] [address]    Special Requests   Final    NONE Performed at [facility], [address], [place], [place] [address]    Culture   Final    NO GROWTH Performed at [facility], [place] [address] [place], [place] [address]    Report Status 06/30/2022 FINAL  Final  SARS Coronavirus 2 by RT PCR (hospital order, performed in [facility] hospital lab) *cepheid single result test* Anterior Nasal Swab     Status: None   Collection Time: 06/29/22  4:18 AM   Specimen: Anterior Nasal Swab  Result Value Ref Range Status   SARS Coronavirus 2 by RT PCR NEGATIVE NEGATIVE Final    Comment: (NOTE) SARS-CoV-2 target nucleic acids are NOT DETECTED.  The SARS-CoV-2 RNA is generally detectable in upper and lower respiratory specimens during the acute phase of infection. The lowest concentration of SARS-CoV-2 viral copies this assay can detect is 250 copies / mL. A negative result does not preclude SARS-CoV-2 infection and should not be used as the sole basis for treatment or other patient management decisions.  A negative result may occur with improper specimen collection / handling, submission of specimen other than nasopharyngeal swab, presence of viral mutation(s) within the areas targeted by this assay, and inadequate number of viral copies (<250 copies / mL). A negative result must be combined with clinical observations, patient history, and epidemiological information.  Fact Sheet for Patients:   https://www.patel.info/  Fact Sheet for Healthcare Providers: https://hall.com/  This test is not yet approved or  cleared by the [place] FDA and has been authorized for detection and/or diagnosis of SARS-CoV-2 by FDA under an Emergency Use Authorization (EUA).  This EUA will remain in effect (meaning this test can be used) for the duration of the COVID-19 declaration under Section 564(b)(1) of the Act, 21 U.S.C. section 360bbb-3(b)(1), unless the authorization is terminated or revoked sooner.  Performed at [facility], [address] [place], [place] [address]   Blood Culture (routine x 2)     Status: None (Preliminary result)    Collection Time: 06/29/22  4:22 AM   Specimen: BLOOD  Result Value Ref Range Status   Specimen Description BLOOD RIGHT ASSIST CONTROL  Final   Special Requests   Final    BOTTLES DRAWN AEROBIC AND ANAEROBIC Blood Culture adequate volume   Culture   Final    NO GROWTH 2 DAYS Performed at [facility], [address] [place], [place] [address]    Report Status PENDING  Incomplete  CSF culture w Gram Stain     Status: None (Preliminary result)   Collection Time: 06/29/22 11:00 AM   Specimen: CSF; Cerebrospinal Fluid  Result Value Ref Range Status   Specimen Description   Final    CSF Performed at [facility], [address] [place], [place] [address]    Special Requests   Final    Immunocompromised Performed at [facility], [address] [place], [place] [address]    Gram Stain   Final    WBC SEEN RED BLOOD CELLS NO ORGANISMS SEEN Performed at [facility], [address], [place], [place] [address]    Culture   Final    NO GROWTH 2 DAYS Performed at [facility], [place] [address], [place], [place] [address]    Report Status PENDING  Incomplete  Culture, fungus without smear     Status: None (Preliminary result)   Collection Time: 06/29/22 11:00 AM   Specimen: CSF; Other  Result Value Ref Range Status   Specimen Description   Final    CSF Performed at [facility], [address], [place], [place] [address]    Special Requests   Final    NONE Performed at [facility], [address] [place], [place] [address]    Culture   Final    NO FUNGUS ISOLATED AFTER 2 DAYS Performed at [facility], [place] [address] [place], [place] [address]    Report Status PENDING  Incomplete  Blood Culture (routine x 2)     Status: None (Preliminary result)   Collection Time: 06/29/22 10:39 PM   Specimen: BLOOD  Result Value Ref Range Status   Specimen Description BLOOD RIGHT ANTECUBITAL  Final   Special  Requests   Final    BOTTLES DRAWN AEROBIC AND ANAEROBIC Blood Culture adequate volume   Culture   Final    NO GROWTH 2 DAYS Performed at [facility], [place], [place] [address]    Report Status PENDING  Incomplete    Labs: CBC: Recent Labs  Lab 06/28/22 0815 06/29/22 0358 06/30/22 0614  WBC 12.0* 15.1* 8.9  NEUTROABS 8.3* 13.2*  --   HGB 15.7 14.1 11.8*  HCT 48.1 42.8 35.9*  MCV 95.1 94.9 92.8  PLT 162 141* 676*   Basic Metabolic Panel:  Recent Labs  Lab 06/28/22 0815 06/29/22 0358 06/30/22 0614  NA 138 135 137  K 3.7 3.6 3.8  CL 103 104 111  CO2 28 20* 17*  GLUCOSE 103* 125* 101*  BUN 24* 24* 15  CREATININE 0.94 0.99 0.68  CALCIUM 9.5 8.4* 8.1*   Liver Function Tests: Recent Labs  Lab 06/28/22 0815 06/29/22 0358  AST 20 27  ALT 45* 39  ALKPHOS 62 59  BILITOT 1.6* 1.8*  PROT 7.7 6.7  ALBUMIN 4.3 3.9   CBG: Recent Labs  Lab 06/30/22 0730 07/01/22 0815 07/01/22 1114  GLUCAP 100* 85 91    Discharge time spent: greater than 30 minutes.  This record has been created using Systems analyst. Errors have been sought and corrected,but may not always be located. Such creation errors do not reflect on the standard of care.   Signed: Lorella Nimrod, MD [facility] 07/01/2022

## 2022-07-01 NOTE — Progress Notes (Signed)
[facility]) Hospice hospital liaison note   Referral received for Hospice Home.    Talked with family at bedside and outside room. Mr. Sedore is appropriate for transfer to the hospice home and is currently waiting on transport. Please leave any IVs in place.    RN staff, you may call report at any time to [phone], room is assigned when report is called.   Please send completed DNR with patient.   Updated attending and [facility] manager via [place]. Thank you for the opportunity to participate in this patient's care Jhonnie Garner, BSN, RN Hospice nurse liaison [phone]

## 2022-07-03 ENCOUNTER — Other Ambulatory Visit: Payer: Self-pay | Admitting: Internal Medicine

## 2022-07-03 LAB — COMP PANEL: LEUKEMIA/LYMPHOMA

## 2022-07-03 LAB — CSF CULTURE W GRAM STAIN: Culture: NO GROWTH

## 2022-07-04 LAB — CULTURE, BLOOD (ROUTINE X 2)
Culture: NO GROWTH
Culture: NO GROWTH
Special Requests: ADEQUATE
Special Requests: ADEQUATE

## 2022-07-05 ENCOUNTER — Other Ambulatory Visit: Payer: Self-pay | Admitting: Internal Medicine

## 2022-07-05 LAB — BLOOD GAS, ARTERIAL
Acid-base deficit: 2.3 mmol/L — ABNORMAL HIGH (ref 0.0–2.0)
Bicarbonate: 22.5 mmol/L (ref 20.0–28.0)
O2 Saturation: 50.3 %
Patient temperature: 37
pCO2 arterial: 38 mmHg (ref 32–48)
pH, Arterial: 7.38 (ref 7.35–7.45)
pO2, Arterial: 39 mmHg — CL (ref 83–108)

## 2022-07-07 ENCOUNTER — Ambulatory Visit: Admission: RE | Admit: 2022-07-07 | Payer: BC Managed Care – PPO | Source: Ambulatory Visit

## 2022-07-07 ENCOUNTER — Encounter: Payer: Self-pay | Admitting: Internal Medicine

## 2022-07-17 ENCOUNTER — Ambulatory Visit: Payer: BC Managed Care – PPO

## 2022-07-17 ENCOUNTER — Ambulatory Visit: Payer: BC Managed Care – PPO | Admitting: Internal Medicine

## 2022-07-17 ENCOUNTER — Other Ambulatory Visit: Payer: BC Managed Care – PPO

## 2022-07-18 ENCOUNTER — Inpatient Hospital Stay: Payer: BC Managed Care – PPO | Admitting: Internal Medicine

## 2022-07-18 ENCOUNTER — Inpatient Hospital Stay: Payer: BC Managed Care – PPO

## 2022-07-20 LAB — CULTURE, FUNGUS WITHOUT SMEAR

## 2022-07-23 NOTE — Telephone Encounter (Signed)
Component Ref Range & Units 3 d ago (06/30/22) 4 d ago (06/29/22) 5 d ago (06/28/22) 10 d ago (06/23/22) 4 wk ago (06/05/22) 1 mo ago (06/02/22) 1 mo ago (05/23/22)  Potassium 3.5 - 5.1 mmol/L 3.8  3.6  3.7  3.9  3.2 Low

## 2022-07-23 DEATH — deceased

## 2022-08-07 ENCOUNTER — Ambulatory Visit: Payer: BC Managed Care – PPO | Admitting: Internal Medicine

## 2022-08-07 ENCOUNTER — Other Ambulatory Visit: Payer: BC Managed Care – PPO

## 2022-08-07 ENCOUNTER — Ambulatory Visit: Payer: BC Managed Care – PPO

## 2022-08-08 ENCOUNTER — Ambulatory Visit: Payer: BC Managed Care – PPO | Admitting: Internal Medicine

## 2022-08-08 ENCOUNTER — Other Ambulatory Visit: Payer: BC Managed Care – PPO

## 2022-08-08 ENCOUNTER — Ambulatory Visit: Payer: BC Managed Care – PPO

## 2022-08-14 ENCOUNTER — Telehealth: Payer: BC Managed Care – PPO | Admitting: Hospice and Palliative Medicine

## 2022-09-19 ENCOUNTER — Ambulatory Visit: Payer: BC Managed Care – PPO | Admitting: Internal Medicine

## 2022-09-19 ENCOUNTER — Other Ambulatory Visit: Payer: BC Managed Care – PPO

## 2022-09-19 ENCOUNTER — Ambulatory Visit: Payer: BC Managed Care – PPO

## 2024-03-15 IMAGING — MR MR HEAD WO/W CM
15 series · 48 of 48 positions shown · IV contrast (6ml Gadavist)
Comparison: None.

CLINICAL DATA: Small-cell lung cancer, staging

EXAM:
MRI HEAD WITHOUT AND WITH CONTRAST
TECHNIQUE: Multiplanar, multiecho pulse sequences of the brain and surrounding
structures were obtained without and with intravenous contrast.
CONTRAST:  6mL GADAVIST GADOBUTROL 1 MMOL/ML IV SOLN

[Series 5: ax dwi_tracew · axial · 3.0mm · 0.65mm/px · z∈[-130,+18]mm · 4 of 48 slices shown]
[im 1/48]
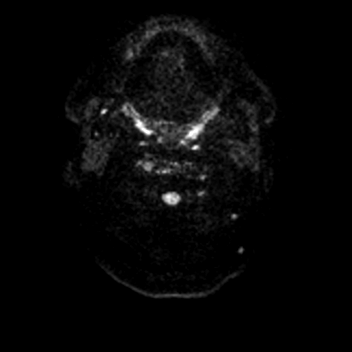
[im 16/48]
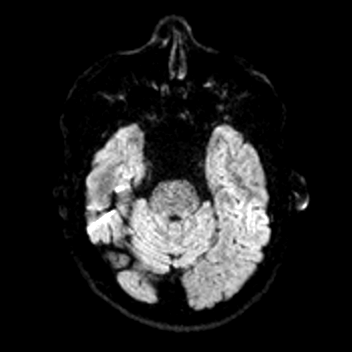
[im 32/48]
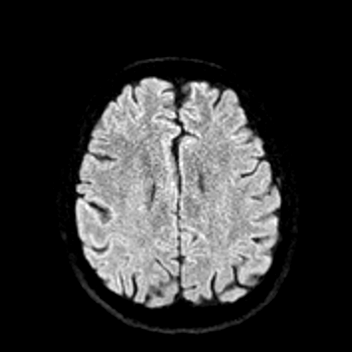
[im 48/48]
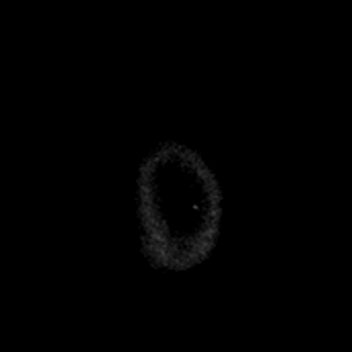

[Series 6: ax dwi_adc · axial · 3.0mm · 0.65mm/px · z∈[-130,+18]mm · 3 of 48 slices shown]
[im 1/48]
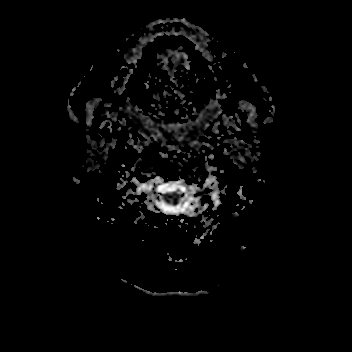
[im 24/48]
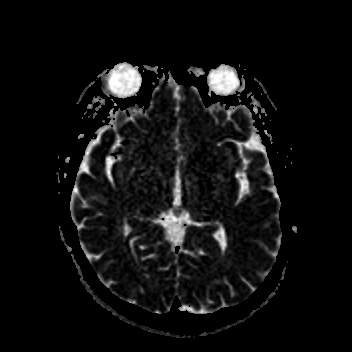
[im 48/48]
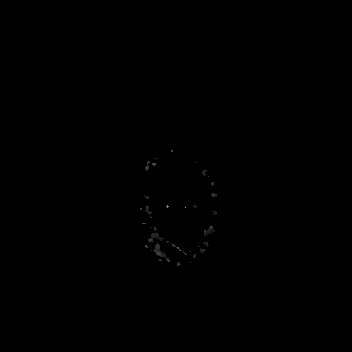

[Series 7: cor dwi_tracew · coronal · 5.0mm · 0.65mm/px · 2 of 36 slices shown]
[im 1/36]
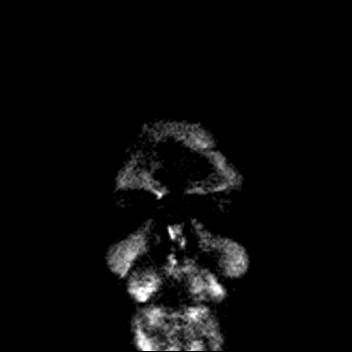
[im 36/36]
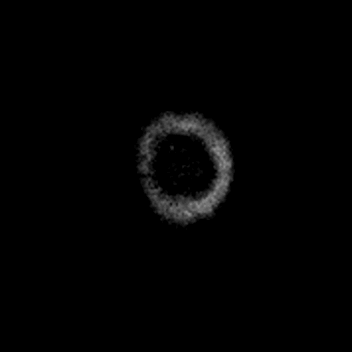

[Series 8: cor dwi_adc · coronal · 5.0mm · 0.65mm/px · 2 of 36 slices shown]
[im 1/36]
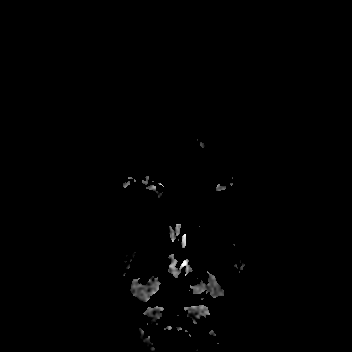
[im 36/36]
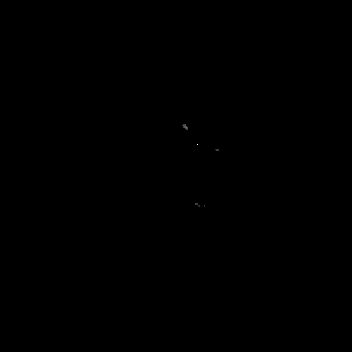

[Series 9: T1 · sagittal · 5.0mm · 0.62mm/px · 1 of 23 slices shown (1 of 2)]
[im 1/23]
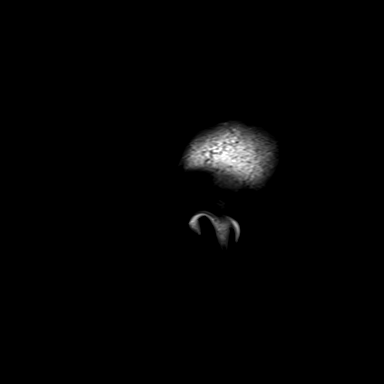

[Series 10: T2 · axial · 5.0mm · 0.53mm/px · 1 of 25 slices shown]
[im 1/25]
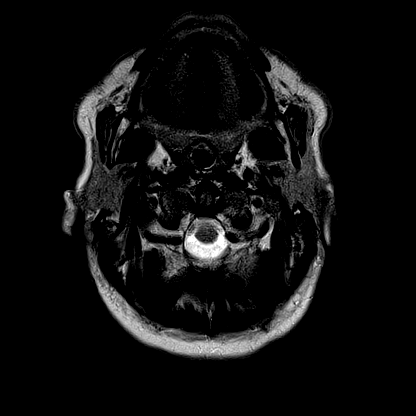

[Series 11: mag_images · axial · 3.0mm · 0.90mm/px · z∈[-141,+29]mm · 3 of 60 slices shown]
[im 1/60]
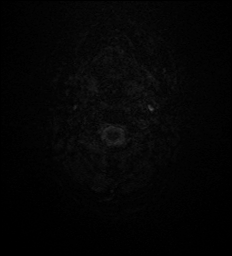
[im 30/60]
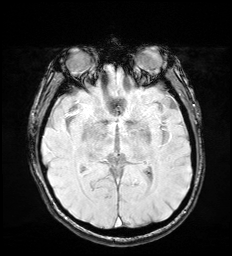
[im 60/60]
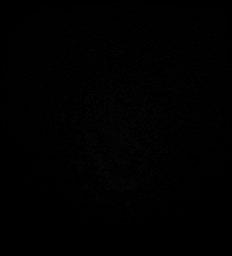

[Series 12: pha_images · axial · 3.0mm · 0.90mm/px · z∈[-141,+26]mm · 3 of 58 slices shown]
[im 1/58]
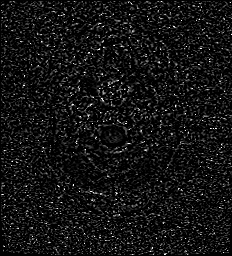
[im 29/58]
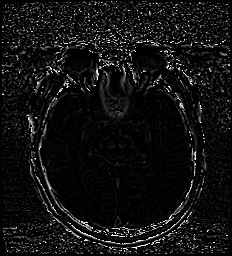
[im 58/58]
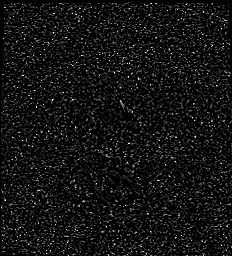

[Series 13: swi_images · axial · 3.0mm · 0.90mm/px · z∈[-141,+29]mm · 3 of 60 slices shown]
[im 1/60]
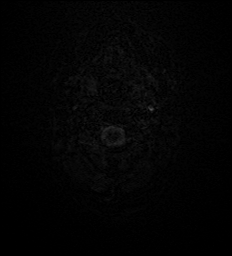
[im 30/60]
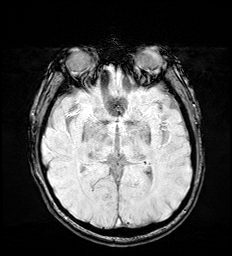
[im 60/60]
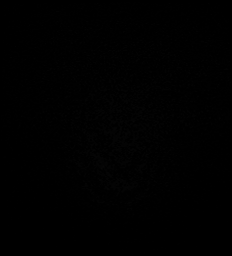

[Series 15: FLAIR · axial · 3.0mm · 0.53mm/px · z∈[-132,+23]mm · 3 of 55 slices shown]
[im 1/55]
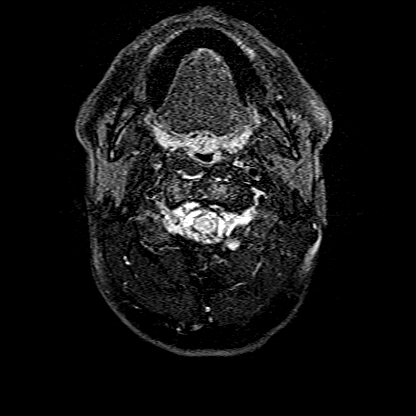
[im 28/55]
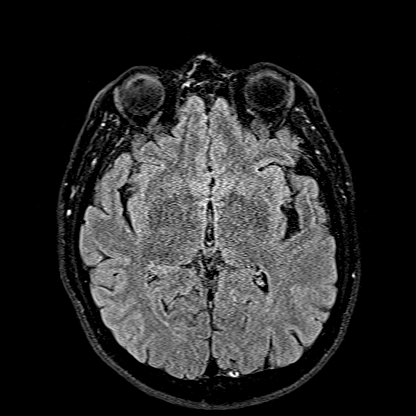
[im 55/55]
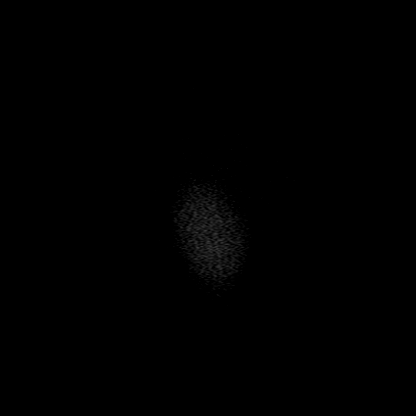

[Series 16: T1 · axial · 1.0mm · 0.98mm/px · z∈[-135,+17]mm · 9 of 160 slices shown (2 of 2)]
[im 1/160]
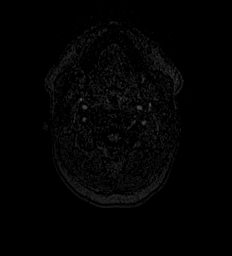
[im 20/160]
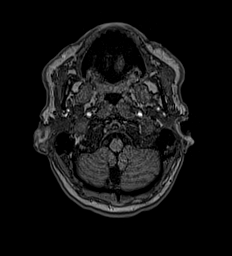
[im 40/160]
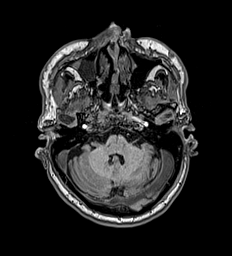
[im 60/160]
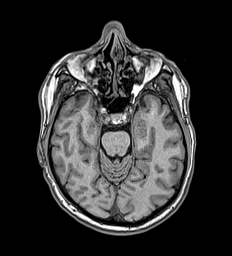
[im 80/160]
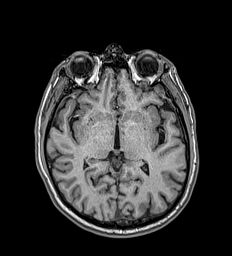
[im 100/160]
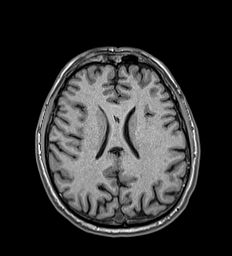
[im 120/160]
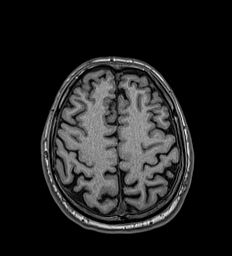
[im 140/160]
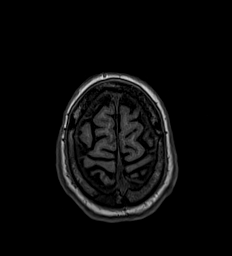
[im 160/160]
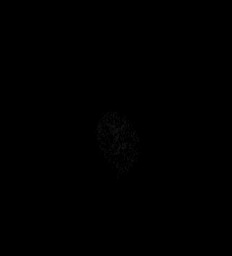

[Series 17: T2 post-contrast · coronal · 5.0mm · 0.57mm/px · 2 of 29 slices shown]
[im 1/29]
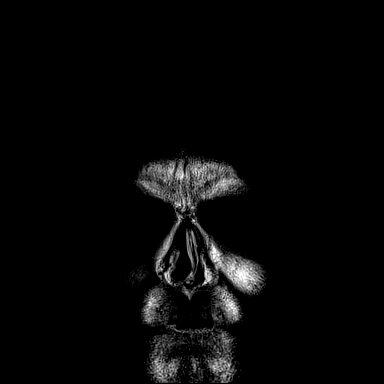
[im 29/29]
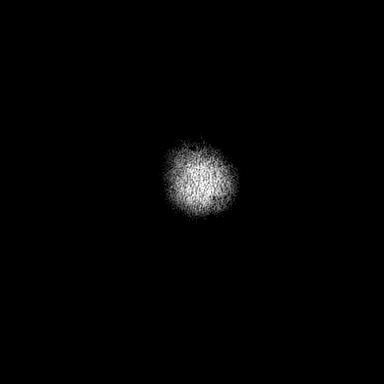

[Series 18: T1 post-contrast · axial · 1.0mm · 0.98mm/px · z∈[-135,+17]mm · 9 of 160 slices shown (1 of 3)]
[im 1/160]
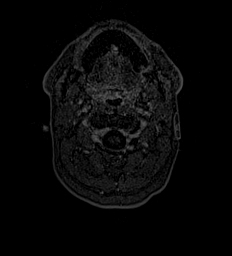
[im 20/160]
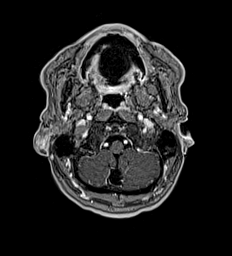
[im 40/160]
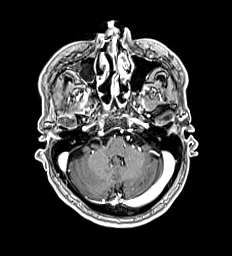
[im 60/160]
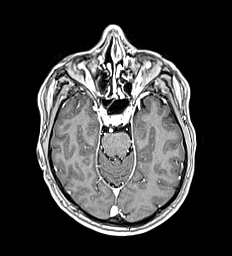
[im 80/160]
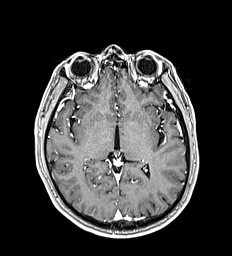
[im 100/160]
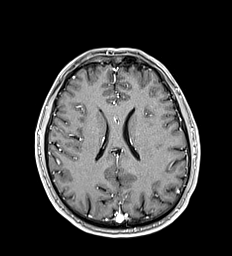
[im 120/160]
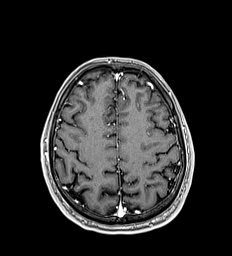
[im 140/160]
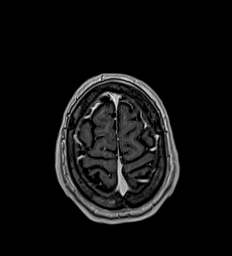
[im 160/160]
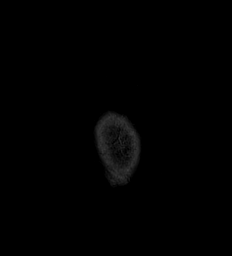

[Series 19: T1 post-contrast · coronal · 5.0mm · 0.57mm/px · 2 of 29 slices shown (2 of 3)]
[im 1/29]
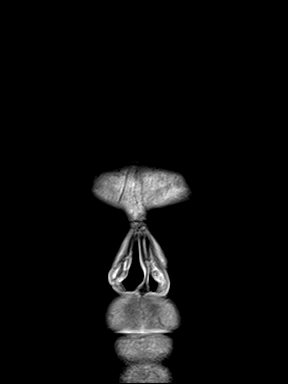
[im 29/29]
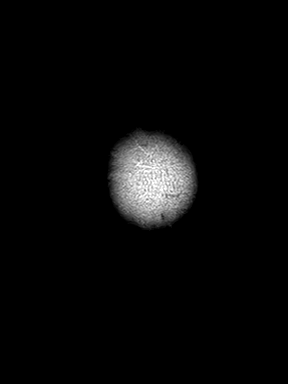

[Series 20: T1 post-contrast · sagittal · 5.0mm · 0.62mm/px · 1 of 23 slices shown (3 of 3)]
[im 1/23]
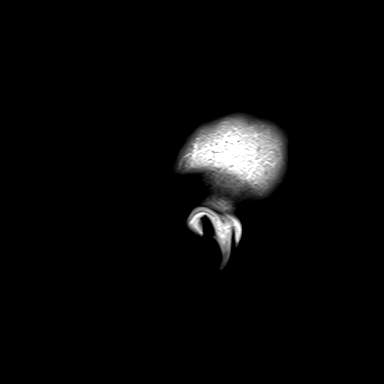

[48 of 48 positions shown; findings below may reference images not displayed]

FINDINGS: Brain: There is a 4 mm enhancing lesion of the parasagittal inferior
left cerebellum (series 18, image 27). A second 4 mm lesion is
present at the same level within the lateral right cerebellum.
Punctate foci of enhancement on the left on images 30 and 39. There
is no edema.

There is no acute infarction or intracranial hemorrhage. There is no
hydrocephalus or extra-axial fluid collection. Ventricles and sulci
are normal in size and configuration.

Vascular: Major vessel flow voids at the skull base are preserved.

Skull and upper cervical spine: Focal abnormal marrow signal within
the posterior right parietal calvarium. Abnormal marrow signal at C4
is partially imaged.

Sinuses/Orbits: Polypoid paranasal sinus mucosal thickening. Orbits
are unremarkable.

Other: Sella is unremarkable. Mastoid air cells are clear.
Indeterminate small right periauricular lymph node posterior to the
parotid (series 18, image 12).
IMPRESSION: At least two small cerebellar metastases as described.  No edema.

Right parietal calvarium metastasis.

C4 vertebral body metastasis.

Nonspecific right periauricular lymph node posterior to the parotid.
This was hypermetabolic on PET suggesting nodal metastasis.
